# Patient Record
Sex: Female | Born: 1945 | Race: White | Hispanic: No | Smoking: Never smoker
Health system: Southern US, Community
[De-identification: ages and names within clinical notes are randomized; demographics above are authoritative.]

## PROBLEM LIST (undated history)

## (undated) DIAGNOSIS — J45909 Unspecified asthma, uncomplicated: Secondary | ICD-10-CM

## (undated) DIAGNOSIS — E538 Deficiency of other specified B group vitamins: Secondary | ICD-10-CM

## (undated) DIAGNOSIS — I639 Cerebral infarction, unspecified: Secondary | ICD-10-CM

## (undated) DIAGNOSIS — C7A090 Malignant carcinoid tumor of the bronchus and lung: Secondary | ICD-10-CM

## (undated) DIAGNOSIS — F419 Anxiety disorder, unspecified: Secondary | ICD-10-CM

## (undated) DIAGNOSIS — R112 Nausea with vomiting, unspecified: Secondary | ICD-10-CM

## (undated) DIAGNOSIS — Z8619 Personal history of other infectious and parasitic diseases: Secondary | ICD-10-CM

## (undated) DIAGNOSIS — K56609 Unspecified intestinal obstruction, unspecified as to partial versus complete obstruction: Secondary | ICD-10-CM

## (undated) DIAGNOSIS — R918 Other nonspecific abnormal finding of lung field: Secondary | ICD-10-CM

## (undated) DIAGNOSIS — K5289 Other specified noninfective gastroenteritis and colitis: Secondary | ICD-10-CM

## (undated) DIAGNOSIS — Z9889 Other specified postprocedural states: Secondary | ICD-10-CM

## (undated) DIAGNOSIS — K219 Gastro-esophageal reflux disease without esophagitis: Secondary | ICD-10-CM

## (undated) DIAGNOSIS — Z992 Dependence on renal dialysis: Secondary | ICD-10-CM

## (undated) DIAGNOSIS — C569 Malignant neoplasm of unspecified ovary: Secondary | ICD-10-CM

## (undated) DIAGNOSIS — N390 Urinary tract infection, site not specified: Secondary | ICD-10-CM

## (undated) DIAGNOSIS — N289 Disorder of kidney and ureter, unspecified: Secondary | ICD-10-CM

## (undated) DIAGNOSIS — K8050 Calculus of bile duct without cholangitis or cholecystitis without obstruction: Secondary | ICD-10-CM

## (undated) HISTORY — DX: Unspecified intestinal obstruction, unspecified as to partial versus complete obstruction: K56.609

## (undated) HISTORY — DX: Malignant neoplasm of unspecified ovary: C56.9

## (undated) HISTORY — PX: OTHER SURGICAL HISTORY: SHX169

## (undated) HISTORY — DX: Cerebral infarction, unspecified: I63.9

## (undated) HISTORY — PX: EXPLORATORY LAPAROTOMY W/ BOWEL RESECTION: SHX1544

## (undated) HISTORY — PX: LAPAROSCOPIC CHOLECYSTECTOMY: SUR755

## (undated) HISTORY — PX: TOTAL ABDOMINAL HYSTERECTOMY W/ BILATERAL SALPINGOOPHORECTOMY: SHX83

## (undated) HISTORY — PX: LUNG REMOVAL, PARTIAL: SHX233

## (undated) HISTORY — DX: Deficiency of other specified B group vitamins: E53.8

## (undated) HISTORY — DX: Anxiety disorder, unspecified: F41.9

## (undated) HISTORY — DX: Other specified noninfective gastroenteritis and colitis: K52.89

## (undated) HISTORY — PX: APPENDECTOMY: SHX54

## (undated) HISTORY — DX: Calculus of bile duct without cholangitis or cholecystitis without obstruction: K80.50

## (undated) HISTORY — DX: Gastro-esophageal reflux disease without esophagitis: K21.9

## (undated) HISTORY — PX: BREAST ENHANCEMENT SURGERY: SHX7

## (undated) HISTORY — DX: Personal history of other infectious and parasitic diseases: Z86.19

## (undated) HISTORY — DX: Other nonspecific abnormal finding of lung field: R91.8

## (undated) HISTORY — DX: Malignant carcinoid tumor of the bronchus and lung: C7A.090

---

## 2000-05-02 ENCOUNTER — Ambulatory Visit: Admission: RE | Admit: 2000-05-02 | Discharge: 2000-05-02 | Payer: Self-pay | Admitting: Gynecology

## 2000-05-02 ENCOUNTER — Other Ambulatory Visit: Admission: RE | Admit: 2000-05-02 | Discharge: 2000-05-02 | Payer: Self-pay | Admitting: Gynecology

## 2000-05-19 ENCOUNTER — Inpatient Hospital Stay (HOSPITAL_COMMUNITY): Admission: EM | Admit: 2000-05-19 | Discharge: 2000-05-20 | Payer: Self-pay | Admitting: Emergency Medicine

## 2000-05-19 ENCOUNTER — Encounter: Payer: Self-pay | Admitting: Emergency Medicine

## 2000-05-20 ENCOUNTER — Encounter: Payer: Self-pay | Admitting: Internal Medicine

## 2000-06-01 ENCOUNTER — Ambulatory Visit (HOSPITAL_COMMUNITY): Admission: RE | Admit: 2000-06-01 | Discharge: 2000-06-01 | Payer: Self-pay | Admitting: Internal Medicine

## 2000-06-01 ENCOUNTER — Encounter: Payer: Self-pay | Admitting: Internal Medicine

## 2000-06-14 ENCOUNTER — Encounter: Payer: Self-pay | Admitting: Internal Medicine

## 2000-06-14 ENCOUNTER — Ambulatory Visit (HOSPITAL_COMMUNITY): Admission: RE | Admit: 2000-06-14 | Discharge: 2000-06-14 | Payer: Self-pay | Admitting: Internal Medicine

## 2000-06-20 ENCOUNTER — Ambulatory Visit: Admission: RE | Admit: 2000-06-20 | Discharge: 2000-06-20 | Payer: Self-pay | Admitting: Gynecology

## 2000-09-12 ENCOUNTER — Ambulatory Visit: Admission: RE | Admit: 2000-09-12 | Discharge: 2000-09-12 | Payer: Self-pay | Admitting: Gynecology

## 2000-11-14 ENCOUNTER — Ambulatory Visit: Admission: RE | Admit: 2000-11-14 | Discharge: 2000-11-14 | Payer: Self-pay | Admitting: Gynecology

## 2001-01-30 DIAGNOSIS — K5289 Other specified noninfective gastroenteritis and colitis: Secondary | ICD-10-CM

## 2001-01-30 HISTORY — DX: Other specified noninfective gastroenteritis and colitis: K52.89

## 2001-02-15 ENCOUNTER — Encounter: Payer: Self-pay | Admitting: Internal Medicine

## 2001-02-18 ENCOUNTER — Encounter (INDEPENDENT_AMBULATORY_CARE_PROVIDER_SITE_OTHER): Payer: Self-pay | Admitting: *Deleted

## 2001-02-18 ENCOUNTER — Other Ambulatory Visit: Admission: RE | Admit: 2001-02-18 | Discharge: 2001-02-18 | Payer: Self-pay | Admitting: Internal Medicine

## 2001-02-21 ENCOUNTER — Ambulatory Visit (HOSPITAL_COMMUNITY): Admission: RE | Admit: 2001-02-21 | Discharge: 2001-02-21 | Payer: Self-pay | Admitting: Internal Medicine

## 2001-02-21 ENCOUNTER — Encounter: Payer: Self-pay | Admitting: Internal Medicine

## 2001-02-26 ENCOUNTER — Ambulatory Visit: Admission: RE | Admit: 2001-02-26 | Discharge: 2001-02-26 | Payer: Self-pay | Admitting: Gynecology

## 2001-03-05 ENCOUNTER — Encounter: Admission: RE | Admit: 2001-03-05 | Discharge: 2001-05-23 | Payer: Self-pay | Admitting: Gynecology

## 2001-08-15 ENCOUNTER — Encounter: Payer: Self-pay | Admitting: Internal Medicine

## 2001-08-15 ENCOUNTER — Ambulatory Visit (HOSPITAL_COMMUNITY): Admission: RE | Admit: 2001-08-15 | Discharge: 2001-08-15 | Payer: Self-pay | Admitting: Internal Medicine

## 2001-08-19 ENCOUNTER — Ambulatory Visit (HOSPITAL_COMMUNITY): Admission: RE | Admit: 2001-08-19 | Discharge: 2001-08-19 | Payer: Self-pay | Admitting: Internal Medicine

## 2004-10-05 ENCOUNTER — Ambulatory Visit: Payer: Self-pay | Admitting: Internal Medicine

## 2006-11-07 ENCOUNTER — Ambulatory Visit: Payer: Self-pay | Admitting: Internal Medicine

## 2006-11-07 LAB — CONVERTED CEMR LAB
ALT: 44 units/L — ABNORMAL HIGH (ref 0–40)
AST: 25 units/L (ref 0–37)
Albumin: 3.7 g/dL (ref 3.5–5.2)
Alkaline Phosphatase: 78 units/L (ref 39–117)
BUN: 17 mg/dL (ref 6–23)
Basophils Absolute: 0.1 10*3/uL (ref 0.0–0.1)
Basophils Relative: 1.1 % — ABNORMAL HIGH (ref 0.0–1.0)
Bilirubin, Direct: 0.2 mg/dL (ref 0.0–0.3)
CO2: 26 meq/L (ref 19–32)
Calcium: 9 mg/dL (ref 8.4–10.5)
Chloride: 107 meq/L (ref 96–112)
Creatinine, Ser: 1.2 mg/dL (ref 0.4–1.2)
Eosinophils Absolute: 0.1 10*3/uL (ref 0.0–0.6)
Eosinophils Relative: 1.4 % (ref 0.0–5.0)
GFR calc Af Amer: 59 mL/min
GFR calc non Af Amer: 49 mL/min
Glucose, Bld: 89 mg/dL (ref 70–99)
HCT: 38.4 % (ref 36.0–46.0)
Hemoglobin: 13.2 g/dL (ref 12.0–15.0)
Lymphocytes Relative: 35.9 % (ref 12.0–46.0)
MCHC: 34.5 g/dL (ref 30.0–36.0)
MCV: 90.2 fL (ref 78.0–100.0)
Monocytes Absolute: 0.4 10*3/uL (ref 0.2–0.7)
Monocytes Relative: 5.2 % (ref 3.0–11.0)
Neutro Abs: 4.3 10*3/uL (ref 1.4–7.7)
Neutrophils Relative %: 56.4 % (ref 43.0–77.0)
Platelets: 325 10*3/uL (ref 150–400)
Potassium: 4.1 meq/L (ref 3.5–5.1)
RBC: 4.25 M/uL (ref 3.87–5.11)
RDW: 13 % (ref 11.5–14.6)
Sodium: 139 meq/L (ref 135–145)
TSH: 1.76 microintl units/mL (ref 0.35–5.50)
Total Bilirubin: 0.7 mg/dL (ref 0.3–1.2)
Total Protein: 6.7 g/dL (ref 6.0–8.3)
Vitamin B-12: 488 pg/mL (ref 211–911)
WBC: 7.6 10*3/uL (ref 4.5–10.5)

## 2007-05-16 ENCOUNTER — Ambulatory Visit: Payer: Self-pay | Admitting: Internal Medicine

## 2007-05-16 LAB — CONVERTED CEMR LAB
BUN: 17 mg/dL (ref 6–23)
CO2: 24 meq/L (ref 19–32)
Calcium: 9.3 mg/dL (ref 8.4–10.5)
Chloride: 109 meq/L (ref 96–112)
Creatinine, Ser: 1.3 mg/dL — ABNORMAL HIGH (ref 0.4–1.2)
GFR calc Af Amer: 54 mL/min
GFR calc non Af Amer: 44 mL/min
Glucose, Bld: 114 mg/dL — ABNORMAL HIGH (ref 70–99)
Potassium: 4.2 meq/L (ref 3.5–5.1)
Sodium: 140 meq/L (ref 135–145)

## 2008-03-06 ENCOUNTER — Encounter: Payer: Self-pay | Admitting: Internal Medicine

## 2008-05-18 ENCOUNTER — Telehealth: Payer: Self-pay | Admitting: Internal Medicine

## 2008-07-31 ENCOUNTER — Ambulatory Visit: Admission: RE | Admit: 2008-07-31 | Discharge: 2008-07-31 | Payer: Self-pay | Admitting: Gynecology

## 2008-07-31 ENCOUNTER — Encounter (INDEPENDENT_AMBULATORY_CARE_PROVIDER_SITE_OTHER): Payer: Self-pay | Admitting: *Deleted

## 2008-07-31 ENCOUNTER — Encounter: Payer: Self-pay | Admitting: Internal Medicine

## 2008-10-19 ENCOUNTER — Telehealth: Payer: Self-pay | Admitting: Internal Medicine

## 2008-11-11 DIAGNOSIS — K8020 Calculus of gallbladder without cholecystitis without obstruction: Secondary | ICD-10-CM | POA: Insufficient documentation

## 2008-11-11 DIAGNOSIS — Z8543 Personal history of malignant neoplasm of ovary: Secondary | ICD-10-CM

## 2008-11-11 DIAGNOSIS — Z8719 Personal history of other diseases of the digestive system: Secondary | ICD-10-CM

## 2008-11-11 DIAGNOSIS — R197 Diarrhea, unspecified: Secondary | ICD-10-CM

## 2008-11-11 DIAGNOSIS — E538 Deficiency of other specified B group vitamins: Secondary | ICD-10-CM | POA: Insufficient documentation

## 2008-11-24 ENCOUNTER — Ambulatory Visit: Payer: Self-pay | Admitting: Internal Medicine

## 2008-11-26 ENCOUNTER — Telehealth: Payer: Self-pay | Admitting: Internal Medicine

## 2009-06-02 ENCOUNTER — Telehealth: Payer: Self-pay | Admitting: Internal Medicine

## 2009-08-11 ENCOUNTER — Encounter: Payer: Self-pay | Admitting: Internal Medicine

## 2010-03-22 ENCOUNTER — Telehealth: Payer: Self-pay | Admitting: Internal Medicine

## 2010-05-13 ENCOUNTER — Encounter: Payer: Self-pay | Admitting: Internal Medicine

## 2010-11-03 NOTE — Progress Notes (Signed)
Summary: Medication refill  Phone Note Call from Patient Call back at Home Phone [phone]   Caller: Patient Call For: Dr. Olevia Perches Reason for Call: Talk to Nurse Summary of Call: pt. is requesting a refill on her Cipro and Cyanocobalamin. She stated that last time she requested a refill that it was denied b/c she "is unemployed with no insurance and cannot afford a colonoscopy." Patient very angry. Initial call taken by: Webb Laws,  March 22, 2010 12:04 PM  Follow-up for Phone Call        Actually, looks like we did fill her medication and Dr Olevia Perches asked that she have a colonoscopy before the end of the year due to her increased risk for colon cancer due to radiation and complex medical history. So, I am unsure as to what medication denial she is referring to.   Dr Olevia Perches, please see note dated 11/26/08 and 08/11/09.Marland KitchenMarland KitchenMarland KitchenMarland Kitchenpatient's last office visit with [place] was on 11/24/08. Do you want me to continue giving medications even though patient is yet to have colonoscopy?  Follow-up by: Madlyn Frankel CMA Deborra Medina),  March 22, 2010 1:37 PM  Additional Follow-up for Phone Call Additional follow up Details #1::        OK to refill x 3 Additional Follow-up by: Lafayette Dragon MD,  March 23, 2010 10:22 PM    New/Updated Medications: CYANOCOBALAMIN 1000 MCG/ML SOLN (CYANOCOBALAMIN) Administer 1 ml intramuscularly once a month Prescriptions: CYANOCOBALAMIN 1000 MCG/ML SOLN (CYANOCOBALAMIN) Administer 1 ml intramuscularly once a month  #3 ml x 0   Entered by:   Madlyn Frankel CMA (AAMA)   Authorized by:   Lafayette Dragon MD   Signed by:   [place] ([place]) on 03/25/2010   Method used:   Electronically to        [facility] (retail)       [address] [place], [place]  [address]       Ph: [phone]       Fax: [phone]   RxID:   [id number] CIPRO 250 MG TABS (CIPROFLOXACIN HCL) Take 1 tablet by mouth two times a  day x 7 days (as directed)  #14 x 2   Entered by:   Madlyn Frankel CMA (AAMA)   Authorized by:   Lafayette Dragon MD   Signed by:   Madlyn Frankel CMA ([place]) on 03/25/2010   Method used:   Electronically to        [facility] (retail)       [place]       [place],   [address]       Ph: [phone]       Fax: [phone]   RxID:   [id number]

## 2010-11-03 NOTE — Miscellaneous (Signed)
Summary: Cholestyramine Refills  Clinical Lists Changes  Medications: Changed medication from QUESTRAN 4 GM PACK (CHOLESTYRAMINE) Dissolve in 8 ounces of water and drink every morning.  Take this at least 2 hours apart from other medications. to QUESTRAN 4 GM/DOSE POWD (CHOLESTYRAMINE) Dissolve in 8 ounces of water and drink every morning. Take 2 hours apart from all other medications. NEED OFFICE VISIT FOR FURTHER REFILLS! - Signed Rx of QUESTRAN 4 GM/DOSE POWD (CHOLESTYRAMINE) Dissolve in 8 ounces of water and drink every morning. Take 2 hours apart from all other medications. NEED OFFICE VISIT FOR FURTHER REFILLS!;  #1 can x 0;  Signed;  Entered by: Madlyn Frankel CMA (AAMA);  Authorized by: Lafayette Dragon MD;  Method used: Electronically to [facility] [place], [place], [place]  [address], Ph: [phone], Fax: [phone]    Prescriptions: QUESTRAN 4 GM/DOSE POWD (CHOLESTYRAMINE) Dissolve in 8 ounces of water and drink every morning. Take 2 hours apart from all other medications. NEED OFFICE VISIT FOR FURTHER REFILLS!  #1 can x 0   Entered by:   Madlyn Frankel CMA (AAMA)   Authorized by:   Lafayette Dragon MD   Signed by:   [place] ([place]) on 05/13/2010   Method used:   Electronically to        [facility] (retail)       [address]       [place], [place]  [address]       Ph: [phone]       Fax: [phone]   RxID:   [id number]

## 2010-11-18 ENCOUNTER — Telehealth: Payer: Self-pay | Admitting: Internal Medicine

## 2010-11-29 NOTE — Progress Notes (Signed)
Summary: Question about her prescription  Phone Note Call from Patient Call back at Home Phone [phone]   Call For: Dr Olevia Perches Action Taken: Appt Scheduled Summary of Call: Question about her prescriptions. Willing to speak to nurse if Dr Olevia Perches is too busy & cant call her back now Initial call taken by: Irwin Brakeman [facility],  November 18, 2010 11:23 AM  Follow-up for Phone Call        I have spoken to patient. She states that she is in a difficult financial position right now and is unable to afford an office visit and unable to afford her cholestyramine ($65.00 cost). She states that she is having severe diarrhea without the cholecystramine. Suggested she try immodium for the time being. She states that she has been taking immodium without any improvement in her symptoms. She would like to know if Dr Olevia Perches has any suggestions for cheaper alternatives or natural alternatives. Please advise. Follow-up by: Madlyn Frankel CMA Deborra Medina),  November 18, 2010 12:12 PM  Additional Follow-up for Phone Call Additional follow up Details #1::        anything in large amount OTC willbe expensive. peptobismol, Kaopectate, activated charcoal, all there may be tried.  Additional Follow-up by: Lafayette Dragon MD,  November 19, 2010 12:00 PM    Additional Follow-up for Phone Call Additional follow up Details #2::    left message on voicemail that patient may try otc kaopectacte, pepto bismol or activated charcoal although these may be expensive over the counter as well. Patient to call back with any questions. Follow-up by: Madlyn Frankel CMA Deborra Medina),  November 21, 2010 1:55 PM

## 2011-02-14 NOTE — Consult Note (Signed)
NAMEANTONAE, PORTS NO.:  [id number]   MEDICAL RECORD NO.:  [id number]          PATIENT TYPE:  OUT   LOCATION:  GYN                          FACILITY:  [facility]   PHYSICIAN:  Marti Sleigh, M.D.DATE OF BIRTH:  January 21, 1946   DATE OF CONSULTATION:  DATE OF DISCHARGE:                                 CONSULTATION   CHIEF COMPLAINT:  Chronic diarrhea.   A 65 year old white female seen in follow-up.  She has not been seen in  our clinic since 2005.  She returns today because she has insurance and  seeks continuing management of her diarrhea.   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  Postoperatively, she  was treated with whole abdomen and pelvic radiation therapy.  She has  remained with no evidence of disease, but since the 1980s, she has had  partial small-bowel obstructions.  She was admitted on several  occasions.  While she was managed conservatively, she worsened and  ultimately had obstruction and significant weight loss.  In December  2001, she underwent exploratory laparotomy with resection of the distal  small bowel with primary reanastomosis of the small bowel to the  ascending colon.  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  There was no evidence of cancer.   Since that time, she has had diarrhea and uses Lomotil on a daily basis,  as well as Questran.  She has modified her diet somewhat and finds that  she has a least amount of diarrhea when she eats a lot of [place] bread.   PAST SURGICAL HISTORY:  1. TAH-BSO.  2. Appendectomy.  3. Laparoscopic cholecystectomy in 1998.  4. Bilateral breast implants in 1984.  5. Exploratory laparotomy and small bowel resection in 2001.   FAMILY HISTORY:  Negative for gynecologic breast or colon cancer.   SOCIAL HISTORY:  The patient works in Aeronautical engineer.  She does  not smoke and is not  married.   DRUG ALLERGIES:  NONE.   CURRENT MEDICATIONS:  Lomotil and Questran.   REVIEW OF SYSTEMS:  A 10-point comprehensive review of systems negative  except as noted above.   PHYSICAL EXAMINATION:  VITAL SIGNS:  Weight 136 pounds.  GENERAL:  The patient is a healthy white female in no acute distress.  HEENT:  Negative.  NECK:  Supple without thyromegaly.  There is no supraclavicular or  inguinal adenopathy.  ABDOMEN:  Soft and nontender.  No mass, organomegaly, ascites or hernias  noted.  PELVIC:  EG/BUS, vagina and urethra are normal.  Cervix and uterus  surgically absent.  There is some radiation changes and fibrosis in the  pelvis and fibrosis of the mons and lower abdomen, consistent with  radiation changes as well.  Rectovaginal exam confirms.  The patient has  some hemorrhoids.  EXTREMITIES:  Lower extremities without edema or varicosities.   IMPRESSION:  1. Chronic diarrhea, ranging from 6-20 episodes a day.  She is      somewhat better when she  takes Lomotil or eat white bread.  She has      restricted her diet and is not eating green vegetables, fried foods      of milk products.  2. She also has some menopausal symptoms.   RECOMMENDATIONS:  I suggest the patient have a bone density study.  She  is interested in reinitiating hormone replacement therapy and she was  given a prescription for a Vivelle dot 0.1 mg to be changed twice a  week.  She has annual mammograms which she claims are negative.   With regard to her diarrhea, I think there are several approaches that  we could take that might empirically help her.  On further questioning,  she indicates that she had been given Cipro previously and has had  improvement of her diarrhea.  I would therefore suggest that she takes  Cipro 1 tablet (250 mg) once a day for the next month, and see if this  helps with her diarrhea.  Sometimes change in bowel flora created by her  surgery and extensive radiation therapy  improves diarrhea.   It that does not work, we can certainly consider using probiotics and  would also consider using benefiber.   The patient will contact us and let us know how she is doing.      Marti Sleigh, M.D.  Electronically Signed     DC/MEDQ  D:  07/31/2008  T:  08/01/2008  Job:  [id number]   cc:   Caswell Corwin, R.N.  [address], [place] [address]   Lowella Bandy. Olevia Perches, [place]  [place] [address]

## 2011-02-17 NOTE — Consult Note (Signed)
[facility]  Patient:    Desiree Adams, Desiree Adams                        MRN: [id number] Proc. Date: 05/02/00 Adm. Date:  [id number] Attending:  Alvino Chapel CC:         Caswell Corwin, R.N.  Heinz Knuckles Norins, M.D. [facility]  Dora M. Olevia Perches, M.D. [facility]   Consultation Report  CHIEF COMPLAINT:  This 65 year old white female returns for continuing gynecologic tear.  She has a remote history of undergoing whole abdomen radiation therapy in 1969, for ovarian cancer.  She has been plaqued by intermittent small bowel obstruction and radiation enteritis ever since.  HISTORY OF PRESENT ILLNESS:  Today she reports that her enteritis is better, especially when she minimizes the amount of fiber in her diet.  She is not using Lomotil.  She has intermittent diarrhea, but this is not severe.  Approximately  three times a year she has a bout of a partial small bowel obstruction, which she manages conservatively.  She is using Levsin p.r.n. abdominal cramping.  The patient also notes some vaginal bleeding after intercourse, and some burning. / She is taking Estratest HS, and is using a vaginal lubricant (Replens).  CURRENT MEDICATIONS: 1. Levsin p.r.n. 2. Estratest one q.h.s.  ALLERGIES:  No known drug allergies.  REVIEW OF SYSTEMS:  Essentially negative except as noted in the history of present illness.  FAMILY HISTORY/SOCIAL HISTORY:  Reviewed and unchanged.  PHYSICAL EXAMINATION:  VITAL SIGNS:  Weight 147 pounds, blood pressure 110/68, pulse 60, respirations 0.  GENERAL:  The patient is a healthy young woman, in no acute distress.  HEENT:  Negative.  NECK:  Supple, without thyromegaly.  There is no supraclavicular or inguinal adenopathy.  ABDOMEN:  Soft, nontender.  No masses, organomegaly, ascites, or hernia noted.  BREASTS:  Without masses or discharge or skin changes.  Both implants appear to be normal.  PELVIC:  EG, BUS normal.  Vagina is  atrophic, clean, and no lesions are noted.  Bimanual and rectovaginal examination reveals some fullness in the pelvis, suggestive of some dilated loops of small bowel.  This is unchanged from prior examinations.  RECTOVAGINAL:  Examination confirms.  Pap smears are obtained.  IMPRESSION: 1. History of ovarian cancer, treated with surgery and radiation therapy.    No evidence of recurrent disease. 2. 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 3. Vaginal atrophy, leading to vaginal bleeding.  Pap smears are obtained.  The patient is given a prescription for Premarin vaginal cream, to be used twice a week.  She is also given renewed prescriptions for Levsin 1.25 mg q.4h. p.r.n. abdominal cramping, and Estratest HS.  She will arrange to have mammograms done in the near future at [facility].  She is given a sample of Astroglide for vaginal lubrication, and will return to see me in one year for continuing followup. DD:  05/02/00 TD:  05/02/00 Job: [id number]

## 2011-02-17 NOTE — Consult Note (Signed)
[facility]  Patient:    Desiree Adams, Desiree Adams                        MRN: [id number] Proc. Date: 09/12/00 Adm. Date:  [id number] Disc. Date: [id number] Attending:  Alvino Chapel CC:         Lowella Bandy. Olevia Perches, M.D. [facility]  Caswell Corwin, R.N.   Consultation Report  HISTORY:  17 white female returns for her first postoperative checkup, having undergone an exploratory laparotomy, resection of distal small bowel with primary reanastomosis, small bowel to ascending colon, on August 09, 2000 at [facility].  She was found to have severe radiation enteritis and partial small-bowel obstruction; final pathology confirmed this.  There was no evidence of recurrent ovarian cancer. There were ischemic changes of the small bowel mucosa and severe adhesions. The patient has had an uncomplicated postoperative course.  Her only problem is that of diarrhea of up to six stools a day.  She overall feels well, she is very happy that she is not vomiting any further, has no abdominal distention or abdominal pain.  Her functional status is excellent.  PHYSICAL EXAMINATION  VITAL SIGNS:  Weight 126-1/2 pounds.  Blood pressure 120/72.  GENERAL:  The patient is a healthy white female in no acute distress.  HEENT:  Negative.  NECK:  Supple without thyromegaly.  ABDOMEN:  Soft and nontender.  The midline is well-healed.  PELVIC:  EG/BUS normal.  Vagina is clean, well-supported.  Bimanual and rectovaginal exams reveal no mass, induration or nodularity.  IMPRESSION:  Status post small bowel resection for chronic radiation enteritis and partial obstruction.  The patient has had good resolution of her obstructive symptoms.  She is encouraged to increase her dose of Imodium for the diarrhea.  She is also given a prescription for Questran to be used twice a day for diarrhea.  If the problem does not resolve in the next month or so, I will have  the patient return to see Dr. Lowella Bandy. Brodie for her input. DD:  09/18/00 TD:  09/19/00 Job: [id number]

## 2011-02-17 NOTE — Consult Note (Signed)
[facility]  Patient:    Desiree Adams, Desiree Adams                        MRN: [id number] Proc. Date: 02/26/01 Adm. Date:  [id number] Disc. Date: [id number] Attending:  Alvino Chapel CC:         Lowella Bandy. Olevia Perches, M.D. [facility]  Caswell Corwin, R.N.   Consultation Report  HISTORY OF PRESENT ILLNESS:  A 65 year old white female returns for continued follow-up.  In November 2001, she underwent resection of distal small bowel with primary reanastomosis for chronic radiation enteritis and intermittent partial small bowel obstruction, which had been worsening over the prior years.  She had a significant amount of diarrhea following that procedure and also was found to have a Clostridia difficile culture, which was treated with Flagyl.  The patient reports that the diarrhea has improved significantly. She continues to have some diarrhea but this seems to be reasonably well controlled with her current regimen.  Her biggest problem is that of rectal pain.  She has been evaluated by Dr. [name], who performed flexible sigmoidoscopy on May 17.  At that time, biopsies were obtained with the impression being a noninfectious colitis.  In addition, a CAT scan of the pelvis has been obtained showing some perirectal stranding but no signs of obstruction or significant rectal wall thickening.  The patient has no other complaints.  PHYSICAL EXAMINATION:  VITAL SIGNS:  Weight 125 pounds, blood pressure 110/60.  ABDOMEN:  Soft and nontender.  No masses, organomegaly, ascites, or hernias are noted.  Her incision is well healed.  PELVIC:  EGBUS normal.  The vagina is clean but atrophic.  Bimanual and rectovaginal exam reveal no masses, induration, or nodularity.  She does have a considerable amount of tenderness in the perianal area.  IMPRESSION: 1. Past history of ovarian cancer, status post resection and whole abdomen    radiation therapy.  No evidence of recurrent  disease. 2. Radiation-induced small bowel obstruction, status post resection and    reanastomosis November 2001. 3. Chronic rectal pain of questionable etiology.  PLAN:  The patient will continue taking Questran, her antidiarrheal medications, and hydrocortisone foam.  She also is taking Vioxx 25 mg daily.  We will refer the patient to physical therapy to see if they can assist with her pain control.  She will return to see me in six months to continue follow-up. DD:  03/05/01 TD:  03/05/01 Job: [id number]

## 2011-02-17 NOTE — Assessment & Plan Note (Signed)
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  She  also has a history of biliary colic and partial small bowel obstruction  in 2001.  We have been treating her for chronic diarrhea and B12  deficiency.  The patient had laparoscopic cholecystectomy in 1998.  Last  colonoscopy was done in May 2002 and showed no specific colitis in the  colon, most likely radiation.  Her diarrhea has much improved over the  past several years.  She has been able to cut back on her Imodium from 8  to 4 tablets a day.  She still continues Questran 4 gm daily and  Paregoric 1 teaspoon about once a week or every 2 weeks.  She uses  Calmoseptine  ointment for rectal irritation and she has continued on a  low residue diet and B12 supplements 1000 mcg monthly, which she self  administers.  The patient has been on Flagyl 250 mg 3 times a day every  few months for bacteria overgrowth.  She has been able to work full-time  and has been quite satisfied with her progress.   PHYSICAL EXAMINATION:  Blood pressure 118/66, pulse 78 and weight 142  pounds.  She actually appeared healthy, in no distress.  She gained about 6  pounds since last visit.  LUNGS:  Clear to auscultation.  CORE:  With normal S1, S2.  ABDOMEN:  Was soft with normal active bowel sounds, no tenderness.  Surgical scar slow healed.  RECTAL:  Not done today.  EXTREMITIES:  No edema.   IMPRESSION:  61. 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 A history of B12 deficiency.   PLAN:  1. CBC, B12,  CMET today.  2. Refills on all of her medications.  3. Samples of Calmoseptine as well as Align to take as a probiotic to      normalize a bacterial flora.  I will see her again in 1 year.  4. She will be due for colonoscopy in near future     Desiree M. Olevia Perches, MD  Electronically Signed    DMB/MedQ  DD: 11/07/2006  DT: 11/07/2006  Job #: [id number]

## 2011-02-17 NOTE — Discharge Summary (Signed)
[facility]  Patient:    Desiree Adams, Desiree Adams                        MRN: [id number] Adm. Date:  [id number] Disc. Date: [id number] Attending:  Vincent Peyer Dictator:   Nicoletta Ba, P.A.-C. CC:         Cherly Anderson. Clarke-Pearson, M.D.   Discharge Summary  ADMITTING DIAGNOSES: 1. 65 year-old white female with partial recurrent small-bowel    obstruction felt secondary to adhesions. 2. History of ovarian cancer status post surgical removal and radiation,    approximately 30 years ago. 3. History of recurrent partial small-bowel obstructions. 4. Status post cholecystectomy. 5. Nodule right lower lobe lung, question new versus old finding. 6. Dehydration secondary to nausea and vomiting.  DISCHARGE DIAGNOSES: 1. Resolved partial small-bowel obstruction likely secondary to adhesions. 2. History of recurrent partial small-bowel obstructions. 3. History of ovarian cancer status post total abdominal hysterectomy and    bilateral salpingo-oophorectomy with radiation therapy approximately 30    years ago. 4. Right lower lobe pulmonary nodule, unclear if this is a new versus old    finding and will need follow-up CT scan as an outpatient. 5. Dehydration resolved.  CONSULTATIONS:  None.  PROCEDURES:  Abdominal films.  HISTORY OF PRESENT ILLNESS:  Desiree Adams is a pleasant 65 year old white female with history as outlined above.  She is known to Dr. [name], also followed by Dr. Fermin Schwab for gyn.  She underwent surgery approximately 30 years ago for an ovarian CA, had radiation, and has had problems with adhesions, though she has never required laparotomy for lysis of adhesions.  She says she overall has been doing well lately, though about three to four times a year she will have an episode for which she usually does not seek medical care but develops abdominal pain, nausea and vomiting, which usually resolves within 24 hours.  She generally  manages these herself at home with a liquid diet.  She says the last episode she had was about a month ago and prior to that, it had been many months.  At this time, she developed symptoms about three days prior to admission with abdominal pain and distention which was partially relieved by intermittent large volume vomiting.  She had no associated fever or chills, has had some mild loose stools, no melena or hematochezia.  Due to progressive pain, continued distention and intermittent vomiting, she came to the emergency room for evaluation.  Abdominal films were consistent with partial small-bowel obstruction, and she is admitted to the hospital for IV fluid hydration, antiemetics, and medical management.  LABORATORY STUDIES:  On admission showed a WBC of 9.6, hemoglobin 15.2, hematocrit 43.4, MCV of 87, platelet count 297, amylase was 54.  Electrolytes sodium 137, potassium 3.7, BUN 24, creatinine 1.4, calcium 9.1.  Follow-up on August 19, WBC 5.3, hemoglobin 12.7, hematocrit 36.1, platelet count 287, sodium 139, potassium 3.6, BUN 18, creatinine 1.2.  TSH is 1.16, iron 75, TIBC 305, percent saturation of 25.  Urinalysis negative.  A CA-125 was ordered and is pending.  X-RAY STUDIES:  KUB on admission and chest x-ray on admission shows a 2 cm pulmonary nodule on the right lower lobe with somewhat lobular borders.  No internal calcifications, cannot exclude bronchogenic carcinoma, suggest CT for follow-up.  KUB shows borderline dilated loop of small bowel on the right abdomen consistent with early partial small-bowel obstruction.  Follow-up KUB on August 19, showed no dilated  bowel loops.  HOSPITAL COURSE:  The patient was admitted to the service of Dr. [name], who was covering the hospital.  She was initially kept n.p.o., placed on IV fluids for dehydration with slightly elevated BUN.  She was given Demerol and Phenergan as needed for control of pain and nausea.  Baseline labs  were obtained as was follow-up CA-125.  The patient had a benign hospital course, did have some vomiting and diarrhea after admission but by the following morning was feeling a lot better and said her pain was resolved, and she was ready to go home.  She said she had some residual soreness in the left mid quadrant which is usual for her.  She was concerned with a lengthy hospital stay, as she had recently been laid off from her job and had lost her insurance.  She was to be scheduled for a CT scan of the abdomen, pelvis, and chest on August 20.  She was rather insistent on discharge to home.  We advanced her diet.  She tolerated this without difficulty.  Her follow-up KUB did show resolution of the partial small-bowel obstruction, and it was felt that she was stable for discharge to home.  She was aware of the right lower lobe pulmonary nodule and this would need follow-up.  She stated that she would have insurance reinstated within the next couple of weeks and would like to wait until that time to schedule the CT scans.  We also asked her to attempt to find prior chest x-rays for comparison.  At the time of discharge, she was discharged home on a low-residue diet as tolerated.  DISCHARGE MEDICATIONS:  Levsin sublingual p.r.n. as per previous, and Premarin as previous, and Prevacid 30 mg p.o. q.d. as needed.  FOLLOW-UP:  The patient was to schedule an appointment with Dr. [name] within the next four to six weeks and when her insurance is reinstated, hopefully within the next could of weeks, also to call the office to get rescheduled for the CT scan of the abdomen, pelvis, and chest.  CONDITION ON DISCHARGE:  Stable. DD:  05/20/00 TD:  05/21/00 Job: [id number]

## 2011-02-17 NOTE — Consult Note (Signed)
[facility]  Patient:    Desiree Adams, Desiree Adams                        MRN: [id number] Proc. Date: 11/14/00 Adm. Date:  [id number] Attending:  Alvino Chapel                          Consultation Report  HISTORY OF PRESENT ILLNESS:  The patient is a 65 year old white female returns for continuing followup.  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  Since her last visit she has continued to have diarrhea.  She uses Imodium 8 to 12 tablets a day, but still has 6 to 8 loose stools daily.  I had recommended Questran previously, but the patient apparently has been using Citrucel p.r.n. without any relief.  Her appetite has been good.  She denies any fever or chills, or any blood in the stool.  She has no nausea or vomiting, or any other constitutional symptoms.  REVIEW OF SYSTEMS:  Otherwise negative.  She does have a past history of ovarian cancer over 20 years ago.  PHYSICAL EXAMINATION:  VITAL SIGNS:  Weight 128 pounds (up 2 pounds since December 2001.  It is noted that her prior weight was in the range of 135 to 140 pounds).  ABDOMEN:  Soft, nontender, no mass, organomegaly, no ascites, or hernias are noted.  IMPRESSION:  Chronic diarrhea, status post small bowel resection.  The patient is given a prescription for Questran to take b.i.d.  We will also obtain a stool specimen for clostridium, and we would like her to make an appointment to see Dr. [name] to have Dr. Diona Browner input as to management of her diarrhea.  She will return to see me in three months. DD:  11/14/00 TD:  11/15/00 Job: [id number]

## 2011-02-17 NOTE — Consult Note (Signed)
[facility]  Patient:    Desiree Adams, Desiree Adams                        MRN: [id number] Proc. Date: 06/24/00 Adm. Date:  [id number] Disc. Date: [id number] Attending:  Alvino Chapel CC:         Lowella Bandy. Olevia Perches, M.D. [facility]  Caswell Corwin, R.N.   Consultation Report  HISTORY OF PRESENT ILLNESS:  This 65 year old white female returns for continuing follow-up of ovarian cancer initially diagnosed in 1969 and subsequently treated with whole abdomen radiation therapy.  The patient has had chronic intermittent small bowel obstruction and radiation enteritis.  Most recently, she was admitted under the care of Dora M. Olevia Perches, M.D., between May 19, 2000, and May 20, 2000, for a recurrent partial small bowel obstruction.  This resolved spontaneously with conservative management. She has undergone further imaging studies, including a chest x-ray which shows a 2 cm right lower lobe pulmonary nodule and abdominal films showing a single dilated loop of small bowel.  A CT scan of the abdomen and chest showed an 18 mm solitary pulmonary nodule in the right lower lung.  Apparently follow-up x-ray comparison with prior studies is being carried out by Lowella Bandy. Olevia Perches, M.D., and the radiology department.  With regard to her abdominal symptoms, the CT showed diffuse stranding within multiple small bowel loops in the lower abdomen and pelvis.  There is no evidence of recurrent ovarian cancer.  She also has had a small bowel follow through showing a normal small bowel transit time with no small bowel dilatation (June 14, 2000).  There is no focal stricturing demonstrated, although there does appear to be several loops of narrowed ileum in the central and right pelvis.  The patient has been treated by Lowella Bandy. 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  The patient has also lost  at least 10 pounds since her visit with me in August of 2001.  Currently she feels better.  She has not had any episodes of GI problems over the past five to six days.  She denies any fever or chills and is not having any diarrhea.  She has not noted any blood in her bowel movements.  Her appetite is improving slightly, although she remains on a modified diet.  REVIEW OF SYSTEMS:  No cardiovascular, pulmonary, or neurologic symptoms.  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  PHYSICAL EXAMINATION:  Weight 137 pounds (down 10 pounds since August).  VITAL SIGNS:  Blood pressure 100/70.  GENERAL APPEARANCE:  The patient is a pleasant, healthy, white female in no acute distress.  HEENT:  Negative.  NECK:  Supple without thyromegaly.  LYMPHATICS:  There is no supraclavicular or inguinal adenopathy.  ABDOMEN:  Soft and nontender.  No masses, organomegaly, ascites, or hernias noted.  She has a well healed incision.  PELVIC:  EG and BUS normal.  The vagina is clean and slightly atrophic. Bimanual and rectovaginal exam reveal no masses, induration, or nodularity.  IMPRESSION: 1. Status post radiation therapy for ovarian cancer in 1969.  No evidence of    recurrent disease. 2. 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  Over the past couple of months it seems that the patients symptoms have worsened and  she has had a considerable amount of weight loss.  I had a lengthy discussion with the patient regarding these findings.  To date, we have been able to manage this medically and in a conservative fashion. However, if her symptomatology becomes more frequent and her weight loss does not stabilize, it may be necessary to strongly consider exploratory laparotomy with planned resection or bypass of her severely injury small  intestine.  The patient is made clearly aware of the risks of such surgery given that we would be operating on the bowel.  Further, following resection the patient may ultimately end up with a short bowel syndrome, which may mimic many of the same symptoms that she currently has with diarrhea, abdominal cramping, etc.  For the time being, we will continue to observe the patient and follow Dr. Lowella Bandy. Brodies plan of medical management.  I did indicate to the patient that if she needed surgery, I would be happy to do it at [facility] where the oncology team can assist and participate in her follow-up.  On the other hand, if she wished to have surgery in [place], [place], we would arrange for a general surgeon to manage her here. DD:  06/24/00 TD:  06/24/00 Job: [id number]

## 2011-10-03 HISTORY — PX: COLOSTOMY: SHX63

## 2011-12-01 ENCOUNTER — Telehealth: Payer: Self-pay | Admitting: Internal Medicine

## 2011-12-01 NOTE — Telephone Encounter (Signed)
Patient wants to schedule an OV to see Dr. Olevia Perches for a "black spot at my anus." States is a black spot at the area where stool pools due to her diarrhea. Scheduled patient on 12/13/11 at 2:30 PM with Dr. Olevia Perches.

## 2011-12-07 ENCOUNTER — Encounter: Payer: Self-pay | Admitting: *Deleted

## 2011-12-13 ENCOUNTER — Other Ambulatory Visit (INDEPENDENT_AMBULATORY_CARE_PROVIDER_SITE_OTHER): Payer: Medicare Other

## 2011-12-13 ENCOUNTER — Ambulatory Visit (INDEPENDENT_AMBULATORY_CARE_PROVIDER_SITE_OTHER): Payer: Medicare Other | Admitting: Internal Medicine

## 2011-12-13 ENCOUNTER — Encounter: Payer: Self-pay | Admitting: Internal Medicine

## 2011-12-13 VITALS — BP 112/72 | HR 80 | Ht 62.0 in | Wt 125.8 lb

## 2011-12-13 DIAGNOSIS — R638 Other symptoms and signs concerning food and fluid intake: Secondary | ICD-10-CM

## 2011-12-13 DIAGNOSIS — R197 Diarrhea, unspecified: Secondary | ICD-10-CM

## 2011-12-13 DIAGNOSIS — K520 Gastroenteritis and colitis due to radiation: Secondary | ICD-10-CM

## 2011-12-13 DIAGNOSIS — K633 Ulcer of intestine: Secondary | ICD-10-CM

## 2011-12-13 DIAGNOSIS — R6889 Other general symptoms and signs: Secondary | ICD-10-CM

## 2011-12-13 LAB — VITAMIN B12: Vitamin B-12: 160 pg/mL — ABNORMAL LOW (ref 211–911)

## 2011-12-13 LAB — CBC WITH DIFFERENTIAL/PLATELET
Basophils Absolute: 0 10*3/uL (ref 0.0–0.1)
Basophils Relative: 0.8 % (ref 0.0–3.0)
HCT: 37.9 % (ref 36.0–46.0)
Hemoglobin: 12.6 g/dL (ref 12.0–15.0)
Lymphocytes Relative: 41.7 % (ref 12.0–46.0)
Lymphs Abs: 2.6 10*3/uL (ref 0.7–4.0)
Monocytes Relative: 7.8 % (ref 3.0–12.0)
Neutro Abs: 2.9 10*3/uL (ref 1.4–7.7)
RBC: 4.15 Mil/uL (ref 3.87–5.11)
RDW: 13.9 % (ref 11.5–14.6)

## 2011-12-13 LAB — COMPREHENSIVE METABOLIC PANEL
AST: 20 U/L (ref 0–37)
Albumin: 3.6 g/dL (ref 3.5–5.2)
Alkaline Phosphatase: 61 U/L (ref 39–117)
Chloride: 108 mEq/L (ref 96–112)
Glucose, Bld: 90 mg/dL (ref 70–99)
Potassium: 3.8 mEq/L (ref 3.5–5.1)
Sodium: 140 mEq/L (ref 135–145)
Total Protein: 6.7 g/dL (ref 6.0–8.3)

## 2011-12-13 LAB — TSH: TSH: 1.74 u[IU]/mL (ref 0.35–5.50)

## 2011-12-13 MED ORDER — PEG-KCL-NACL-NASULF-NA ASC-C 100 G PO SOLR: 1.0000 | Freq: Once | ORAL | Status: DC

## 2011-12-13 MED ORDER — HYDROCORTISONE ACE-PRAMOXINE 2.5-1 % RE CREA: TOPICAL_CREAM | Freq: Three times a day (TID) | RECTAL | Status: AC | PRN

## 2011-12-13 MED ORDER — METRONIDAZOLE 250 MG PO TABS: 250.0000 mg | ORAL_TABLET | Freq: Three times a day (TID) | ORAL | Status: AC

## 2011-12-13 MED ORDER — HYDROCODONE-ACETAMINOPHEN 5-325 MG PO TABS: ORAL_TABLET | ORAL | Status: DC

## 2011-12-13 NOTE — Patient Instructions (Signed)
You have been scheduled for a colonoscopy. Please follow written instructions given to you at your visit today. We will give you 1/2 the normal dose of Moviprep due to Diarrhea (as per Dr [name]) Please pick up your prep kit at the pharmacy within the next 1-3 days. We have sent the following medications to your pharmacy for you to pick up at your convenience: Flagyl 250 three times daily Analpram Hydrocodone Your physician has requested that you go to the basement for the following lab work before leaving today: CBC, CMET, B12, IBC, TSH

## 2011-12-13 NOTE — Progress Notes (Signed)
Desiree Adams June 07, 1946 MRN [id number]  History of Present Illness:  This is a 66 year old white female with radiation enteritis and chronic diarrhea of over 30 years duration since being treated for ovarian cancer in 1969 at [facility]. She underwent a TAH and BSO. She was hospitalized for small bowel obstruction in 2001 and had laparoscopic cholecystectomy in 1998. She has a history of B12 deficiency. Her last flexible sigmoidoscopy in 2002 showed nonspecific changes in the rectosigmoid. Her last full colonoscopy in 2002 showed a hyperplastic polyp. She has been having 10-20 bowel movements a day and at night. She had no insurance last year  so  she did not want to see me. Her last appointment was February 2010. She has noticed dark discoloration of the skin at the rectal opening. It burns at night. She has some incontinence and perianal irritation and maceration of the skin due to frequent stools. We have discussed an ileostomy in the past but she was reluctant to consider that option.   Past Medical History  Diagnosis Date  . Ovarian cancer   . Small bowel obstruction   . 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 History of Clostridium difficile infection   . Vitamin B12 deficiency   . Biliary colic    Past Surgical History  Procedure Date  . Total abdominal hysterectomy w/ bilateral salpingoophorectomy   . Appendectomy   . Laparoscopic cholecystectomy   . Breast enhancement surgery   . Exploratory laparotomy w/ bowel resection     reports that she has never smoked. She has never used smokeless tobacco. She reports that she does not drink alcohol or use illicit drugs. family history includes Kidney cancer in an unspecified family member.  There is no history of Colon cancer. Allergies  Allergen Reactions  . Sulfamethoxazole     REACTION: unspecified        Review of Systems: Denies nausea or vomiting  any signs of obstruction. Denies rectal bleeding  The remainder of the 10 point ROS is negative except as outlined in H&P   Physical Exam: General appearance  Well developed, in no distress. Eyes- non icteric. HEENT nontraumatic, normocephalic. Mouth no lesions, tongue papillated, no cheilosis. Neck supple without adenopathy, thyroid not enlarged, no carotid bruits, no JVD. Lungs Clear to auscultation bilaterally. Cor normal S1, normal S2, regular rhythm, no murmur,  quiet precordium. Abdomen: Scaphoid soft abdomen with normal active bowel sounds. Mild diffuse tenderness. No distention. No bruit or mass. Rectal: Severely macerated perianal area in the perineum at least 8 cm in diameter. There is necrotic ulcer at the anal orifice which measured 2 cm in diameter and shows nodularity and grayish discoloration due to tissue necrosis.. It extended into the anal canal. Digital exam itself is very tender and I did not attempt anoscopic exam because it was very painful. Her liquid stool was Hemoccult-positive. There were no hemorrhoids. Extremities no pedal edema. Skin no lesions. Neurological alert and oriented x 3. Psychological normal mood and affect.  Assessment and Plan  Problem #1 Rectal ulcer consistent of necrotic tissue likely secondary to severe diarrhea and maceration of the rectal mucosa. I don't see any active purulent drainage but there is some odor which may indicate anaerobic infection. She needs an ileostomy to divert the stool flow.. She also needs a colonoscopy to assess the extent of the ulceration area. She is willing now  to consider ileostomy. She has radiation enteritis which may  preclude reversal of the ileostomy. I am told that an ileostomy will be surgically possible considering radiation induced fibrosis of the bowl. For now, she will start Flagyl 250 mg 3 times a day and continue probiotic. We will also use Analpram cream 2.5% 3 times a day. We will try to arrange for  Paregoric if it is available. It  reduced the diarrhea in the past. She will have followup labs today including iron levels and B12 blood counts as well as metabolic panel. She will be scheduled for a colonoscopy with limited prep.  12/13/2011 [name]

## 2011-12-14 ENCOUNTER — Ambulatory Visit (INDEPENDENT_AMBULATORY_CARE_PROVIDER_SITE_OTHER): Payer: Medicare Other | Admitting: Internal Medicine

## 2011-12-14 ENCOUNTER — Telehealth: Payer: Self-pay | Admitting: *Deleted

## 2011-12-14 DIAGNOSIS — E538 Deficiency of other specified B group vitamins: Secondary | ICD-10-CM

## 2011-12-14 DIAGNOSIS — N289 Disorder of kidney and ureter, unspecified: Secondary | ICD-10-CM

## 2011-12-14 MED ORDER — CYANOCOBALAMIN 1000 MCG/ML IJ SOLN
1000.0000 ug | Freq: Once | INTRAMUSCULAR | Status: AC
  Administered 2011-12-14: 1000 ug via INTRAMUSCULAR

## 2011-12-14 MED ORDER — CYANOCOBALAMIN 1000 MCG/ML IJ SOLN: INTRAMUSCULAR | Status: DC

## 2011-12-14 NOTE — Telephone Encounter (Signed)
Spoke with patient. She would rather wait until she tries the hydrocodone to see if it helps before taking paregoric. Patient states that she will call back if the hydrocodone does not help.

## 2011-12-14 NOTE — Telephone Encounter (Signed)
Message copied by Larina Bras on Thu Dec 14, 2011  5:02 PM ------      Message from: Lafayette Dragon      Created: Thu Dec 14, 2011  4:53 PM      Regarding: Paregoric liquid       Yes, Paregoric  Comes only in a liquid form. It may be expensive. But she ought to have it considering severity of her condition and the fact that her renal function has deteriorated likely from chronic dehydration, Disp 8-10 oz??, 1 tsp po q 8 hrs prn diarrhea      ----- Message -----         From: Larina Bras, CMA         Sent: 12/14/2011  10:00 AM           To: Lafayette Dragon, MD            Dr Olevia Perches-      I called around.... It doesn't appear that any chain pharmacies have paragoric. However, [facility] is able to get (or compound) the paragoric liquid. Would you like me to send an rx of this for Ms Heelan?

## 2011-12-14 NOTE — Telephone Encounter (Signed)
Please call pt: very low B12 140. Must come in today for B12 injection 1000 ug IM weekly x 4 then monthly x 6 ,then recheck. Also renal function abnormal, could be due to dehydration. Please drink large amount of Gatorade every day, recheck renal profile in 1 wek when she comes for the B12 shot.      Spoke with patient and gave her results and recommendations. Labs in EPIC. She will come today at 4 PM for injection. She will drink Gatorade and increase her fluids and then repeat labs next. Week.

## 2011-12-22 ENCOUNTER — Telehealth: Payer: Self-pay | Admitting: *Deleted

## 2011-12-22 ENCOUNTER — Other Ambulatory Visit (INDEPENDENT_AMBULATORY_CARE_PROVIDER_SITE_OTHER): Payer: Medicare Other

## 2011-12-22 DIAGNOSIS — N289 Disorder of kidney and ureter, unspecified: Secondary | ICD-10-CM

## 2011-12-22 LAB — RENAL FUNCTION PANEL
CO2: 25 mEq/L (ref 19–32)
Chloride: 104 mEq/L (ref 96–112)
GFR: 48.39 mL/min — ABNORMAL LOW (ref >60.00)
Sodium: 139 mEq/L (ref 135–145)

## 2011-12-22 NOTE — Telephone Encounter (Signed)
Spoke with patient and she state she does not remember talking about repeat labs but she will try to come by today and have it drawn.

## 2011-12-22 NOTE — Telephone Encounter (Signed)
Message copied by Hulan Saas on Fri Dec 22, 2011  9:00 AM ------      Message from: Hulan Saas      Created: Thu Dec 14, 2011  9:43 AM       Did patient have renal panel for DB on 3/21

## 2011-12-25 ENCOUNTER — Ambulatory Visit (HOSPITAL_COMMUNITY)
Admission: RE | Admit: 2011-12-25 | Discharge: 2011-12-25 | Disposition: A | Discharge status: Home Health | Payer: Medicare Other | Source: Ambulatory Visit | Attending: Internal Medicine | Admitting: Internal Medicine

## 2011-12-25 ENCOUNTER — Encounter (HOSPITAL_COMMUNITY)
Admission: RE | Disposition: A | Discharge status: Home Health | Payer: Self-pay | Source: Ambulatory Visit | Attending: Internal Medicine

## 2011-12-25 ENCOUNTER — Encounter (HOSPITAL_COMMUNITY): Payer: Self-pay | Admitting: *Deleted

## 2011-12-25 ENCOUNTER — Telehealth: Payer: Self-pay | Admitting: *Deleted

## 2011-12-25 DIAGNOSIS — Z90710 Acquired absence of both cervix and uterus: Secondary | ICD-10-CM | POA: Insufficient documentation

## 2011-12-25 DIAGNOSIS — K626 Ulcer of anus and rectum: Secondary | ICD-10-CM

## 2011-12-25 DIAGNOSIS — K520 Gastroenteritis and colitis due to radiation: Secondary | ICD-10-CM

## 2011-12-25 DIAGNOSIS — Z8543 Personal history of malignant neoplasm of ovary: Secondary | ICD-10-CM | POA: Insufficient documentation

## 2011-12-25 DIAGNOSIS — D239 Other benign neoplasm of skin, unspecified: Secondary | ICD-10-CM | POA: Insufficient documentation

## 2011-12-25 DIAGNOSIS — Z9079 Acquired absence of other genital organ(s): Secondary | ICD-10-CM | POA: Insufficient documentation

## 2011-12-25 DIAGNOSIS — R32 Unspecified urinary incontinence: Secondary | ICD-10-CM | POA: Insufficient documentation

## 2011-12-25 DIAGNOSIS — Y842 Radiological procedure and radiotherapy as the cause of abnormal reaction of the patient, or of later complication, without mention of misadventure at the time of the procedure: Secondary | ICD-10-CM | POA: Insufficient documentation

## 2011-12-25 DIAGNOSIS — K5289 Other specified noninfective gastroenteritis and colitis: Secondary | ICD-10-CM | POA: Insufficient documentation

## 2011-12-25 DIAGNOSIS — Z5309 Procedure and treatment not carried out because of other contraindication: Secondary | ICD-10-CM | POA: Insufficient documentation

## 2011-12-25 DIAGNOSIS — R197 Diarrhea, unspecified: Secondary | ICD-10-CM | POA: Insufficient documentation

## 2011-12-25 HISTORY — DX: Nausea with vomiting, unspecified: Z98.890

## 2011-12-25 HISTORY — DX: Other specified postprocedural states: R11.2

## 2011-12-25 HISTORY — PX: COLONOSCOPY: SHX5424

## 2011-12-25 SURGERY — COLONOSCOPY
Anesthesia: Moderate Sedation

## 2011-12-25 MED ORDER — MIDAZOLAM HCL 5 MG/5ML IJ SOLN
INTRAMUSCULAR | Status: DC | PRN
  Administered 2011-12-25: 1 mg via INTRAVENOUS
  Administered 2011-12-25: 2.5 mg via INTRAVENOUS

## 2011-12-25 MED ORDER — SODIUM CHLORIDE 0.9 % IV SOLN: INTRAVENOUS | Status: DC

## 2011-12-25 MED ORDER — FENTANYL CITRATE 0.05 MG/ML IJ SOLN
INTRAMUSCULAR | Status: AC
  Filled 2011-12-25: qty 4

## 2011-12-25 MED ORDER — MIDAZOLAM HCL 10 MG/2ML IJ SOLN
INTRAMUSCULAR | Status: AC
  Filled 2011-12-25: qty 4

## 2011-12-25 MED ORDER — FENTANYL NICU IV SYRINGE 50 MCG/ML
INJECTION | INTRAMUSCULAR | Status: DC | PRN
  Administered 2011-12-25: 25 ug via INTRAVENOUS
  Administered 2011-12-25: 10 ug via INTRAVENOUS

## 2011-12-25 NOTE — Telephone Encounter (Signed)
Per Dr Olevia Perches, she would like patient to have a Barium Enema with air due to incomplete colonoscopy, radiation colitis and diarrhea. I have scheduled her for a BE on 01/10/12 @ 9:30 am (we are required to wait 2 weeks post polypectomy for a BE). She will need to go by the hospital before this date to pick up prep. Dr Olevia Perches would also like patient to be set up with Dr Zella Richer for consult: ileostomy. I have scheduled patient for an appointment with Dr Zella Richer for 01/16/12 @ 4:00 pm with a 3:30 pm arrival. I will contact patient regarding this information tomorrow since she is sedated today.

## 2011-12-25 NOTE — Discharge Instructions (Addendum)
We were unable to examine the entire colon due to the colon  Being swollen and inflamed, We will  Instead schedule a Barium enema  In approximately 2 weeks ( after the biopsy sites have healed) We will be maling an appointment for You to see a surgeon to discuss furhter options in treating Your colitis and diarrhea  Endoscopy Care After Please read the instructions outlined below and refer to this sheet in the next few weeks. These discharge instructions provide you with general information on caring for yourself after you leave the hospital. Your doctor may also give you specific instructions. While your treatment has been planned according to the most current medical practices available, unavoidable complications occasionally occur. If you have any problems or questions after discharge, please call your doctor. HOME CARE INSTRUCTIONS Activity  You may resume your regular activity but move at a slower pace for the next 24 hours.   Take frequent rest periods for the next 24 hours.   Walking will help expel (get rid of) the air and reduce the bloated feeling in your abdomen.   No driving for 24 hours (because of the anesthesia (medicine) used during the test).   You may shower.   Do not sign any important legal documents or operate any machinery for 24 hours (because of the anesthesia used during the test).  Nutrition  Drink plenty of fluids.   You may resume your normal diet.   Begin with a light meal and progress to your normal diet.   Avoid alcoholic beverages for 24 hours or as instructed by your caregiver.  Medications You may resume your normal medications unless your caregiver tells you otherwise. What you can expect today  You may experience abdominal discomfort such as a feeling of fullness or "gas" pains.   You may experience a sore throat for 2 to 3 days. This is normal. Gargling with salt water may help this.  Follow-up Your doctor will discuss the results of your  test with you. SEEK IMMEDIATE MEDICAL CARE IF:  You have excessive nausea (feeling sick to your stomach) and/or vomiting.   You have severe abdominal pain and distention (swelling).   You have trouble swallowing.   You have a temperature over 100 F (37.8 C).   You have rectal bleeding or vomiting of blood.  Document Released: 05/02/2004 Document Revised: 09/07/2011 Document Reviewed: 11/13/2007 [facility] Patient Information 2012 [place].

## 2011-12-25 NOTE — Interval H&P Note (Signed)
History and Physical Interval Note:  12/25/2011 9:04 AM  Desiree Adams  has presented today for surgery, with the diagnosis of rectal ulceration diarrhea  The various methods of treatment have been discussed with the patient and family. After consideration of risks, benefits and other options for treatment, the patient has consented to  Procedure(s) (LRB): COLONOSCOPY (N/A) as a surgical intervention .  The patients' history has been reviewed, patient examined, no change in status, stable for surgery.  I have reviewed the patients' chart and labs.  Questions were answered to the patient's satisfaction.     [name]

## 2011-12-25 NOTE — Op Note (Signed)
[facility] [place], [place]  [id number]  COLONOSCOPY PROCEDURE REPORT  PATIENT:  Desiree Adams, Desiree Adams  MR#:  [id number] BIRTHDATE:  November 25, 1945, 65 yrs. old  GENDER:  female ENDOSCOPIST:  Lowella Bandy. Olevia Perches, MD REF. 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 Digital rectal exam was performed and revealed no rectal masses. The Pentax Ped Colon [id number] endoscope was introduced through the anus and advanced to the sigmoid colon.  The quality of the prep was good..  The instrument was then slowly withdrawn as the colon was fully examined. <<PROCEDUREIMAGES>>  FINDINGS:  Ulcers in the rectum. multiple ulcerations extending into anal canal, covered with dark gray exudate, very painful, With standard forceps, biopsy was obtained and sent to pathology (see image3, image4, image5, image7, image6, and image8).  Colitis was found in the sigmoid colon. edematous nodular mucosa, bleeds easily unable to advance beyond 20 cm, lumen closed due to edematous tissue With standard forceps, biopsy was obtained and sent to pathology (see image1 and image2).   Retroflexed views in the rectum revealed it was not tolerated by the patient.    The scope was then withdrawn from the patient and the procedure completed.  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 stricture vs edematous mucosa closing the lumen , chronic diarrhea induced rectal ulcers RECOMMENDATIONS: 1) Await biopsy results resume all antidiarrheal medications Benema in 2 weeks after biopsy sites healed will likely need diverting colostomy  REPEAT EXAM:  In 5 year(s) for.  ______________________________ Lowella Bandy. Olevia Perches, MD  CC:  n. eSIGNED:   Lowella Bandy. Jontavious Commons at 12/25/2011 09:38 AM  Allie Bossier, [id number]

## 2011-12-25 NOTE — H&P (View-Only) (Signed)
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 She underwent a TAH and BSO. She was hospitalized for small bowel obstruction in 2001 and had laparoscopic cholecystectomy in 1998. She has a history of B12 deficiency. Her last flexible sigmoidoscopy in 2002 showed nonspecific changes in the rectosigmoid. Her last full colonoscopy in 2002 showed a hyperplastic polyp. She has been having 10-20 bowel movements a day and at night. She had no insurance last year  so  she did not want to see me. Her last appointment was February 2010. She has noticed dark discoloration of the skin at the rectal opening. It burns at night. She has some incontinence and perianal irritation and maceration of the skin due to frequent stools. We have discussed an ileostomy in the past but she was reluctant to consider that option.   Past Medical History  Diagnosis Date  . Ovarian cancer   . Small bowel obstruction   . 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 History of Clostridium difficile infection   . Vitamin B12 deficiency   . Biliary colic    Past Surgical History  Procedure Date  . Total abdominal hysterectomy w/ bilateral salpingoophorectomy   . Appendectomy   . Laparoscopic cholecystectomy   . Breast enhancement surgery   . Exploratory laparotomy w/ bowel resection     reports that she has never smoked. She has never used smokeless tobacco. She reports that she does not drink alcohol or use illicit drugs. family history includes Kidney cancer in an unspecified family member.  There is no history of Colon cancer. Allergies  Allergen Reactions  . Sulfamethoxazole     REACTION: unspecified        Review of Systems: Denies nausea or vomiting  any signs of obstruction. Denies rectal bleeding  The remainder of the 10 point ROS is negative except as outlined in H&P   Physical Exam: General appearance  Well developed, in no distress. Eyes- non icteric. HEENT nontraumatic, normocephalic. Mouth no lesions, tongue papillated, no cheilosis. Neck supple without adenopathy, thyroid not enlarged, no carotid bruits, no JVD. Lungs Clear to auscultation bilaterally. Cor normal S1, normal S2, regular rhythm, no murmur,  quiet precordium. Abdomen: Scaphoid soft abdomen with normal active bowel sounds. Mild diffuse tenderness. No distention. No bruit or mass. Rectal: Severely macerated perianal area in the perineum at least 8 cm in diameter. There is necrotic ulcer at the anal orifice which measured 2 cm in diameter and shows nodularity and grayish discoloration due to tissue necrosis.. It extended into the anal canal. Digital exam itself is very tender and I did not attempt anoscopic exam because it was very painful. Her liquid stool was Hemoccult-positive. There were no hemorrhoids. Extremities no pedal edema. Skin no lesions. Neurological alert and oriented x 3. Psychological normal mood and affect.  Assessment and Plan  Problem #1 Rectal ulcer consistent of necrotic tissue likely secondary to severe diarrhea and maceration of the rectal mucosa. I don't see any active purulent drainage but there is some odor which may indicate anaerobic infection. She needs an ileostomy to divert the stool flow.. She also needs a colonoscopy to assess the extent of the ulceration area. She is willing now  to consider ileostomy. She has radiation enteritis which may  preclude reversal of the ileostomy. I am told that an ileostomy will be surgically possible considering radiation induced fibrosis of the bowl. For now, she will start Flagyl 250 mg 3 times a day and continue probiotic. We will also use Analpram cream 2.5% 3 times a day. We will try to arrange for  Paregoric if it is available. It  reduced the diarrhea in the past. She will have followup labs today including iron levels and B12 blood counts as well as metabolic panel. She will be scheduled for a colonoscopy with limited prep.  12/13/2011 [name]

## 2011-12-26 ENCOUNTER — Encounter (HOSPITAL_COMMUNITY): Payer: Self-pay | Admitting: Internal Medicine

## 2011-12-26 NOTE — Telephone Encounter (Signed)
I have spoken to patient and explained that Dr Olevia Perches would like her to have a barium enema due to incomplete colonoscopy and severe edema in the colon. She states that the 1/2 Moviprep "did a number" on her and she feels unable to go thru with anything else that will cause her to have problems like she did yesterday. I explained that there will be a prep for the Barium Enema, however, Radiology will be able to explain the prep better as they typically give her instructions and perform the test. I have given her the number to contact radiology with specific questions. I have also explained that due to the severe swelling in her colon as well as her symptoms, Dr Olevia Perches would like her to see Dr Zella Richer to discuss possible ileostomy. I have given her the time and date of that appointment and have explained that she will most likely need the barium enema completed prior to her appointment with Dr Zella Richer in order for him to have a complete picture of her case and in order for him to proceed with any needed surgical procedure. Patient states that she just got a new position at work and cannot continue taking days off, so she will have to look. I have explained that this is, of course, her choice. However, Dr Olevia Perches does highly recommend these things. Patient verbalizes understanding of the above.

## 2011-12-26 NOTE — Telephone Encounter (Signed)
Left message on home voicemail and mobile voicemail to call back.

## 2011-12-27 ENCOUNTER — Encounter: Payer: Self-pay | Admitting: Internal Medicine

## 2011-12-28 ENCOUNTER — Telehealth: Payer: Self-pay | Admitting: Internal Medicine

## 2011-12-28 NOTE — Telephone Encounter (Signed)
Left voicemail for patient with phone number of [facility] and [facility] radiology for her to call and reschedule appointments. She may call back with any questions or concerns.

## 2012-01-01 ENCOUNTER — Telehealth: Payer: Self-pay | Admitting: Internal Medicine

## 2012-01-01 NOTE — Telephone Encounter (Signed)
I agree, she may not be able to do it due to her severe diarrhea and radiation colitis. Please Miralax 17gm ( 1 capful) twice about 4 hours apart and stay on light food the day before.

## 2012-01-01 NOTE — Telephone Encounter (Signed)
Patient does not think she can do the prep for the barium enema. States it messes up her system. Please, advise

## 2012-01-01 NOTE — Telephone Encounter (Signed)
Spoke with patient and gave her results as per letter dictated by Dr. Olevia Perches.

## 2012-01-01 NOTE — Telephone Encounter (Signed)
Patient calling because she does not think she can do the prep for barium enema.(Desiree Adams thinks Dr. Olevia Perches has answered this question earlier but it is not in the system) Please, advise

## 2012-01-02 NOTE — Telephone Encounter (Signed)
Patient given Dr. Olevia Perches recommendations.

## 2012-01-02 NOTE — Telephone Encounter (Signed)
Left a message for patient to call me. 

## 2012-01-02 NOTE — Telephone Encounter (Signed)
Left a message for patient to call me again. 

## 2012-01-10 ENCOUNTER — Other Ambulatory Visit (HOSPITAL_COMMUNITY): Payer: Medicare Other

## 2012-01-16 ENCOUNTER — Ambulatory Visit: Payer: Self-pay | Admitting: Internal Medicine

## 2012-01-16 ENCOUNTER — Ambulatory Visit (INDEPENDENT_AMBULATORY_CARE_PROVIDER_SITE_OTHER): Payer: Medicare Other | Admitting: General Surgery

## 2012-02-01 ENCOUNTER — Telehealth (INDEPENDENT_AMBULATORY_CARE_PROVIDER_SITE_OTHER): Payer: Self-pay

## 2012-02-01 NOTE — Telephone Encounter (Signed)
LMOV pt's appt rescheduled from 02/20/12 to 02/09/12 at 4:00pm.

## 2012-02-05 ENCOUNTER — Telehealth: Payer: Self-pay | Admitting: Internal Medicine

## 2012-02-05 NOTE — Telephone Encounter (Signed)
Spoke with patient and she could not remember the instructions Dr Olevia Perches gave her for the procedure. Gave her the instructions as per note on 01/01/12.

## 2012-02-06 ENCOUNTER — Other Ambulatory Visit: Payer: Self-pay | Admitting: Internal Medicine

## 2012-02-06 ENCOUNTER — Ambulatory Visit (HOSPITAL_COMMUNITY)
Admission: RE | Admit: 2012-02-06 | Discharge: 2012-02-06 | Disposition: A | Discharge status: Home / Self Care | Payer: Medicare Other | Source: Ambulatory Visit | Attending: Internal Medicine | Admitting: Internal Medicine

## 2012-02-06 DIAGNOSIS — K520 Gastroenteritis and colitis due to radiation: Secondary | ICD-10-CM

## 2012-02-06 DIAGNOSIS — R197 Diarrhea, unspecified: Secondary | ICD-10-CM | POA: Insufficient documentation

## 2012-02-06 DIAGNOSIS — Z923 Personal history of irradiation: Secondary | ICD-10-CM | POA: Insufficient documentation

## 2012-02-06 DIAGNOSIS — Z9049 Acquired absence of other specified parts of digestive tract: Secondary | ICD-10-CM | POA: Insufficient documentation

## 2012-02-06 DIAGNOSIS — K56609 Unspecified intestinal obstruction, unspecified as to partial versus complete obstruction: Secondary | ICD-10-CM | POA: Insufficient documentation

## 2012-02-06 DIAGNOSIS — Z8543 Personal history of malignant neoplasm of ovary: Secondary | ICD-10-CM | POA: Insufficient documentation

## 2012-02-09 ENCOUNTER — Ambulatory Visit (INDEPENDENT_AMBULATORY_CARE_PROVIDER_SITE_OTHER): Payer: Medicare Other | Admitting: General Surgery

## 2012-02-09 ENCOUNTER — Encounter (INDEPENDENT_AMBULATORY_CARE_PROVIDER_SITE_OTHER): Payer: Self-pay | Admitting: General Surgery

## 2012-02-09 VITALS — BP 110/68 | HR 84 | Temp 98.5°F | Resp 18 | Ht 63.5 in | Wt 120.0 lb

## 2012-02-09 DIAGNOSIS — K520 Gastroenteritis and colitis due to radiation: Secondary | ICD-10-CM

## 2012-02-09 NOTE — Patient Instructions (Signed)
I will discuss your situation with Dr. Olevia Perches and then call you with a final recommendation.

## 2012-02-11 NOTE — Progress Notes (Signed)
Patient ID: Desiree Adams, female   DOB: 18-Apr-1946, 66 y.o.   MRN: [id number]  No chief complaint on file.   HPI Desiree Adams is a 66 y.o. female.   HPI  She is referred by Dr. [name] for consideration of an ileostomy.  She underwent a TAH/BSO in 1969 for ovarian cancer.  This was followed by XRT.  She developed an SBO requiring exploratory laparotomy and small bowel resections.  She has  developed radiation colitis and proctitis and has numerous BMs per day (as many as 20-30).  This has been going on for many years.  She has had a recent colonoscopy demonstrating ulcerations in her rectum which are benign by biopsy and a narrowing near her rectosigmoid junction which the colonoscope could not pass throught.  This narrowing was also seen on barium enema. Dr. Olevia Perches has discussed ileostomy with her in the past and she is here now for that reason.  Her activities are limited because of her bowel habits and the ulcerations cause discomfort as well.  Despite this, she continues to work.  Past Medical History  Diagnosis Date  . Ovarian cancer   . Small bowel obstruction   . 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 History of Clostridium difficile infection   . Vitamin B12 deficiency   . Biliary colic   . PONV (postoperative nausea and vomiting)     always nausea and  vomiting  . Difficult intubation     Past Surgical History  Procedure Date  . Total abdominal hysterectomy w/ bilateral salpingoophorectomy   . Appendectomy   . Laparoscopic cholecystectomy   . Breast enhancement surgery   . Exploratory laparotomy w/ bowel resection   . Colonoscopy 12/25/2011    Procedure: COLONOSCOPY;  Surgeon: Lafayette Dragon, MD;  Location: [facility];  Service: Endoscopy;  Laterality: N/A;    Family History  Problem Relation Age of Onset  . Kidney cancer      ???? Grandfather  . Colon cancer Neg Hx     Social  History History  Substance Use Topics  . Smoking status: Never Smoker   . Smokeless tobacco: Never Used  . Alcohol Use: Yes     very rarely    Allergies  Allergen Reactions  . Sulfamethoxazole     REACTION: unspecified    Current Outpatient Prescriptions  Medication Sig Dispense Refill  . cyanocobalamin (,VITAMIN B-12,) 1000 MCG/ML injection Inject 1 ml or 1000 mcg IM once a week for 3 weeks and then once a month for 5 months  3 mL  5  . loperamide (IMODIUM) 2 MG capsule Take 2 mg by mouth 2 (two) times daily.      Marland Kitchen HYDROcodone-acetaminophen (NORCO) 5-325 MG per tablet Take 1/2 tablet by mouth at night  15 tablet  1    Review of Systems Review of Systems  Constitutional: Negative for fever and chills.  Respiratory: Negative.   Cardiovascular: Negative.   Gastrointestinal: Positive for abdominal pain, diarrhea, anal bleeding and rectal pain.  Genitourinary: Negative.   Neurological: Negative.   Hematological: Negative.     Blood pressure 110/68, pulse 84, temperature 98.5 F (36.9 C), temperature source Temporal, resp. rate 18, height 5' 3.5" (1.613 m), weight 120 lb (54.432 kg).  Physical Exam Physical Exam  Constitutional:       Thin female in NAD.  HENT:  Head: Normocephalic and atraumatic.  Cardiovascular: Normal rate and regular  rhythm.   Pulmonary/Chest: Effort normal and breath sounds normal.  Abdominal: Soft. She exhibits no distension and no mass. There is no tenderness.       Small upper abdominal scars.  Midline scar.  Lower transverse scar.  No incisional hernias.  Genitourinary:       No hernias.  Rectal exam not done due to discomfort.  Musculoskeletal: She exhibits no edema.    Data Reviewed Dr. 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  She very likely has some radiation enteritis as well.  She states that her diarrhea got  significantly worse after the operation in 2001.  Thus, it is difficult for me to discern if her diarrhea is from radiation enteritis or colitis.      Plan    If her diarrhea is felt to be secondary to radiation colitis and proctitis than I feel a colostomy above the radiated field or an ileostomy would be reasonable options.  If the diarrhea may be secondary to radiation effects on her small bowel (enteritis), then both of the options could lead to trouble with dehydration long term.  I did discuss the procedure, risks, and aftercare of a colostomy or ileostomy with her.  The risks include but are not limited to bleeding, infection, wound problems, ostomy problems, hydration problems, anesthesia, injury to surrounding organs.  Before proceeding with any operative plans, I will discuss the situation further with Dr. Olevia Perches.       Makhya Arave J 02/11/2012, 9:37 PM

## 2012-02-14 ENCOUNTER — Telehealth: Payer: Self-pay | Admitting: Internal Medicine

## 2012-02-14 NOTE — Telephone Encounter (Signed)
Left a message for patient to call me. 

## 2012-02-15 NOTE — Telephone Encounter (Signed)
Left message a message for patient to call me.

## 2012-02-19 NOTE — Telephone Encounter (Signed)
Left a message for patient to call me. 

## 2012-02-20 ENCOUNTER — Ambulatory Visit (INDEPENDENT_AMBULATORY_CARE_PROVIDER_SITE_OTHER): Payer: Medicare Other | Admitting: General Surgery

## 2012-02-20 NOTE — Telephone Encounter (Signed)
Unable to reach patient.

## 2012-02-23 ENCOUNTER — Ambulatory Visit (INDEPENDENT_AMBULATORY_CARE_PROVIDER_SITE_OTHER): Payer: MEDICARE | Admitting: Internal Medicine

## 2012-02-23 ENCOUNTER — Encounter: Payer: Self-pay | Admitting: Internal Medicine

## 2012-02-23 VITALS — BP 102/64 | HR 68 | Ht 63.5 in | Wt 121.2 lb

## 2012-02-23 DIAGNOSIS — K520 Gastroenteritis and colitis due to radiation: Secondary | ICD-10-CM

## 2012-02-23 DIAGNOSIS — K56609 Unspecified intestinal obstruction, unspecified as to partial versus complete obstruction: Secondary | ICD-10-CM

## 2012-02-23 MED ORDER — PAREGORIC 2 MG/5ML PO TINC: 5.0000 mL | Freq: Two times a day (BID) | ORAL | Status: AC

## 2012-02-23 MED ORDER — METRONIDAZOLE 250 MG PO TABS: 250.0000 mg | ORAL_TABLET | Freq: Two times a day (BID) | ORAL | Status: AC

## 2012-02-23 NOTE — Progress Notes (Signed)
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 She has had chronic diarrhea for the past 40 years but her symptoms have become much worse recently and her rectum has become macerated due to diarrhea. She has had in the past, multiple episodes of small bowel obstruction. One of them, in 2001, necessitated  right hemicolectomy and resection of the distal small bowel at [facility] due to adhesions within the pelvis. She subsequently underwent a laparoscopic cholecystectomy in 2008. She has never had a full colonoscopy for several reasons; one is because of sigmoid colon stricture and because she did not have insurance and her workup was limited at that time. A flexible sigmoidoscopy in the last several weeks confirmed the presence of sigmoid stricture and radiation changes in her left colon. A barium enema confirmed the presence of the strictured along the inflamed left colon. Colon cancer could not be ruled out. She has seen Dr Zella Richer for consideration of resection of the sigmoid stricture and possible diverting colostomy or diverting ileostomy depending on the viability of her remaining colon. She totally agrees with the need to proceed with surgical resection.   Past Medical History  Diagnosis Date  . Ovarian cancer   . Small bowel obstruction   . 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 History of Clostridium difficile infection   . Vitamin B12 deficiency   . Biliary colic   . PONV (postoperative nausea and vomiting)     always nausea and  vomiting  . Difficult intubation    Past Surgical History  Procedure Date  . Total abdominal hysterectomy w/ bilateral salpingoophorectomy   . Appendectomy   . Laparoscopic  cholecystectomy   . Breast enhancement surgery   . Exploratory laparotomy w/ bowel resection   . Colonoscopy 12/25/2011    Procedure: COLONOSCOPY;  Surgeon: Lafayette Dragon, MD;  Location: [facility];  Service: Endoscopy;  Laterality: N/A;    reports that she has never smoked. She has never used smokeless tobacco. She reports that she drinks alcohol. She reports that she does not use illicit drugs. family history includes Kidney cancer in an unspecified family member.  There is no history of Colon cancer. Allergies  Allergen Reactions  . Sulfamethoxazole     REACTION: unspecified        Review of Systems:  The remainder of the 10 point ROS is negative except as outlined in H&P   Physical Exam: General appearance  Well developed, in no distress. Appears thin   Assessment and Plan:  Problem #1 Today, we discussed at length options for the future.I have reviewed all her records  From the paper chart dating back to [place] in [place], from [place], and from [place].Marland Kitchen She will be seen by Dr.Rosenbower again and will likely proceed with surgery. We will schedule a virtual colonoscopy  to visualize the colon proximal to the sigmoid stricture. Dr Zella Richer would like to preserve as much of her colon as possible .She will also be started on paregoric 1 teaspoon every 12 hours and Flagyl 250 mg twice a day which seemed to improve her diarrhea last time.    02/23/2012 [name]

## 2012-02-23 NOTE — Patient Instructions (Signed)
We have given you a prescription for paregoric to take to [place] ([place]). We have sent the following medications to your pharmacy for you to pick up at your convenience: Flagyl 250 mg twice daily We will schedule your virtual colonoscopy with [facility] and someone will call you back with that appointment. CC: Dr Jackolyn Confer

## 2012-03-05 ENCOUNTER — Telehealth: Payer: Self-pay | Admitting: *Deleted

## 2012-03-05 NOTE — Telephone Encounter (Signed)
I have explained to patient that Dr Gwenlyn Found, the radiologist feels that her CT colonoscopy would be better vizualized using a full prep rather than partial prep. I have advised that Dr Olevia Perches would like patient to do whatever would optimize her exam, therefore we would like her to complete the full prep suggested to her by [facility]. Patient states that she will not do a full colonoscopy prep as it "tore her up for 2 weeks" with her last colonoscopy prep. She states that she just cannot do a full prep. I will forward note to Dr Olevia Perches for her recommendation.

## 2012-03-05 NOTE — Telephone Encounter (Signed)
I have left a message on pt's cell phone to takr as much prep as it takes to get cleaned.

## 2012-03-05 NOTE — Telephone Encounter (Signed)
Dr Rose Phi called regarding patient's CT colonoscopy that is set to be scheduled in the near future. Dr Gwenlyn Found read over Dr Nichola Sizer recent office note that stated patient's mucosa was macerated, therefore, he was concerned about the possibility of perforating patient. Patient also advised [facility] that Dr Olevia Perches told her she likely only needed to use a miralax prep due to her severe diarrhea which she already experiences daily. However, Dr Gwenlyn Found feels that the exam would be better optimized if patient completes full prep. I have spoken to Dr Olevia Perches and have explained Dr Kennon Holter concerns. She states that she does not think patient's colon is likely to perforate due to years of disease causing a thickened colon. She states that she is perfectly fine with Dr Gwenlyn Found doing whatever he feels best for patient's testing. She states that if he needs a softer scope, that will be fine and if patient needs to complete the full prep, that is acceptable as well. I have attempted to reach patient to advise her of this but was unable to reach her. I have left a voicemail for her to call back at which time I will discuss the above with her.

## 2012-03-08 ENCOUNTER — Other Ambulatory Visit: Payer: MEDICARE

## 2012-03-18 ENCOUNTER — Encounter (INDEPENDENT_AMBULATORY_CARE_PROVIDER_SITE_OTHER): Payer: Self-pay | Admitting: General Surgery

## 2012-03-18 ENCOUNTER — Ambulatory Visit
Admission: RE | Admit: 2012-03-18 | Discharge: 2012-03-18 | Disposition: A | Discharge status: Home / Self Care | Payer: MEDICARE | Source: Ambulatory Visit | Attending: Internal Medicine | Admitting: Internal Medicine

## 2012-03-18 ENCOUNTER — Telehealth: Payer: Self-pay | Admitting: *Deleted

## 2012-03-18 DIAGNOSIS — K56609 Unspecified intestinal obstruction, unspecified as to partial versus complete obstruction: Secondary | ICD-10-CM

## 2012-03-18 DIAGNOSIS — K520 Gastroenteritis and colitis due to radiation: Secondary | ICD-10-CM

## 2012-03-18 NOTE — Progress Notes (Signed)
Patient ID: Desiree Adams, female   DOB: 01-16-1946, 66 y.o.   MRN: [id number] Records from [facility] received and reviewed.  Virtual colonoscopy has been scheduled by Dr. Olevia Perches.  Will arrange for an office appointment to discuss surgical options after virtual colonoscopy.

## 2012-03-18 NOTE — Telephone Encounter (Signed)
Received a call from Dr. Camila Li re: virtual CT. He states patient has a right breast mass recommends she have a mammogram and ultrasound to evaluate. Also has right lung mass, small nodules that could be mets. He states Dr. Gwenlyn Found will be reading CT also.

## 2012-03-19 ENCOUNTER — Telehealth: Payer: Self-pay | Admitting: *Deleted

## 2012-03-19 ENCOUNTER — Other Ambulatory Visit: Payer: Self-pay | Admitting: Internal Medicine

## 2012-03-19 DIAGNOSIS — N63 Unspecified lump in unspecified breast: Secondary | ICD-10-CM

## 2012-03-19 NOTE — Telephone Encounter (Signed)
Left a message for patient to call me at her home and cell number.

## 2012-03-19 NOTE — Telephone Encounter (Signed)
Please call pt , needs a mammogram, ?? mass on CT scan right breast. When was her last mammogram. Alao ?? Densities in the lung, needs CT scan of the chest before considering  any surgery on her colon. I left a message on both of her phones to call us.

## 2012-03-19 NOTE — Telephone Encounter (Signed)
Per Dr Nichola Sizer recommendations, patient needs to have a mammography for further evaluation of right breast mass seen on her CT colonoscopy. Patient has been scheduled for bilateral mammography and right breast ultrasound for further evaluation of the breast mass. Appointment is at the [facility] on Tuesday, 03/26/12 @ 2:20 pm.

## 2012-03-20 NOTE — Telephone Encounter (Signed)
Spoke with patient and gave her the appointment date and time for mammogram and ultrasound.

## 2012-03-21 ENCOUNTER — Telehealth: Payer: Self-pay | Admitting: Internal Medicine

## 2012-03-22 ENCOUNTER — Ambulatory Visit (INDEPENDENT_AMBULATORY_CARE_PROVIDER_SITE_OTHER): Payer: Medicare Other | Admitting: General Surgery

## 2012-03-22 VITALS — BP 118/70 | HR 64 | Temp 97.4°F | Resp 18 | Ht 64.0 in | Wt 121.1 lb

## 2012-03-22 DIAGNOSIS — R222 Localized swelling, mass and lump, trunk: Secondary | ICD-10-CM

## 2012-03-22 DIAGNOSIS — K520 Gastroenteritis and colitis due to radiation: Secondary | ICD-10-CM

## 2012-03-22 DIAGNOSIS — R918 Other nonspecific abnormal finding of lung field: Secondary | ICD-10-CM

## 2012-03-22 NOTE — Telephone Encounter (Signed)
Spoke with patient and told her it was an old message I had left her when I was trying to reach her about the mammogram appointment.

## 2012-03-22 NOTE — Progress Notes (Signed)
Patient ID: Azaria Kasai, female   DOB: 06/03/1946, 66 y.o.   MRN: [id number]  Chief Complaint  Patient presents with  . Pre-op Exam    discuss surgery    HPI Roslin Shia is a 66 y.o. female.   HPI  I have obtained the records from [facility]. She has undergone her virtual colonoscopy.  She is here for a preoperative visit. Her symptoms are unchanged. 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 In this area, there is a suspicion of several polyps at approximately 28-30 cm from the anal verge. The colon proximal to the area of radiation change was noted to have no abnormality. There was noted to be a 3.2 cm x 3.3 cm mass at the right lung base. There was also noted to be a right medial breast soft tissue mass adjacent to an implant. The lung mass has been present since 2001 and was 2.1 cm at that time. Evaluation at that time did not demonstrate any evidence of malignancy per the patient's report. The operation she had back at [facility] in  November of 2001 was exploratory laparotomy, extensive lysis of adhesions, terminal ileum descending colon resection with terminal ileum to transverse colon anastomosis, omental revascularization the pelvis with an omental pedicle flap.  She is scheduled to have a mammogram to evaluate the right breast mass. A chest CT was recommended to evaluate the right lower lung mass but that has not been ordered yet.  Past Medical History  Diagnosis Date  . Ovarian cancer   . Small bowel obstruction   . 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 History of Clostridium difficile infection   . Vitamin B12 deficiency   . Biliary colic   . PONV (postoperative nausea and vomiting)     always nausea and  vomiting  . Difficult intubation     Past Surgical History  Procedure Date  . Total abdominal hysterectomy w/ bilateral  salpingoophorectomy   . Appendectomy   . Laparoscopic cholecystectomy   . Breast enhancement surgery   . Exploratory laparotomy w/ bowel resection   . Colonoscopy 12/25/2011    Procedure: COLONOSCOPY;  Surgeon: Lafayette Dragon, MD;  Location: [facility];  Service: Endoscopy;  Laterality: N/A;    Family History  Problem Relation Age of Onset  . Kidney cancer      ???? Grandfather  . Colon cancer Neg Hx     Social History History  Substance Use Topics  . Smoking status: Never Smoker   . Smokeless tobacco: Never Used  . Alcohol Use: Yes     very rarely    Allergies  Allergen Reactions  . Sulfamethoxazole     REACTION: unspecified    Current Outpatient Prescriptions  Medication Sig Dispense Refill  . cyanocobalamin (,VITAMIN B-12,) 1000 MCG/ML injection Inject 1 ml or 1000 mcg IM once a week for 3 weeks and then once a month for 5 months  3 mL  5  . loperamide (IMODIUM) 2 MG capsule Take 2 mg by mouth 2 (two) times daily.      . Multiple Vitamin (MULTIVITAMIN) tablet Take 1 tablet by mouth daily.        Review of Systems Review of Systems  Gastrointestinal: Positive for diarrhea, blood in stool, anal bleeding and rectal pain.    Blood pressure 118/70, pulse 64, temperature 97.4 F (36.3 C), resp. rate 18, height  5\' 4"  (1.626 m), weight 121 lb 2 oz (54.942 kg).  Physical Exam Physical Exam  Constitutional:       Thin female in NAD.  HENT:  Head: Normocephalic and atraumatic.  Abdominal: Soft. She exhibits no distension and no mass. There is no tenderness.       Lower transverse scar.  Midline scar. Small upper abdominal scars.    Data Reviewed [facility] from [facility].  Virtual colonoscopy report.  Assessment    Severe radiation colitis and proctitis. There is a questionable polyp or polyps in that area. She was also found to have a right breast mass on CT scan. She has a right lower lobe lung lesion that is larger than it was 12 years ago.    Plan    Order CT  of chest. Await results of mammogram.  Plan laparoscopic possible open ileostomy or colostomy with intestinal resection. I'm not sure it will be safe or even possible to resect the narrowed area of the sigmoid colon from the standpoint of adhesions as well as potential for stump leak. She understands this. She understands that this indeed is a polyp and we cannot remove this area then it could continue to get larger.  I have explained the procedure and risks of colon resection.  Risks include but are not limited to bleeding, infection, wound problems, anesthesia, colorectal stump leak, problems with ileostomy or colostomy, injury to intraabominal organs (such as intestine, spleen, kidney, bladder, ureter, etc.), ileus, irregular bowel habits.  She seems to understand and wants to proceed.         Hermione Havlicek J 03/22/2012, 4:01 PM

## 2012-03-22 NOTE — Patient Instructions (Addendum)
We will schedule your CT of the chest.  Do the bowel prep the day before the operation.  [facility]  ONE-DAY (1) PRE-OP HOME COLON PREP INSTRUCTIONS: ** MIRALAX / GATORADE PREP **  Fill the two prescriptions at a pharmacy of your choice.  You must follow the instructions below carefully.  If you have questions or problems, please call and speak to someone in the clinic department at our office:   [phone].  MIRALAX - GATORADE -- DULCOLAX TABS:   Fill the prescriptions for MIRALAX  (255 gm bottle)    In addition, purchase four (4) DULCOLAX TABLETS (no prescription required), and one 64 oz GATORADE.  (Do NOT purchase red Gatorade; any other flavor is acceptable).   INSTRUCTIONS: 1. Five days prior to your procedure do not eat nuts, popcorn, or fruit with seeds.  Stop all fiber supplements such as Metamucil, Citrucel, etc.  2. The day before your procedure: o  CLEAR LIQUIDS: clear bouillon, broth, jello (NOT RED), black coffee, tea, soda, etc o 10:00am:  add the bottle of MiraLax to the 64-oz bottle of Gatorade, and dissolve.  Begin drinking the Gatorade mixture until gone (8 oz every 15-30 minutes).  Continue clear liquids until midnight (or bedtime). o   3. The day of your procedure:   Do not eat or drink ANYTHING after midnight before your surgery.     If you take Heart or Blood Pressure medicine, ask the pre-op nurses about these during your preop appointment.   Further pre-operative instructions will be given to you from the hospital.   Expect to be contacted 5-7 days before your surgery.

## 2012-03-23 LAB — BUN: BUN: 18 mg/dL (ref 6–23)

## 2012-03-23 LAB — CREATININE, SERUM: Creat: 1.42 mg/dL — ABNORMAL HIGH (ref 0.50–1.10)

## 2012-03-25 ENCOUNTER — Telehealth (INDEPENDENT_AMBULATORY_CARE_PROVIDER_SITE_OTHER): Payer: Self-pay

## 2012-03-25 ENCOUNTER — Telehealth: Payer: Self-pay | Admitting: Internal Medicine

## 2012-03-25 NOTE — Telephone Encounter (Signed)
Please send Diflucan 100 mg, #3 , 1 po qd. ?? Calmoseptine samples apply to around the rectum prn

## 2012-03-25 NOTE — Telephone Encounter (Signed)
Patient calling to report that the area that is covered by her Depends and rubber pants is itching. She states she cannot tell if she has a rash or not since the skin is red from irritation of the diarrhea. She states she thinks it is similar to "jock itch."  She is using [facility]. Asking for something else. Please, advise.

## 2012-03-25 NOTE — Telephone Encounter (Signed)
Notified [facility] pre-op pt will need ostomy site marked prior to her surgery on 04/25/12.

## 2012-03-26 ENCOUNTER — Ambulatory Visit
Admission: RE | Admit: 2012-03-26 | Discharge: 2012-03-26 | Disposition: A | Discharge status: Home / Self Care | Payer: Medicare Other | Source: Ambulatory Visit | Attending: General Surgery | Admitting: General Surgery

## 2012-03-26 ENCOUNTER — Ambulatory Visit
Admission: RE | Admit: 2012-03-26 | Discharge: 2012-03-26 | Disposition: A | Discharge status: Home / Self Care | Payer: Medicare Other | Source: Ambulatory Visit | Attending: Internal Medicine | Admitting: Internal Medicine

## 2012-03-26 ENCOUNTER — Other Ambulatory Visit: Payer: Medicare Other

## 2012-03-26 DIAGNOSIS — N63 Unspecified lump in unspecified breast: Secondary | ICD-10-CM

## 2012-03-26 DIAGNOSIS — R918 Other nonspecific abnormal finding of lung field: Secondary | ICD-10-CM

## 2012-03-26 MED ORDER — FLUCONAZOLE 100 MG PO TABS: ORAL_TABLET | ORAL | Status: DC

## 2012-03-26 MED ORDER — IOHEXOL 300 MG/ML  SOLN
75.0000 mL | Freq: Once | INTRAMUSCULAR | Status: AC | PRN
  Administered 2012-03-26: 75 mL via INTRAVENOUS

## 2012-03-26 NOTE — Telephone Encounter (Signed)
Spoke with patient and gave her Dr. Nichola Sizer recommendations. Rx sent to pharmacy. Samples up front for pick up.

## 2012-03-27 ENCOUNTER — Encounter: Payer: Self-pay | Admitting: Pulmonary Disease

## 2012-03-27 ENCOUNTER — Encounter (INDEPENDENT_AMBULATORY_CARE_PROVIDER_SITE_OTHER): Payer: Self-pay | Admitting: General Surgery

## 2012-03-27 ENCOUNTER — Other Ambulatory Visit (INDEPENDENT_AMBULATORY_CARE_PROVIDER_SITE_OTHER): Payer: Self-pay | Admitting: General Surgery

## 2012-03-27 ENCOUNTER — Other Ambulatory Visit: Payer: Medicare Other

## 2012-03-27 ENCOUNTER — Ambulatory Visit (INDEPENDENT_AMBULATORY_CARE_PROVIDER_SITE_OTHER): Payer: Medicare Other | Admitting: Pulmonary Disease

## 2012-03-27 ENCOUNTER — Telehealth: Payer: Self-pay | Admitting: Internal Medicine

## 2012-03-27 VITALS — BP 90/64 | HR 81 | Temp 98.2°F | Ht 64.0 in | Wt 122.2 lb

## 2012-03-27 DIAGNOSIS — R918 Other nonspecific abnormal finding of lung field: Secondary | ICD-10-CM

## 2012-03-27 DIAGNOSIS — D3A090 Benign carcinoid tumor of the bronchus and lung: Secondary | ICD-10-CM | POA: Insufficient documentation

## 2012-03-27 DIAGNOSIS — R222 Localized swelling, mass and lump, trunk: Secondary | ICD-10-CM

## 2012-03-27 NOTE — Telephone Encounter (Signed)
Reviewed curbside films with Dr Olevia Perches  History  Smoking status  . Never Smoker   Smokeless tobacco  . Never Used     - CT June 2013 shows 3cm Rt side mass. In my opinion this mass is very smooth border and has varying densities inside making suspicion for cancer low and suspicion for benign lesion like a hamartoma high. The old Ct chest from 2001 is archived and no report  Available. If this is there back then, then diagnosis is 100% benign   PLan  - I will ask Dr Rosario Jacks to do some digging in our radiology archives  - dr Cristal Generous will review her [facility] records from 2003 or so to see if any mention of this nodule - Based on above, arrange pulmonary followu v other tests  Thanks  MR  Cc Dr Olevia Perches and Dr Zella Richer

## 2012-03-27 NOTE — Progress Notes (Signed)
Name: Desiree Adams MRN: [id number] DOB: August 27, 1946  Referring Physician:  Zella Richer Reason for Consultation:  Right lung mass   INTERVENTIONAL PULMONOLOGY CONSULTATION NOTE   History of Present Illness: 66 yo with history of ovarian cancer diagnosed at age 12 s/p total abdominal  Hysterectomy and bilateral salping oophorectomy + radiation therapy referred for further evaluation of right lung mass.  The mass was first discovered incidentally on chest imaging performed at [facility] on 08/15/2000.  The images are not available to be but the CT report comments on 1.9 x 2 cm smoothly marginated nodule in the right lower lobe without definite calcium or fat.  At that time no workup was reportedly pursued as the lesion was considered to have low malignant potential.  Recent chest imaging performed on 03/26/2012 demonstrated same lesion that increased in size to 3.1 x 3.3 cm. Today [name] denies any respiratory symptoms whatsoever.  Specifically there is no dyspnea, cough, chest pain or hemoptysis.   Past Medical History  Diagnosis Date  . Ovarian cancer   . Small bowel obstruction   . 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 History of Clostridium difficile infection   . Vitamin B12 deficiency   . Biliary colic   . PONV (postoperative nausea and vomiting)     always nausea and  vomiting  . Difficult intubation    Past Surgical History  Procedure Date  . Total abdominal hysterectomy w/ bilateral salpingoophorectomy   . Appendectomy   . Laparoscopic cholecystectomy   . Breast enhancement surgery   . Exploratory laparotomy w/ bowel resection   . Colonoscopy 12/25/2011    Procedure: COLONOSCOPY;  Surgeon: Lafayette Dragon, MD;  Location: [facility];  Service: Endoscopy;  Laterality: N/A;   Prior to Admission medications   Medication Sig Start Date End Date Taking? Authorizing Provider  cyanocobalamin (,VITAMIN  B-12,) 1000 MCG/ML injection Inject 1 ml or 1000 mcg IM once a week for 3 weeks and then once a month for 5 months 12/14/11  Yes Lafayette Dragon, MD  fluconazole (DIFLUCAN) 100 MG tablet Take on po daily 03/26/12  Yes Lafayette Dragon, MD  loperamide (IMODIUM) 2 MG capsule Take 2 mg by mouth 2 (two) times daily.   Yes Historical Provider, MD  Multiple Vitamin (MULTIVITAMIN) tablet Take 1 tablet by mouth daily.   Yes Historical Provider, MD   Allergies Allergies  Allergen Reactions  . Sulfamethoxazole     REACTION: unspecified   Family History Lung disease: None Malignancies:  Kidney cancer in grandfather  Social History Smoking:  Lifelong nonsmoker, but exposed to second hand smoke Occupational exposure:  Possible exposure to asbestos  Review Of Systems: Constitutional: Negative for fever, chills, weight loss, malaise/fatigue and diaphoresis.  HENT: Negative for hearing loss, ear pain, nosebleeds, congestion, sore throat, neck pain, tinnitus and ear discharge.   Eyes: Negative for blurred vision, double vision, photophobia, pain, discharge and redness.  Respiratory: Negative for cough, hemoptysis, sputum production, shortness of breath, wheezing and stridor.   Cardiovascular: Negative for chest pain, palpitations, orthopnea, claudication, leg swelling and PND.  Gastrointestinal: Positive for diarrhea, blood in stool, anal bleeding and rectal pain.  Genitourinary: Negative for dysuria, urgency, frequency, hematuria and flank pain.  Musculoskeletal: Negative for myalgias, back pain, joint pain and falls.  Skin: Negative for itching and rash.  Neurological: Negative for dizziness, tingling, tremors, sensory change, speech change, focal weakness, seizures, loss of consciousness, weakness and  headaches.  Endo/Heme/Allergies: Negative for environmental allergies and polydipsia. Does not bruise/bleed easily.   Vital Signs: Filed Vitals:   03/27/12 1330  BP: 90/64  Pulse: 81  Temp: 98.2 F  (36.8 C)  TempSrc: Oral  Height: 5\' 4"  (1.626 m)  Weight: 122 lb 3.2 oz (55.43 kg)  SpO2: 97%   Physical Examination: General:  No acute distress, thin female looking her age Neuro:  Alert, oriented, nonfocal HEENT:  Pink conjunctivae, moist mucus membranes Neck:  No lymphadenopathy Cardiovascular:  RRR, no murmurs Lungs:  Bilateral air entry, no wheezes, rhonchi, rales Abdomen:  Well healed multiple surgical scars Musculoskeletal:  No clubbing, cyanosis or edema Skin: No rash   Labs:  Lab 03/22/12 1545  HGB --  HCT --  WBC --  PLT --  NA --  K --  CL --  CO2 --  GLUCOSE --  BUN 18  CREATININE 1.42*  CALCIUM --  MG --  PHOS --  INR --  APTT --   Chest CT: 08/15/2000 (images are not available) >>> 1.9 x 2.0 cm smoothly marginated nodule without calcium or fat.  Additional 4 mm nodule just above the right hemidiaphragm.  Additional 3 mm subpleural nodule in the right upper lobe.  There is 5 mm pre carinal lymph node.  There is small left pleural effusion.  03/22/2012 (images reviewed) >>> Well-circumscribed mass within the right lower lobe measures 3.1 x 3.30 by 3.3 cm. Small nodule in the right lower lobe measures 4.7 mm, image 42. Within the right upper lobe there is a subpleural nodule measuring 3.5 mm, image 21.  PFT: NA  TTE: NA  ASSESSMENT AND PLAN  Distant history of ovarian carcinoma. Right lower lobe mass, present since 2001, somewhat increased in size.  The patient is low risk for primary bronchogenic carcinoma, and morphology and the time course speak against lung primary. Possibly benign neoplasm, less likely metastatic lesion. Small, stable right lower lobe and and right upper lobe nodules.  -->  PET/CT.  If lesion is not hypermetabolic, will consider conservative follow up.  If lesion is hypermetabolic would discuss ENB vs transthoracic CT-guided biopsy. -->  Follow up appoint when PET/CT results are available.  Desiree Adams, M.D.,  F.C.C.P. Interventional Pulmonology [employer] Cell: [phone] Pager: [phone]  03/27/2012, 2:38 PM

## 2012-03-27 NOTE — Patient Instructions (Signed)
Obtain records (imaging) PET scan Follow up appointment (discuss ENB vs. transthoracic biopsy)

## 2012-03-27 NOTE — Progress Notes (Signed)
Patient ID: Desiree Adams, female   DOB: March 10, 1946, 66 y.o.   MRN: [id number] The results of her chest CT demonstrates that the right lower lobe lesion is larger measuring over 3 cm now. She also has 2 subcentimeter right lung lesions. Mammogram demonstrates that the area seen on chest CT is a herniation of her implant through the capsule. I told her that the lung mass needed further evaluation and we would want to refer her to a pulmonologist for this. She is in agreement.

## 2012-03-28 ENCOUNTER — Other Ambulatory Visit: Payer: Medicare Other

## 2012-03-28 NOTE — Telephone Encounter (Signed)
Hi Desiree Adams (with cc to Dr Zella Richer),  Dr Rosario Jacks got back to me saying that a) she could not get into archived film but based on report from 2001, this mass has grown from 1.7cm in 2001 to 3cm now and so needs attention. She is concerned that there might be malignant transformation. I am happy to see patient and discuss. Please let me know if my office should proceed and make appt to see patient    Here is the report from June 01, 2000  A ROUND, WELL DEFINED NONCALCIFIED MASS IS SEEN WITHIN THE   RIGHT LOWER LOBE, WHICH MEASURES 1.7 X 1.8 CM IN   DIMENSION.  NO OTHER LUNG NODULES ARE SEEN.  NO PLEURAL   EFFUSION OR INFILTRATES ARE IDENTIFIED.  NO HILAR,   AXILLARY, OR MEDIASTINAL ADENOPATHY ARE IDENTIFIE  THanks  MR

## 2012-03-28 NOTE — Telephone Encounter (Signed)
I just spoke to Dr Earnest Conroy and he has seen patient. He is taking all the steps. I will close note. Thanks guys

## 2012-03-28 NOTE — Telephone Encounter (Signed)
Hi Murali, thanks, Mrs Madrazo saw Dr Oliver Pila in your office yesterday  per Dr Ilda Basset arrangements.He seems to be on the same wavelength. Would you mind letting him know that you have been involved, I am sure he read the Epic notes but it is not always easy to find everything.We will leave further decisions concerning the pulmonary nodule up to you guys. Thank you very much for your involvement.I am sending cc to [employer].

## 2012-04-03 ENCOUNTER — Telehealth: Payer: Self-pay | Admitting: Internal Medicine

## 2012-04-03 ENCOUNTER — Encounter (HOSPITAL_COMMUNITY): Payer: Self-pay

## 2012-04-03 ENCOUNTER — Encounter (HOSPITAL_COMMUNITY)
Admission: RE | Admit: 2012-04-03 | Discharge: 2012-04-03 | Disposition: A | Discharge status: Home / Self Care | Payer: Medicare Other | Source: Ambulatory Visit | Attending: Pulmonary Disease | Admitting: Pulmonary Disease

## 2012-04-03 ENCOUNTER — Telehealth: Payer: Self-pay | Admitting: Pulmonary Disease

## 2012-04-03 DIAGNOSIS — R911 Solitary pulmonary nodule: Secondary | ICD-10-CM | POA: Insufficient documentation

## 2012-04-03 DIAGNOSIS — R918 Other nonspecific abnormal finding of lung field: Secondary | ICD-10-CM

## 2012-04-03 DIAGNOSIS — R222 Localized swelling, mass and lump, trunk: Secondary | ICD-10-CM | POA: Insufficient documentation

## 2012-04-03 DIAGNOSIS — N63 Unspecified lump in unspecified breast: Secondary | ICD-10-CM | POA: Insufficient documentation

## 2012-04-03 MED ORDER — FLUDEOXYGLUCOSE F - 18 (FDG) INJECTION
18.6000 | Freq: Once | INTRAVENOUS | Status: AC | PRN
  Administered 2012-04-03: 18.6 via INTRAVENOUS

## 2012-04-03 NOTE — Telephone Encounter (Signed)
I have discussed the PETs scan report with the pt- Concern about possible neoplasm , SUV 6.6. She is waiting for Dr Earnest Conroy. To discuss the results with her.

## 2012-04-03 NOTE — Telephone Encounter (Signed)
PET scan done today.  Dr Earnest Conroy scheduled off.  Pt has appt with KZ on 7.10.13 @ 3:30pm.  Called spoke with patient, informed her that Parker reviews results at office visits and recommended that she keep her 7.10.13 ov.  Pt ok with this and verbalized her understanding.  Nothing further needed at this time.  Will sign off.

## 2012-04-05 ENCOUNTER — Other Ambulatory Visit: Payer: Self-pay | Admitting: Pulmonary Disease

## 2012-04-05 DIAGNOSIS — R918 Other nonspecific abnormal finding of lung field: Secondary | ICD-10-CM

## 2012-04-08 ENCOUNTER — Encounter (INDEPENDENT_AMBULATORY_CARE_PROVIDER_SITE_OTHER): Payer: Self-pay | Admitting: General Surgery

## 2012-04-08 ENCOUNTER — Telehealth: Payer: Self-pay | Admitting: Internal Medicine

## 2012-04-08 NOTE — Telephone Encounter (Signed)
Pt called today to report Dr Gwenlyn Found states she needs lung surgery before her Colostomy surgery. She wants to know why she can't have them both at the same time. Informed pt Dr Olevia Perches is off today and I will send her this note and she will either call her back or give me a note. Pt stated understanding and can be reached in the day at [phone] or after 5PM at [phone].

## 2012-04-08 NOTE — Progress Notes (Signed)
Patient ID: Desiree Adams, female   DOB: June 06, 1946, 66 y.o.   MRN: [id number] Her PET scan demonstrates a neoplastic lesion in the right lower lobe suspicious for potential malignancy. Her pulmonologist suggested that she cancel her intestinal surgery and have her lung surgery done first.  I discussed all this with her. She is wondering why the operations can't be done at the same time and I explained to her that she would have to see the thoracic surgeon and he and I would have to discuss this as well. I will speak with her thoracic surgeon once her appointment is made. I will not cancel her operation at this time until I've spoken with the thoracic surgeon.

## 2012-04-08 NOTE — Telephone Encounter (Signed)
Dr Zella Richer called me with PET scan result that is HOT. We did note that DR Z has made referral 7/5/1`3 to [facility]. Please ensure patient has appt ASAP for this per Dr Brantley Stage of [facility]  Thanks  MR

## 2012-04-08 NOTE — Telephone Encounter (Signed)
The decision is between the two surgeons, I don't make that decision. It would be too extensive surgery to have ileostomy and colon resection and at the same time lung resection. She needs to discuss it with Dr Zella Richer which of the two surgeries take a priority.

## 2012-04-08 NOTE — Telephone Encounter (Signed)
Referral faxed to [facility] and waiting on appt dr Zella Richer wants to know when this appt is set up [email or web address]  will call him Joellen Jersey

## 2012-04-08 NOTE — Telephone Encounter (Signed)
Well 1st of all i need a referral to [facility] and then i need to to know why she needs to see ro[facility]ower

## 2012-04-08 NOTE — Telephone Encounter (Signed)
Please advise Chester thanks

## 2012-04-08 NOTE — Telephone Encounter (Signed)
Per libby send back to her

## 2012-04-09 NOTE — Telephone Encounter (Signed)
Informed pt of Dr Nichola Sizer recommendations and then she stated that I don't understand. She states she has a new growth on her anus that is closing up the entrance to her rectum and soon it will be blocked. She reports the "growth" has tripled in size over the past 3 weeks. I informed her we don't have anything to do with either surgery and suggested she call both the surgeons and maybe they can discuss it. Again, she brought up the new growth. Pt has an appt with pulmonologist tomorrow. Please advise. Thanks.

## 2012-04-10 ENCOUNTER — Ambulatory Visit (INDEPENDENT_AMBULATORY_CARE_PROVIDER_SITE_OTHER): Payer: Medicare Other | Admitting: Pulmonary Disease

## 2012-04-10 ENCOUNTER — Encounter: Payer: Self-pay | Admitting: Pulmonary Disease

## 2012-04-10 VITALS — BP 118/72 | HR 77 | Ht 64.0 in | Wt 123.4 lb

## 2012-04-10 DIAGNOSIS — R911 Solitary pulmonary nodule: Secondary | ICD-10-CM

## 2012-04-11 NOTE — Progress Notes (Signed)
PFT visit only

## 2012-04-12 ENCOUNTER — Telehealth (INDEPENDENT_AMBULATORY_CARE_PROVIDER_SITE_OTHER): Payer: Self-pay | Admitting: General Surgery

## 2012-04-12 NOTE — Telephone Encounter (Signed)
Told her I spoke with Dr. Roxan Hockey about her situation. He will see her next week.

## 2012-04-16 ENCOUNTER — Other Ambulatory Visit: Payer: Self-pay | Admitting: *Deleted

## 2012-04-16 ENCOUNTER — Institutional Professional Consult (permissible substitution) (INDEPENDENT_AMBULATORY_CARE_PROVIDER_SITE_OTHER): Payer: Medicare Other | Admitting: Thoracic Surgery (Cardiothoracic Vascular Surgery)

## 2012-04-16 ENCOUNTER — Encounter: Payer: Self-pay | Admitting: Thoracic Surgery (Cardiothoracic Vascular Surgery)

## 2012-04-16 ENCOUNTER — Encounter (HOSPITAL_COMMUNITY): Payer: Self-pay | Admitting: Pharmacy Technician

## 2012-04-16 ENCOUNTER — Telehealth: Payer: Self-pay | Admitting: Internal Medicine

## 2012-04-16 VITALS — BP 106/70 | HR 88 | Resp 16 | Ht 64.0 in | Wt 121.0 lb

## 2012-04-16 DIAGNOSIS — R918 Other nonspecific abnormal finding of lung field: Secondary | ICD-10-CM

## 2012-04-16 DIAGNOSIS — R222 Localized swelling, mass and lump, trunk: Secondary | ICD-10-CM

## 2012-04-16 NOTE — Telephone Encounter (Signed)
Patient reports "thumb nail size growth on her anus".  She reports that it is not painful nor bleeding.  She says it is not a hemorrhoid, she refers to it as a "growth that is growing at an alarming rate".  She is concerned that it will prevent her from having a BM and close off her anus.  I have offered her an appt with Tye Savoy RNP tomorrow, but she declines and says she can only come next Wed.  We have only urgent appts for next Wed and Dr. Olevia Perches your office schedule is full for next Wed.  She asks that I call her back if there are any openings.  They have postponed her colon surgery to do the lung surgery.

## 2012-04-16 NOTE — Telephone Encounter (Signed)
I will see her next Wed since , surely, there will be cancellations, just add her on. Thanx

## 2012-04-16 NOTE — Progress Notes (Signed)
PCP is [name], MD Referring Provider is Rush Barer*  Chief Complaint  Patient presents with  . Lung Mass    eval and treat    HPI: 66 yo WF with a cc/o right lung mass.  A saber is a 66 year old woman with a history of ovarian cancer at age 42. This was treated with surgical resection and radiation. 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 Jackolyn Confer. Her preoperative evaluation a chest x-ray showed a right lung mass. On CT scan this was a 3.3 cm diameter smooth, round mass in the right lower lobe. A PET CT was done which showed this lesion to be hypermetabolic with an SUV of 6.6. Note she had been found to have a right lower lobe mass on CT back in 2001. She was evaluated for this at [facility] in no surgery or followup was recommended. The CT from 2001 is not available for review, but the report indicates that this was a 2 cm round mass.  Past Medical History  Diagnosis Date  . Ovarian cancer   . Small bowel obstruction   . 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 History of Clostridium difficile infection   . Vitamin B12 deficiency   . Biliary colic   . PONV (postoperative nausea and vomiting)     always nausea and  vomiting  . Difficult intubation   . Right lower lobe lung mass     Past Surgical History  Procedure Date  . Total abdominal hysterectomy w/ bilateral salpingoophorectomy   . Appendectomy   . Laparoscopic cholecystectomy   . Breast enhancement surgery   . Exploratory laparotomy w/ bowel resection   . Colonoscopy 12/25/2011    Procedure: COLONOSCOPY;  Surgeon: Lafayette Dragon, MD;  Location: [facility];  Service: Endoscopy;  Laterality: N/A;    Family History  Problem Relation Age of Onset  . Kidney cancer      ???? Grandfather  . Colon cancer Neg Hx     Social  History History  Substance Use Topics  . Smoking status: Never Smoker   . Smokeless tobacco: Never Used  . Alcohol Use: Yes     very rarely    Current Outpatient Prescriptions  Medication Sig Dispense Refill  . cyanocobalamin (,VITAMIN B-12,) 1000 MCG/ML injection Inject 1 ml or 1000 mcg IM once a week for 3 weeks and then once a month for 5 months  3 mL  5  . loperamide (IMODIUM) 2 MG capsule Take 2 mg by mouth 2 (two) times daily.      . Multiple Vitamin (MULTIVITAMIN) tablet Take 1 tablet by mouth daily.        Allergies  Allergen Reactions  . Sulfamethoxazole     REACTION: unspecified    Review of Systems  Constitutional: Positive for unexpected weight change (weight loss).  Respiratory: Negative.  Negative for cough, chest tightness, wheezing and stridor.   Cardiovascular: Negative.  Negative for chest pain and leg swelling.  Gastrointestinal:       Chronic diarrhea Radiation enteritis Needs ileostomy/ colostomy  Genitourinary:       TAH/ SBO for ovarian CA age 34 Radiation to pelvis  Neurological: Negative.   Hematological: Negative.   All other systems reviewed and are negative.    BP 106/70  Pulse 88  Resp 16  Ht 5'  4" (1.626 m)  Wt 121 lb (54.885 kg)  BMI 20.77 kg/m2  SpO2 98% Physical Exam  Constitutional: She is oriented to person, place, and time. She appears well-developed and well-nourished. No distress.  HENT:  Head: Normocephalic and atraumatic.  Eyes: EOM are normal. Pupils are equal, round, and reactive to light.  Neck: Neck supple. No thyromegaly present.  Cardiovascular: Normal rate, regular rhythm, normal heart sounds and intact distal pulses.  Exam reveals no gallop and no friction rub.   No murmur heard. Pulmonary/Chest: Effort normal and breath sounds normal. She has no wheezes. She has no rales.  Abdominal: Soft. There is no tenderness.  Musculoskeletal: Normal range of motion. She exhibits edema.  Lymphadenopathy:    She has no  cervical adenopathy.  Neurological: She is alert and oriented to person, place, and time. No cranial nerve deficit.  Skin: Skin is warm and dry.     Diagnostic Tests: CT chest 03/26/12  IMPRESSION:  1. Indeterminant mass within the right lower lobe. Cannot rule  out primary pulmonary neoplasm. Advise further evaluation with PET  CT.  2. Rounded soft tissue mass in the medial right breast adjacent to  the calcified breast implant. Correlate clinically and with  mammography.  PET/ CT 04/03/12  IMPRESSION:  1. Rounded hypermetabolic right lower lobe mass is consistent with  neoplasm.  2. No evidence of mediastinal metastasis or systemic metastasis.  3. Small right lower lobe pulmonary nodule is not hypermetabolic  but below the size limit for accurate PET characterization.  Recommend attention on follow-up.  4. Mild uptake associated with the inferior right breast mass is  felt likely to be benign.    Impression: 66 year old woman with a right lower lobe mass which is 3.3 cm in diameter and hypermetabolic with an SUV of 6.6. This lesion has been present since 2001, when it measured 2 cm. The lesion is very well circumscribed and rounded. And even though it is hypermetabolic on PET, I suspect that it is likely benign. 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 There is no clear time on a followup from 2001, so we cannot totally rule out that this lesion was initially benign and now has become more aggressive. However it is most likely that this is then very slowly growing since it was first discovered that time.  In any event, it does need to be removed surgically. The only question is the timing in regards to her planned ileostomy and colostomy. I had a long discussion with Ms. Eun and reviewed the scans with her. I feel strongly that the 2 operations should not be combined. I discussed with her the pros and cons of lung surgery followed by abdominal surgery and vice versa.  I do  think it would be best to do the lung surgery first. However, even though the lesion is hypermetabolic, it has been present for so long that I doubt she would be taking a major risk by proceeding with the abdominal surgery.   After discussion Ms. Shealy wishes to proceed with the pulmonary resection first, to be followed by the abdominal surgery after she has recovered. I discussed with her in detail the indications, risks, benefits, and alternative treatments. She understands that the risk of surgery include but are not limited to death, stroke, MI, DVT, PE, bleeding, possible need for transfusion, infection, prolonged air leak, as well as other unpredictable or a suitable, occasions. She is a good operative candidate and overall her risks with the  surgery are very low. We did discuss the expected hospital stay, limitations postoperatively, and expected return to full activities. She understands and accepts the risk of surgery and wishes to proceed with right vats, possible basilar segmentectomy or lower lobectomy.  Plan: Right VATS, possible segmentectomy or lobectomy on Thursday, July 25. She will be admitted on the day of surgery.

## 2012-04-17 ENCOUNTER — Encounter (INDEPENDENT_AMBULATORY_CARE_PROVIDER_SITE_OTHER): Payer: Self-pay | Admitting: General Surgery

## 2012-04-17 NOTE — Progress Notes (Signed)
Patient ID: Desiree Adams, female   DOB: 02-10-1946, 66 y.o.   MRN: [id number] Spoke with Dr. Roxan Hockey about her yesterday.  She has elected to have her thoracic surgery first and then have her abdominal surgery after she recovers from the thoracic surgery.

## 2012-04-17 NOTE — Telephone Encounter (Signed)
Patient is scheduled for 9:30 04/24/12.  Appointment is scheduled with the patient

## 2012-04-23 NOTE — Pre-Procedure Instructions (Signed)
Desiree Adams  04/23/2012   Your procedure is scheduled on:  Thursday April 25, 2012.  Report to [place] at [place] AM.  Call this number if you have problems the morning of surgery: [phone]   Remember:   Do not eat food or drink:After Midnight.    Take these medicines the morning of surgery with A SIP OF WATER: none   Do not wear jewelry, make-up or nail polish.  Do not wear lotions, powders, or perfumes.   Do not shave 48 hours prior to surgery.   Do not bring valuables to the hospital.  Contacts, dentures or bridgework may not be worn into surgery.  Leave suitcase in the car. After surgery it may be brought to your room.  For patients admitted to the hospital, checkout time is 11:00 AM the day of discharge.   Patients discharged the day of surgery will not be allowed to drive home.  Name and phone number of your driver:   Special Instructions: CHG Shower Use Special Wash: 1/2 bottle night before surgery and 1/2 bottle morning of surgery.   Please read over the following fact sheets that you were given: Pain Booklet, Coughing and Deep Breathing, Blood Transfusion Information, MRSA Information and Surgical Site Infection Prevention

## 2012-04-24 ENCOUNTER — Encounter (HOSPITAL_COMMUNITY): Payer: Self-pay

## 2012-04-24 ENCOUNTER — Ambulatory Visit (HOSPITAL_COMMUNITY)
Admission: RE | Admit: 2012-04-24 | Discharge: 2012-04-24 | Disposition: A | Discharge status: Home / Self Care | Payer: Medicare Other | Source: Ambulatory Visit | Attending: Thoracic Surgery (Cardiothoracic Vascular Surgery) | Admitting: Thoracic Surgery (Cardiothoracic Vascular Surgery)

## 2012-04-24 ENCOUNTER — Encounter (HOSPITAL_COMMUNITY)
Admission: RE | Admit: 2012-04-24 | Discharge: 2012-04-24 | Disposition: A | Discharge status: Home / Self Care | Payer: Medicare Other | Source: Ambulatory Visit | Attending: Thoracic Surgery (Cardiothoracic Vascular Surgery) | Admitting: Thoracic Surgery (Cardiothoracic Vascular Surgery)

## 2012-04-24 ENCOUNTER — Ambulatory Visit (INDEPENDENT_AMBULATORY_CARE_PROVIDER_SITE_OTHER): Payer: Medicare Other | Admitting: Internal Medicine

## 2012-04-24 ENCOUNTER — Encounter: Payer: Self-pay | Admitting: Internal Medicine

## 2012-04-24 ENCOUNTER — Inpatient Hospital Stay (HOSPITAL_COMMUNITY): Admission: RE | Admit: 2012-04-24 | Payer: Medicare Other | Source: Ambulatory Visit

## 2012-04-24 ENCOUNTER — Other Ambulatory Visit (HOSPITAL_COMMUNITY): Payer: Medicare Other

## 2012-04-24 VITALS — BP 118/72 | HR 80 | Ht 64.0 in | Wt 121.0 lb

## 2012-04-24 VITALS — BP 114/59 | HR 92 | Temp 98.2°F | Resp 20 | Ht 64.0 in | Wt 121.7 lb

## 2012-04-24 DIAGNOSIS — Z01818 Encounter for other preprocedural examination: Secondary | ICD-10-CM | POA: Insufficient documentation

## 2012-04-24 DIAGNOSIS — J984 Other disorders of lung: Secondary | ICD-10-CM | POA: Insufficient documentation

## 2012-04-24 DIAGNOSIS — Z01812 Encounter for preprocedural laboratory examination: Secondary | ICD-10-CM | POA: Insufficient documentation

## 2012-04-24 DIAGNOSIS — K648 Other hemorrhoids: Secondary | ICD-10-CM

## 2012-04-24 DIAGNOSIS — Z01811 Encounter for preprocedural respiratory examination: Secondary | ICD-10-CM | POA: Insufficient documentation

## 2012-04-24 DIAGNOSIS — R918 Other nonspecific abnormal finding of lung field: Secondary | ICD-10-CM

## 2012-04-24 HISTORY — DX: Unspecified asthma, uncomplicated: J45.909

## 2012-04-24 HISTORY — DX: Urinary tract infection, site not specified: N39.0

## 2012-04-24 LAB — TYPE AND SCREEN
ABO/RH(D): A POS
Antibody Screen: NEGATIVE

## 2012-04-24 LAB — CBC
Hemoglobin: 12.6 g/dL (ref 12.0–15.0)
MCH: 29.9 pg (ref 26.0–34.0)
MCHC: 33.2 g/dL (ref 30.0–36.0)

## 2012-04-24 LAB — URINALYSIS, ROUTINE W REFLEX MICROSCOPIC
Bilirubin Urine: NEGATIVE
Hgb urine dipstick: NEGATIVE
Protein, ur: 30 mg/dL — AB
Urobilinogen, UA: 0.2 mg/dL (ref 0.0–1.0)

## 2012-04-24 LAB — COMPREHENSIVE METABOLIC PANEL
ALT: 47 U/L — ABNORMAL HIGH (ref 0–35)
BUN: 17 mg/dL (ref 6–23)
Calcium: 9 mg/dL (ref 8.4–10.5)
GFR calc Af Amer: 41 mL/min — ABNORMAL LOW (ref >90)
Glucose, Bld: 90 mg/dL (ref 70–99)
Sodium: 143 mEq/L (ref 135–145)
Total Protein: 6.9 g/dL (ref 6.0–8.3)

## 2012-04-24 LAB — ABO/RH: ABO/RH(D): A POS

## 2012-04-24 LAB — PROTIME-INR: Prothrombin Time: 13 seconds (ref 11.6–15.2)

## 2012-04-24 LAB — URINE MICROSCOPIC-ADD ON

## 2012-04-24 MED ORDER — DEXTROSE 5 % IV SOLN
1.5000 g | INTRAVENOUS | Status: AC
  Administered 2012-04-25: 1.5 g via INTRAVENOUS
  Filled 2012-04-24: qty 1.5

## 2012-04-24 NOTE — Progress Notes (Signed)
Desiree Adams 1946/09/21 MRN [id number]  History of Present Illness:  This is a 66 year old white female who is scheduled for a resection of a right lung lesion tomorrow by Dr. Roxan Hockey. 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 She was scheduled for a colon resection and ileostomy but the right lung mass was noted on a CT scan of the chest and needs to be resected first; she is here today because of prolapsing rectal tissue which is a new finding. It is very tender and there is swelling in the rectal area.   Past Medical History  Diagnosis Date  . Ovarian cancer   . Small bowel obstruction   . 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 History of Clostridium difficile infection   . Vitamin B12 deficiency   . Biliary colic   . PONV (postoperative nausea and vomiting)     always nausea and  vomiting  . Difficult intubation   . Right lower lobe lung mass    Past Surgical History  Procedure Date  . Total abdominal hysterectomy w/ bilateral salpingoophorectomy   . Appendectomy   . Laparoscopic cholecystectomy   . Breast enhancement surgery   . Exploratory laparotomy w/ bowel resection   . Colonoscopy 12/25/2011    Procedure: COLONOSCOPY;  Surgeon: Lafayette Dragon, MD;  Location: [facility];  Service: Endoscopy;  Laterality: N/A;    reports that she has never smoked. She has never used smokeless tobacco. She reports that she drinks alcohol. She reports that she does not use illicit drugs. family history includes Kidney cancer in an unspecified family member.  There is no history of Colon cancer. Allergies  Allergen Reactions  . Sulfamethoxazole     REACTION: unspecified        Review of Systems:  The remainder of the 10 point ROS is negative  except as outlined in H&P   Physical Exam: General appearance  Well developed, in no distress. Eyes- non icteric. HEENT nontraumatic, normocephalic. Mouth no lesions, tongue papillated, no cheilosis. Neck supple without adenopathy, thyroid not enlarged, no carotid bruits, no JVD. Lungs Clear to auscultation bilaterally. Cor normal S1, normal S2, regular rhythm, no murmur,  quiet precordium. Abdomen: Soft nontender with normoactive bowel sounds. Rectal: Thrombosed hemorrhoid at 3:00. Very tender. Measures  1x1 cm and is firm. There is no active bleeding. The anal canal is tender. Stool is Hemoccult negative., macerated mucosa around the rectum Extremities no pedal edema. Skin no lesions. Neurological alert and oriented x 3. Psychological normal mood and affect.  Assessment and Plan:  Problem #1 Thrombosed hemorrhoid most likely activated by chronic diarrhea. She will use sitz baths. She will also use Recticare samples which we have given her. She will be admitted to the hospital tomorrow for lung surgery but and will continue sitz baths and rectal care.while in the hospital. The hemorrhoids may have to be evacuated if  they continue to bother her. Problem #2 lung mass. She is scheduled for surgery tomorrow  Problem #3 radiation enteritis. See my previous notes and  Dr. Bertrum Sol notes.  04/24/2012 [name]

## 2012-04-24 NOTE — Progress Notes (Signed)
Forwarded EKG to Dr. Conrad [place] for review.

## 2012-04-24 NOTE — Progress Notes (Signed)
Dr. Conrad [place] reviewed EKG,okay to proceed.

## 2012-04-24 NOTE — Patient Instructions (Addendum)
Dr Zella Richer, Dr Roxan Hockey

## 2012-04-25 ENCOUNTER — Ambulatory Visit (HOSPITAL_COMMUNITY): Payer: Medicare Other | Admitting: Anesthesiology

## 2012-04-25 ENCOUNTER — Encounter (HOSPITAL_COMMUNITY): Admission: RE | Payer: Self-pay | Source: Ambulatory Visit

## 2012-04-25 ENCOUNTER — Ambulatory Visit (HOSPITAL_COMMUNITY): Admission: RE | Admit: 2012-04-25 | Payer: Medicare Other | Source: Ambulatory Visit | Admitting: General Surgery

## 2012-04-25 ENCOUNTER — Encounter (HOSPITAL_COMMUNITY): Payer: Self-pay | Admitting: Anesthesiology

## 2012-04-25 ENCOUNTER — Encounter (HOSPITAL_COMMUNITY)
Admission: RE | Disposition: A | Discharge status: Home / Self Care | Payer: Self-pay | Source: Ambulatory Visit | Attending: Thoracic Surgery (Cardiothoracic Vascular Surgery)

## 2012-04-25 ENCOUNTER — Inpatient Hospital Stay (HOSPITAL_COMMUNITY): Payer: Medicare Other

## 2012-04-25 ENCOUNTER — Encounter (HOSPITAL_COMMUNITY): Payer: Self-pay | Admitting: *Deleted

## 2012-04-25 ENCOUNTER — Inpatient Hospital Stay (HOSPITAL_COMMUNITY)
Admission: RE | Admit: 2012-04-25 | Discharge: 2012-04-30 | DRG: 164 | Disposition: A | Discharge status: Home / Self Care | Payer: Medicare Other | Source: Ambulatory Visit | Attending: Thoracic Surgery (Cardiothoracic Vascular Surgery) | Admitting: Thoracic Surgery (Cardiothoracic Vascular Surgery)

## 2012-04-25 DIAGNOSIS — D3A090 Benign carcinoid tumor of the bronchus and lung: Principal | ICD-10-CM | POA: Diagnosis present

## 2012-04-25 DIAGNOSIS — Z923 Personal history of irradiation: Secondary | ICD-10-CM

## 2012-04-25 DIAGNOSIS — E538 Deficiency of other specified B group vitamins: Secondary | ICD-10-CM | POA: Diagnosis present

## 2012-04-25 DIAGNOSIS — K520 Gastroenteritis and colitis due to radiation: Secondary | ICD-10-CM | POA: Diagnosis present

## 2012-04-25 DIAGNOSIS — Y842 Radiological procedure and radiotherapy as the cause of abnormal reaction of the patient, or of later complication, without mention of misadventure at the time of the procedure: Secondary | ICD-10-CM | POA: Diagnosis present

## 2012-04-25 DIAGNOSIS — Z01818 Encounter for other preprocedural examination: Secondary | ICD-10-CM

## 2012-04-25 DIAGNOSIS — T50995A Adverse effect of other drugs, medicaments and biological substances, initial encounter: Secondary | ICD-10-CM | POA: Diagnosis present

## 2012-04-25 DIAGNOSIS — Z79899 Other long term (current) drug therapy: Secondary | ICD-10-CM

## 2012-04-25 DIAGNOSIS — R918 Other nonspecific abnormal finding of lung field: Secondary | ICD-10-CM

## 2012-04-25 DIAGNOSIS — Z01812 Encounter for preprocedural laboratory examination: Secondary | ICD-10-CM

## 2012-04-25 DIAGNOSIS — D381 Neoplasm of uncertain behavior of trachea, bronchus and lung: Secondary | ICD-10-CM

## 2012-04-25 DIAGNOSIS — Z8543 Personal history of malignant neoplasm of ovary: Secondary | ICD-10-CM

## 2012-04-25 DIAGNOSIS — K5289 Other specified noninfective gastroenteritis and colitis: Secondary | ICD-10-CM | POA: Diagnosis present

## 2012-04-25 HISTORY — PX: VIDEO ASSISTED THORACOSCOPY: SHX5073

## 2012-04-25 LAB — BLOOD GAS, ARTERIAL
Patient temperature: 98.6
TCO2: 22.7 mmol/L (ref 0–100)
pH, Arterial: 7.378 (ref 7.350–7.450)

## 2012-04-25 SURGERY — VIDEO ASSISTED THORACOSCOPY (VATS)/ LOBECTOMY
Anesthesia: General | Site: Chest | Laterality: Right | Wound class: Clean Contaminated

## 2012-04-25 SURGERY — COLON RESECTION LAPAROSCOPIC
Anesthesia: General

## 2012-04-25 MED ORDER — MEPERIDINE HCL 25 MG/ML IJ SOLN: 6.2500 mg | INTRAMUSCULAR | Status: DC | PRN

## 2012-04-25 MED ORDER — FENTANYL CITRATE 0.05 MG/ML IJ SOLN
INTRAMUSCULAR | Status: DC | PRN
  Administered 2012-04-25 (×3): 100 ug via INTRAVENOUS
  Administered 2012-04-25 (×2): 50 ug via INTRAVENOUS

## 2012-04-25 MED ORDER — DROPERIDOL 2.5 MG/ML IJ SOLN
INTRAMUSCULAR | Status: DC | PRN
  Administered 2012-04-25: 0.625 mg via INTRAVENOUS

## 2012-04-25 MED ORDER — DEXTROSE 5 % IV SOLN
1.5000 g | Freq: Two times a day (BID) | INTRAVENOUS | Status: AC
  Administered 2012-04-25 – 2012-04-26 (×2): 1.5 g via INTRAVENOUS
  Filled 2012-04-25 (×2): qty 1.5

## 2012-04-25 MED ORDER — BISACODYL 5 MG PO TBEC
10.0000 mg | DELAYED_RELEASE_TABLET | Freq: Every day | ORAL | Status: DC
  Filled 2012-04-25: qty 2

## 2012-04-25 MED ORDER — LOPERAMIDE HCL 2 MG PO CAPS
2.0000 mg | ORAL_CAPSULE | Freq: Two times a day (BID) | ORAL | Status: DC
  Administered 2012-04-26 – 2012-04-27 (×3): 2 mg via ORAL
  Filled 2012-04-25 (×12): qty 1

## 2012-04-25 MED ORDER — LIDOCAINE HCL (CARDIAC) 20 MG/ML IV SOLN
INTRAVENOUS | Status: DC | PRN
  Administered 2012-04-25 (×2): 50 mg via INTRAVENOUS

## 2012-04-25 MED ORDER — KETOROLAC TROMETHAMINE 30 MG/ML IJ SOLN: 15.0000 mg | Freq: Once | INTRAMUSCULAR | Status: DC | PRN

## 2012-04-25 MED ORDER — PROPOFOL 10 MG/ML IV EMUL
INTRAVENOUS | Status: DC | PRN
  Administered 2012-04-25: 20 mg via INTRAVENOUS
  Administered 2012-04-25: 130 mg via INTRAVENOUS

## 2012-04-25 MED ORDER — VECURONIUM BROMIDE 10 MG IV SOLR: INTRAVENOUS | Status: DC | PRN

## 2012-04-25 MED ORDER — ACETAMINOPHEN 10 MG/ML IV SOLN
1000.0000 mg | Freq: Four times a day (QID) | INTRAVENOUS | Status: AC
  Administered 2012-04-25 – 2012-04-26 (×4): 1000 mg via INTRAVENOUS
  Filled 2012-04-25 (×4): qty 100

## 2012-04-25 MED ORDER — DIPHENHYDRAMINE HCL 12.5 MG/5ML PO ELIX
12.5000 mg | ORAL_SOLUTION | Freq: Four times a day (QID) | ORAL | Status: DC | PRN
  Filled 2012-04-25: qty 5

## 2012-04-25 MED ORDER — 0.9 % SODIUM CHLORIDE (POUR BTL) OPTIME
TOPICAL | Status: DC | PRN
  Administered 2012-04-25: 4000 mL

## 2012-04-25 MED ORDER — PAREGORIC 2 MG/5ML PO TINC: 5.0000 mL | Freq: Four times a day (QID) | ORAL | Status: DC | PRN

## 2012-04-25 MED ORDER — HYDROMORPHONE HCL PF 1 MG/ML IJ SOLN
0.2500 mg | INTRAMUSCULAR | Status: DC | PRN
  Administered 2012-04-25 (×4): 0.5 mg via INTRAVENOUS

## 2012-04-25 MED ORDER — ONDANSETRON HCL 4 MG/2ML IJ SOLN
4.0000 mg | Freq: Four times a day (QID) | INTRAMUSCULAR | Status: DC | PRN
  Filled 2012-04-25: qty 2

## 2012-04-25 MED ORDER — ADULT MULTIVITAMIN W/MINERALS CH
1.0000 | ORAL_TABLET | Freq: Every day | ORAL | Status: DC
  Administered 2012-04-26 – 2012-04-29 (×4): 1 via ORAL
  Filled 2012-04-25 (×6): qty 1

## 2012-04-25 MED ORDER — LACTATED RINGERS IV SOLN
INTRAVENOUS | Status: DC | PRN
  Administered 2012-04-25: 07:00:00 via INTRAVENOUS

## 2012-04-25 MED ORDER — SODIUM CHLORIDE 0.9 % IJ SOLN: 9.0000 mL | INTRAMUSCULAR | Status: DC | PRN

## 2012-04-25 MED ORDER — NEOSTIGMINE METHYLSULFATE 1 MG/ML IJ SOLN
INTRAMUSCULAR | Status: DC | PRN
  Administered 2012-04-25: 5 mg via INTRAVENOUS

## 2012-04-25 MED ORDER — DEXTROSE 5 % IV SOLN: INTRAVENOUS | Status: DC | PRN

## 2012-04-25 MED ORDER — FENTANYL 10 MCG/ML IV SOLN
INTRAVENOUS | Status: DC
  Administered 2012-04-25: 14:00:00 via INTRAVENOUS
  Administered 2012-04-25: 40 ug via INTRAVENOUS
  Administered 2012-04-26: 50 ug via INTRAVENOUS
  Administered 2012-04-26: 70 ug via INTRAVENOUS
  Administered 2012-04-26: 30 ug via INTRAVENOUS
  Administered 2012-04-26: 80 ug via INTRAVENOUS
  Administered 2012-04-26: 10 ug via INTRAVENOUS
  Administered 2012-04-26: 100 ug via INTRAVENOUS
  Administered 2012-04-27 (×2): 40 ug via INTRAVENOUS
  Administered 2012-04-27: 50 ug via INTRAVENOUS
  Administered 2012-04-27: 70 ug via INTRAVENOUS
  Administered 2012-04-27: 06:00:00 via INTRAVENOUS
  Administered 2012-04-27: 10 ug via INTRAVENOUS
  Administered 2012-04-27: 60 ug via INTRAVENOUS
  Administered 2012-04-28 (×2): 20 ug via INTRAVENOUS
  Administered 2012-04-28: 30 ug via INTRAVENOUS
  Filled 2012-04-25 (×3): qty 50

## 2012-04-25 MED ORDER — DIPHENHYDRAMINE HCL 50 MG/ML IJ SOLN
12.5000 mg | Freq: Four times a day (QID) | INTRAMUSCULAR | Status: DC | PRN
  Filled 2012-04-25: qty 0.25

## 2012-04-25 MED ORDER — BUPIVACAINE 0.5 % ON-Q PUMP SINGLE CATH 400 ML
400.0000 mL | INJECTION | Status: DC
  Filled 2012-04-25: qty 400

## 2012-04-25 MED ORDER — KCL IN DEXTROSE-NACL 20-5-0.45 MEQ/L-%-% IV SOLN
INTRAVENOUS | Status: AC
  Filled 2012-04-25: qty 1000

## 2012-04-25 MED ORDER — DEXTROSE 5 % IV SOLN
INTRAVENOUS | Status: DC | PRN
  Administered 2012-04-25: 09:00:00 via INTRAVENOUS

## 2012-04-25 MED ORDER — GLYCOPYRROLATE 0.2 MG/ML IJ SOLN
INTRAMUSCULAR | Status: DC | PRN
  Administered 2012-04-25: .6 mg via INTRAVENOUS

## 2012-04-25 MED ORDER — BUPIVACAINE HCL (PF) 0.5 % IJ SOLN
INTRAMUSCULAR | Status: DC | PRN
  Administered 2012-04-25: 5 mL

## 2012-04-25 MED ORDER — TRAMADOL HCL 50 MG PO TABS
50.0000 mg | ORAL_TABLET | Freq: Four times a day (QID) | ORAL | Status: DC | PRN
  Administered 2012-04-25 – 2012-04-30 (×7): 100 mg via ORAL
  Filled 2012-04-25 (×7): qty 2

## 2012-04-25 MED ORDER — BUPIVACAINE ON-Q PAIN PUMP (FOR ORDER SET NO CHG)
INJECTION | Status: DC
  Filled 2012-04-25: qty 1

## 2012-04-25 MED ORDER — KCL IN DEXTROSE-NACL 20-5-0.45 MEQ/L-%-% IV SOLN
INTRAVENOUS | Status: DC
  Administered 2012-04-25: 13:00:00 via INTRAVENOUS
  Administered 2012-04-26: 20 mL via INTRAVENOUS
  Administered 2012-04-26: via INTRAVENOUS
  Filled 2012-04-25 (×5): qty 1000

## 2012-04-25 MED ORDER — PROMETHAZINE HCL 25 MG/ML IJ SOLN: 6.2500 mg | INTRAMUSCULAR | Status: DC | PRN

## 2012-04-25 MED ORDER — BUPIVACAINE HCL (PF) 0.5 % IJ SOLN
INTRAMUSCULAR | Status: AC
  Filled 2012-04-25: qty 30

## 2012-04-25 MED ORDER — SODIUM CHLORIDE 0.9 % IV SOLN
10.0000 mg | INTRAVENOUS | Status: DC | PRN
  Administered 2012-04-25: 25 ug/min via INTRAVENOUS

## 2012-04-25 MED ORDER — DEXAMETHASONE SODIUM PHOSPHATE 4 MG/ML IJ SOLN
INTRAMUSCULAR | Status: DC | PRN
  Administered 2012-04-25: 8 mg via INTRAVENOUS

## 2012-04-25 MED ORDER — ONDANSETRON HCL 4 MG/2ML IJ SOLN
4.0000 mg | Freq: Four times a day (QID) | INTRAMUSCULAR | Status: DC | PRN
  Administered 2012-04-26 – 2012-04-30 (×5): 4 mg via INTRAVENOUS
  Filled 2012-04-25 (×5): qty 2

## 2012-04-25 MED ORDER — NALOXONE HCL 0.4 MG/ML IJ SOLN
0.4000 mg | INTRAMUSCULAR | Status: DC | PRN
  Filled 2012-04-25: qty 1

## 2012-04-25 MED ORDER — SODIUM CHLORIDE 0.9 % IJ SOLN
10.0000 mL | Freq: Two times a day (BID) | INTRAMUSCULAR | Status: DC
  Administered 2012-04-26 – 2012-04-28 (×5): 10 mL
  Filled 2012-04-25: qty 10

## 2012-04-25 MED ORDER — HYDROMORPHONE HCL PF 1 MG/ML IJ SOLN
INTRAMUSCULAR | Status: AC
  Filled 2012-04-25: qty 1

## 2012-04-25 MED ORDER — BUPIVACAINE 0.5 % ON-Q PUMP SINGLE CATH 400 ML
INJECTION | Status: DC | PRN
  Administered 2012-04-25: 400 mL

## 2012-04-25 MED ORDER — SENNOSIDES-DOCUSATE SODIUM 8.6-50 MG PO TABS
1.0000 | ORAL_TABLET | Freq: Every evening | ORAL | Status: DC | PRN
  Filled 2012-04-25: qty 1

## 2012-04-25 MED ORDER — VECURONIUM BROMIDE 10 MG IV SOLR
INTRAVENOUS | Status: DC | PRN
  Administered 2012-04-25: 2 mg via INTRAVENOUS
  Administered 2012-04-25: 10 mg via INTRAVENOUS
  Administered 2012-04-25: 2 mg via INTRAVENOUS

## 2012-04-25 MED ORDER — OXYCODONE-ACETAMINOPHEN 5-325 MG PO TABS
1.0000 | ORAL_TABLET | ORAL | Status: DC | PRN
  Administered 2012-04-26: 1 via ORAL
  Administered 2012-04-27: 2 via ORAL
  Administered 2012-04-28 (×2): 1 via ORAL
  Administered 2012-04-28: 2 via ORAL
  Administered 2012-04-29 (×3): 1 via ORAL
  Administered 2012-04-29: 2 via ORAL
  Administered 2012-04-29: 1 via ORAL
  Administered 2012-04-29: 2 via ORAL
  Administered 2012-04-30: 1 via ORAL
  Filled 2012-04-25: qty 1
  Filled 2012-04-25: qty 2
  Filled 2012-04-25: qty 1
  Filled 2012-04-25 (×2): qty 2
  Filled 2012-04-25 (×2): qty 1
  Filled 2012-04-25 (×2): qty 2
  Filled 2012-04-25: qty 1
  Filled 2012-04-25 (×2): qty 2
  Filled 2012-04-25: qty 1

## 2012-04-25 MED ORDER — ALBUTEROL SULFATE HFA 108 (90 BASE) MCG/ACT IN AERS
4.0000 | INHALATION_SPRAY | Freq: Four times a day (QID) | RESPIRATORY_TRACT | Status: DC
  Administered 2012-04-25 – 2012-04-26 (×2): 4 via RESPIRATORY_TRACT
  Filled 2012-04-25: qty 6.7

## 2012-04-25 MED ORDER — SODIUM CHLORIDE 0.9 % IJ SOLN
INTRAMUSCULAR | Status: AC
  Administered 2012-04-26: 10 mL
  Filled 2012-04-25: qty 20

## 2012-04-25 MED ORDER — ONDANSETRON HCL 4 MG/2ML IJ SOLN
INTRAMUSCULAR | Status: DC | PRN
  Administered 2012-04-25 (×2): 4 mg via INTRAVENOUS

## 2012-04-25 MED ORDER — POTASSIUM CHLORIDE 10 MEQ/50ML IV SOLN: 10.0000 meq | Freq: Every day | INTRAVENOUS | Status: DC | PRN

## 2012-04-25 MED ORDER — MIDAZOLAM HCL 5 MG/5ML IJ SOLN
INTRAMUSCULAR | Status: DC | PRN
  Administered 2012-04-25 (×2): 1 mg via INTRAVENOUS

## 2012-04-25 MED ORDER — SODIUM CHLORIDE 0.9 % IJ SOLN
10.0000 mL | INTRAMUSCULAR | Status: DC | PRN
  Filled 2012-04-25 (×2): qty 20
  Filled 2012-04-25: qty 10

## 2012-04-25 MED ORDER — OXYCODONE HCL 5 MG PO TABS
5.0000 mg | ORAL_TABLET | ORAL | Status: AC | PRN
  Administered 2012-04-26: 5 mg via ORAL
  Filled 2012-04-25: qty 1

## 2012-04-25 MED ORDER — DEXTROSE 5 % IV SOLN
1.0000 g | INTRAVENOUS | Status: DC
  Filled 2012-04-25: qty 1

## 2012-04-25 MED ORDER — EPHEDRINE SULFATE 50 MG/ML IJ SOLN
INTRAMUSCULAR | Status: DC | PRN
  Administered 2012-04-25: 5 mg via INTRAVENOUS

## 2012-04-25 SURGICAL SUPPLY — 79 items
APPLIER CLIP ROT 10 11.4 M/L (STAPLE)
APR CLP MED LRG 11.4X10 (STAPLE)
BLADE SURG 15 STRL LF DISP TIS (BLADE) IMPLANT
BLADE SURG 15 STRL SS (BLADE) ×2
CANISTER SUCTION 2500CC (MISCELLANEOUS) ×4 IMPLANT
CATH KIT ON Q 5IN SLV (PAIN MANAGEMENT) IMPLANT
CATH THORACIC 28FR (CATHETERS) ×1 IMPLANT
CATH THORACIC 28FR RT ANG (CATHETERS) ×2 IMPLANT
CATH THORACIC 36FR (CATHETERS) IMPLANT
CATH THORACIC 36FR RT ANG (CATHETERS) ×1 IMPLANT
CLIP APPLIE ROT 10 11.4 M/L (STAPLE) IMPLANT
CLIP TI MEDIUM 6 (CLIP) ×2 IMPLANT
CLOTH BEACON ORANGE TIMEOUT ST (SAFETY) ×2 IMPLANT
CONN ST 1/4X3/8  BEN (MISCELLANEOUS) ×1
CONN ST 1/4X3/8 BEN (MISCELLANEOUS) IMPLANT
CONN Y 3/8X3/8X3/8  BEN (MISCELLANEOUS) ×1
CONN Y 3/8X3/8X3/8 BEN (MISCELLANEOUS) ×1 IMPLANT
CONT SPEC 4OZ CLIKSEAL STRL BL (MISCELLANEOUS) ×11 IMPLANT
DRAPE LAPAROSCOPIC ABDOMINAL (DRAPES) ×2 IMPLANT
DRAPE SLUSH MACHINE 52X66 (DRAPES) IMPLANT
DRAPE SLUSH/WARMER DISC (DRAPES) IMPLANT
ELECT REM PT RETURN 9FT ADLT (ELECTROSURGICAL) ×2
ELECTRODE REM PT RTRN 9FT ADLT (ELECTROSURGICAL) ×1 IMPLANT
GAUZE SPONGE 4X4 16PLY XRAY LF (GAUZE/BANDAGES/DRESSINGS) ×1 IMPLANT
GLOVE BIOGEL PI IND STRL 6.5 (GLOVE) IMPLANT
GLOVE BIOGEL PI INDICATOR 6.5 (GLOVE) ×1
GLOVE ECLIPSE 7.5 STRL STRAW (GLOVE) ×2 IMPLANT
GLOVE EUDERMIC 7 POWDERFREE (GLOVE) ×4 IMPLANT
GOWN PREVENTION PLUS XLARGE (GOWN DISPOSABLE) ×2 IMPLANT
GOWN STRL NON-REIN LRG LVL3 (GOWN DISPOSABLE) ×4 IMPLANT
HANDLE STAPLE ENDO GIA SHORT (STAPLE) ×2
HEMOSTAT SURGICEL 2X14 (HEMOSTASIS) IMPLANT
KIT BASIN OR (CUSTOM PROCEDURE TRAY) ×2 IMPLANT
KIT ROOM TURNOVER OR (KITS) ×2 IMPLANT
KIT SUCTION CATH 14FR (SUCTIONS) ×2 IMPLANT
NS IRRIG 1000ML POUR BTL (IV SOLUTION) ×4 IMPLANT
PACK CHEST (CUSTOM PROCEDURE TRAY) ×2 IMPLANT
PAD ARMBOARD 7.5X6 YLW CONV (MISCELLANEOUS) ×4 IMPLANT
POUCH ENDO CATCH II 15MM (MISCELLANEOUS) ×1 IMPLANT
RELOAD EGIA 45 TAN VASC (STAPLE) ×1 IMPLANT
RELOAD EGIA 60 MED/THCK PURPLE (STAPLE) ×2 IMPLANT
RELOAD EGIA TRIS TAN 45 CVD (STAPLE) ×2 IMPLANT
RELOAD STAPLE 45 TAN MED CVD (STAPLE) IMPLANT
RELOAD STAPLE 60 MED/THCK ART (STAPLE) IMPLANT
SEALANT PROGEL (MISCELLANEOUS) IMPLANT
SEALANT SURG COSEAL 4ML (VASCULAR PRODUCTS) IMPLANT
SEALANT SURG COSEAL 8ML (VASCULAR PRODUCTS) IMPLANT
SOLUTION ANTI FOG 6CC (MISCELLANEOUS) ×4 IMPLANT
SPECIMEN JAR MEDIUM (MISCELLANEOUS) ×2 IMPLANT
SPONGE GAUZE 4X4 12PLY (GAUZE/BANDAGES/DRESSINGS) ×1 IMPLANT
SPONGE INTESTINAL PEANUT (DISPOSABLE) IMPLANT
STAPLER ENDO GIA 12 SHRT THIN (STAPLE) IMPLANT
STAPLER ENDO GIA 12MM SHORT (STAPLE) ×2 IMPLANT
SUT PROLENE 4 0 RB 1 (SUTURE) ×2
SUT PROLENE 4-0 RB1 .5 CRCL 36 (SUTURE) IMPLANT
SUT SILK  1 MH (SUTURE) ×2
SUT SILK 1 MH (SUTURE) ×2 IMPLANT
SUT SILK 2 0SH CR/8 30 (SUTURE) IMPLANT
SUT SILK 3 0SH CR/8 30 (SUTURE) IMPLANT
SUT VIC AB 1 CTX 36 (SUTURE) ×4
SUT VIC AB 1 CTX36XBRD ANBCTR (SUTURE) IMPLANT
SUT VIC AB 2-0 CTX 36 (SUTURE) ×1 IMPLANT
SUT VIC AB 2-0 UR6 27 (SUTURE) IMPLANT
SUT VIC AB 3-0 MH 27 (SUTURE) IMPLANT
SUT VIC AB 3-0 X1 27 (SUTURE) ×4 IMPLANT
SUT VICRYL 2 TP 1 (SUTURE) IMPLANT
SWAB COLLECTION DEVICE MRSA (MISCELLANEOUS) IMPLANT
SYR 5ML LL (SYRINGE) ×1 IMPLANT
SYSTEM SAHARA CHEST DRAIN ATS (WOUND CARE) ×2 IMPLANT
TAPE CLOTH 4X10 WHT NS (GAUZE/BANDAGES/DRESSINGS) ×1 IMPLANT
TAPE CLOTH SURG 4X10 WHT LF (GAUZE/BANDAGES/DRESSINGS) ×1 IMPLANT
TIP APPLICATOR SPRAY EXTEND 16 (VASCULAR PRODUCTS) IMPLANT
TOWEL OR 17X24 6PK STRL BLUE (TOWEL DISPOSABLE) ×2 IMPLANT
TOWEL OR 17X26 10 PK STRL BLUE (TOWEL DISPOSABLE) ×2 IMPLANT
TRAP SPECIMEN MUCOUS 40CC (MISCELLANEOUS) IMPLANT
TRAY FOLEY CATH 14FRSI W/METER (CATHETERS) ×2 IMPLANT
TUBE ANAEROBIC SPECIMEN COL (MISCELLANEOUS) IMPLANT
TUNNELER SHEATH ON-Q 11GX8 (MISCELLANEOUS) ×3 IMPLANT
WATER STERILE IRR 1000ML POUR (IV SOLUTION) ×3 IMPLANT

## 2012-04-25 NOTE — Op Note (Signed)
NAMECAMARI, SCHWANDT NO.:  [id number]  MEDICAL RECORD NO.:  [id number]  LOCATION:  59                         FACILITY:  [facility]  PHYSICIAN:  Revonda Standard. Roxan Hockey, M.D.DATE OF BIRTH:  1946/05/07  DATE OF PROCEDURE:  04/25/2012 DATE OF DISCHARGE:                              OPERATIVE REPORT   PREOPERATIVE DIAGNOSIS:  Hypermetabolic mass, right lower lobe.  POSTOPERATIVE DIAGNOSIS:  Likely malignancy, probable carcinoma, question type.  PROCEDURE:  Right video-assisted thoracoscopic lower lobectomy and On-Q local anesthetic catheter placement.  SURGEON:  Revonda Standard. Roxan Hockey, MD  ASSISTANT:  Suzzanne Cloud, PA  ANESTHESIA:  General.  FINDINGS:  Soft mass within the right lower lobe.  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 Margins clear.  CLINICAL NOTE:  Ms. Flesner is a 66 year old woman with a remote history of ovarian cancer, who has had a right lower lobe mass apparently for approximately 10 years, this recently was shown to be 3.3 cm in diameter and was a smooth, rounded mass.  A PET-CT showed the lesion to be hypermetabolic and the patient was advised to have this surgically resected, due to the risk that it could be malignant. The indications, risks, benefits, and alternatives were discussed in detail with the patient.  She understood and accepted the risks of surgery as outlined in the history and physical, and agreed to proceed.  OPERATIVE NOTE:  Mrs. Sammis was brought to the preoperative holding area on April 25, 2012, and Anesthesia placed a central line and an arterial blood pressure monitoring line.  Intravenous antibiotics were administered.  She was taken to the operating room, anesthetized, and intubated.  She has a difficult intubation and a double-lumen endotracheal tube could not be placed.  A bronchial blocker was subsequently placed and positioning confirmed with bronchoscopy.  The right  mainstem bronchus was blocked and she was placed in a left lateral decubitus position and the right chest was prepped and draped in usual sterile fashion.  The patient did tolerate single lung ventilation of the left lung well throughout the procedure.  An incision was made in the right chest in approximately the seventh intercostal space, it was carried through the skin and subcutaneous tissue.  Hemostasis was achieved with electrocautery.  The chest was entered bluntly using a hemostat.  A port was inserted and the thoracoscope was placed into the chest, there was good visualization. There was good separation with no gross ventilation of the right lung. There was no abnormality of the visceral or parietal pleura.  There was no pleural effusion.  An incision was made below the tip of the scapula for instrumentation. A small utility incision was made anterolaterally, this was approximately 6 cm in length.  The skin and subcutaneous tissue were divided.  Hemostasis was achieved with electrocautery.  The serratus muscular fibers were separated.  The intercostal muscles were incised with electrocautery.  No rib retraction was performed during the procedure.  The lower lobe was grasped and pulled superiorly.  The inferior pulmonary ligament was divided with electrocautery.  A small normal-appearing level 9 lymph node was identified and taken for permanent sections as well as a level  8 paraesophageal node.  The pleural reflection then was divided more proximally.  The patient had very little fat and nearly complete fissures and dissection was carried out without difficulty.  The pleural reflection then was divided more anteriorly as well to the level above the inferior pulmonary vein. While dissecting out the inferior pulmonary vein, a branch was inadvertently divided and required a 4-0 Prolene suture to oversew the origin of the branch from the inferior pulmonary vein.  The portion  of the major fissure between the middle lobe and lower lobe then was divided, this was mostly accomplished with electrocautery, although there was a small segment of lung tissue that was divided with an Endo- GIA stapler.  After dividing this, the right middle lobe branch of the pulmonary artery was easily identifiable and was preserved.  The lower lobe branches arose in two major trunks.  These were encircled and divided with an Endo-GIA vascular stapler.  Next, the inferior pulmonary vein was encircled and divided with an Endo-GIA stapler as well.  The remainder of the major fissure separating the superior segment of the lower lobe from the upper lobe was divided.  The lower lobe bronchus then was dissected out.  Again, the middle lobe bronchus was positively identified.  A stapler was placed across the lower lobe bronchus.  A test inflation showed good aeration of the upper and middle lobes.  The bronchial blocker then was reinflated.  The stapler was fired dividing the lower lobe bronchus.  The lower lobe then was placed into an endoscopic retrieval bag and removed through the anterior incision.  The specimen was inspected, there was amorphous mass present centrally within the lower lobe corresponding to the mass found on CT.  The mass was incised with combination of malignant-appearing and necrotic tissue. 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 Gloves and instruments that were used to inspect the specimen were discarded. While waiting the results for the frozen section, the subcarinal space was explored and a large, but otherwise benign-appearing lymph node was taken and sent as a permanent specimen and finally, the right paratracheal nodes were dissected out by incising the pleura superior to the azygos vein.  These nodes were also relatively normal in appearance. A test inflation to  30 cm of water revealed no leakage from the bronchial stump.  An On-Q local anesthetic catheter was placed through a separate incision posteriorly and tunneled into a subpleural location. It was secured with a 3-0 silk suture.  A 28-French chest tube was placed through a new port incision and secured with silk suture.  A 36- French right-angle chest tube was placed through the original port incision and secured with a silk suture as well.  The posterior stab incision was closed with a #1 Vicryl fascial suture and a 3-0 Vicryl subcuticular suture.  The anterior incision was closed with a #1 running Vicryl suture to reapproximate the serratus, a 2-0 Vicryl subcutaneous suture and a 3-0 Vicryl subcuticular suture.  It should be noted that the right upper and middle lobes were re-expanded while the scope was still in place to ensure good aeration of both prior to removing the scope and closing the final two incisions.  The patient then was extubated in the operating room.  She was taken to the postanesthetic care unit in good condition.  All sponge, needle, and instrument counts were correct at the end of the procedure.  Revonda Standard Roxan Hockey, M.D.     SCH/MEDQ  D:  04/25/2012  T:  04/25/2012  Job:  [id number]

## 2012-04-25 NOTE — Preoperative (Signed)
Beta Blockers   Reason not to administer Beta Blockers:Not Applicable 

## 2012-04-25 NOTE — H&P (View-Only) (Signed)
PCP is [name], MD Referring Provider is Rush Barer*  Chief Complaint  Patient presents with  . Lung Mass    eval and treat    HPI: 66 yo WF with a cc/o right lung mass.  A saber is a 66 year old woman with a history of ovarian cancer at age 89. This was treated with surgical resection and radiation. 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 Jackolyn Confer. Her preoperative evaluation a chest x-ray showed a right lung mass. On CT scan this was a 3.3 cm diameter smooth, round mass in the right lower lobe. A PET CT was done which showed this lesion to be hypermetabolic with an SUV of 6.6. Note she had been found to have a right lower lobe mass on CT back in 2001. She was evaluated for this at [facility] in no surgery or followup was recommended. The CT from 2001 is not available for review, but the report indicates that this was a 2 cm round mass.  Past Medical History  Diagnosis Date  . Ovarian cancer   . Small bowel obstruction   . 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 History of Clostridium difficile infection   . Vitamin B12 deficiency   . Biliary colic   . PONV (postoperative nausea and vomiting)     always nausea and  vomiting  . Difficult intubation   . Right lower lobe lung mass     Past Surgical History  Procedure Date  . Total abdominal hysterectomy w/ bilateral salpingoophorectomy   . Appendectomy   . Laparoscopic cholecystectomy   . Breast enhancement surgery   . Exploratory laparotomy w/ bowel resection   . Colonoscopy 12/25/2011    Procedure: COLONOSCOPY;  Surgeon: Lafayette Dragon, MD;  Location: [facility];  Service: Endoscopy;  Laterality: N/A;    Family History  Problem Relation Age of Onset  . Kidney cancer      ???? Grandfather  . Colon cancer Neg Hx     Social  History History  Substance Use Topics  . Smoking status: Never Smoker   . Smokeless tobacco: Never Used  . Alcohol Use: Yes     very rarely    Current Outpatient Prescriptions  Medication Sig Dispense Refill  . cyanocobalamin (,VITAMIN B-12,) 1000 MCG/ML injection Inject 1 ml or 1000 mcg IM once a week for 3 weeks and then once a month for 5 months  3 mL  5  . loperamide (IMODIUM) 2 MG capsule Take 2 mg by mouth 2 (two) times daily.      . Multiple Vitamin (MULTIVITAMIN) tablet Take 1 tablet by mouth daily.        Allergies  Allergen Reactions  . Sulfamethoxazole     REACTION: unspecified    Review of Systems  Constitutional: Positive for unexpected weight change (weight loss).  Respiratory: Negative.  Negative for cough, chest tightness, wheezing and stridor.   Cardiovascular: Negative.  Negative for chest pain and leg swelling.  Gastrointestinal:       Chronic diarrhea Radiation enteritis Needs ileostomy/ colostomy  Genitourinary:       TAH/ SBO for ovarian CA age 89 Radiation to pelvis  Neurological: Negative.   Hematological: Negative.   All other systems reviewed and are negative.    BP 106/70  Pulse 88  Resp 16  Ht 5'  4" (1.626 m)  Wt 121 lb (54.885 kg)  BMI 20.77 kg/m2  SpO2 98% Physical Exam  Constitutional: She is oriented to person, place, and time. She appears well-developed and well-nourished. No distress.  HENT:  Head: Normocephalic and atraumatic.  Eyes: EOM are normal. Pupils are equal, round, and reactive to light.  Neck: Neck supple. No thyromegaly present.  Cardiovascular: Normal rate, regular rhythm, normal heart sounds and intact distal pulses.  Exam reveals no gallop and no friction rub.   No murmur heard. Pulmonary/Chest: Effort normal and breath sounds normal. She has no wheezes. She has no rales.  Abdominal: Soft. There is no tenderness.  Musculoskeletal: Normal range of motion. She exhibits edema.  Lymphadenopathy:    She has no  cervical adenopathy.  Neurological: She is alert and oriented to person, place, and time. No cranial nerve deficit.  Skin: Skin is warm and dry.     Diagnostic Tests: CT chest 03/26/12  IMPRESSION:  1. Indeterminant mass within the right lower lobe. Cannot rule  out primary pulmonary neoplasm. Advise further evaluation with PET  CT.  2. Rounded soft tissue mass in the medial right breast adjacent to  the calcified breast implant. Correlate clinically and with  mammography.  PET/ CT 04/03/12  IMPRESSION:  1. Rounded hypermetabolic right lower lobe mass is consistent with  neoplasm.  2. No evidence of mediastinal metastasis or systemic metastasis.  3. Small right lower lobe pulmonary nodule is not hypermetabolic  but below the size limit for accurate PET characterization.  Recommend attention on follow-up.  4. Mild uptake associated with the inferior right breast mass is  felt likely to be benign.    Impression: 66 year old woman with a right lower lobe mass which is 3.3 cm in diameter and hypermetabolic with an SUV of 6.6. This lesion has been present since 2001, when it measured 2 cm. The lesion is very well circumscribed and rounded. And even though it is hypermetabolic on PET, I suspect that it is likely benign. 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 There is no clear time on a followup from 2001, so we cannot totally rule out that this lesion was initially benign and now has become more aggressive. However it is most likely that this is then very slowly growing since it was first discovered that time.  In any event, it does need to be removed surgically. The only question is the timing in regards to her planned ileostomy and colostomy. I had a long discussion with Ms. Juelfs and reviewed the scans with her. I feel strongly that the 2 operations should not be combined. I discussed with her the pros and cons of lung surgery followed by abdominal surgery and vice versa.  I do  think it would be best to do the lung surgery first. However, even though the lesion is hypermetabolic, it has been present for so long that I doubt she would be taking a major risk by proceeding with the abdominal surgery.   After discussion Ms. Ballog wishes to proceed with the pulmonary resection first, to be followed by the abdominal surgery after she has recovered. I discussed with her in detail the indications, risks, benefits, and alternative treatments. She understands that the risk of surgery include but are not limited to death, stroke, MI, DVT, PE, bleeding, possible need for transfusion, infection, prolonged air leak, as well as other unpredictable or a suitable, occasions. She is a good operative candidate and overall her risks with the  surgery are very low. We did discuss the expected hospital stay, limitations postoperatively, and expected return to full activities. She understands and accepts the risk of surgery and wishes to proceed with right vats, possible basilar segmentectomy or lower lobectomy.  Plan: Right VATS, possible segmentectomy or lobectomy on Thursday, July 25. She will be admitted on the day of surgery.

## 2012-04-25 NOTE — Anesthesia Preprocedure Evaluation (Signed)
Anesthesia Evaluation  Patient identified by MRN, date of birth, ID band Patient awake    History of Anesthesia Complications (+) PONV and DIFFICULT AIRWAY  Airway Mallampati: II  Neck ROM: Full    Dental  (+) Teeth Intact   Pulmonary asthma ,  breath sounds clear to auscultation        Cardiovascular negative cardio ROS  Rhythm:Regular Rate:Normal     Neuro/Psych    GI/Hepatic PUD,   Endo/Other  negative endocrine ROS  Renal/GU negative Renal ROS     Musculoskeletal negative musculoskeletal ROS (+)   Abdominal   Peds  Hematology negative hematology ROS (+)   Anesthesia Other Findings   Reproductive/Obstetrics                           Anesthesia Physical Anesthesia Plan  ASA: II  Anesthesia Plan: General   Post-op Pain Management:    Induction: Intravenous  Airway Management Planned:   Additional Equipment: CVP and Arterial line  Intra-op Plan:   Post-operative Plan:   Informed Consent: I have reviewed the patients History and Physical, chart, labs and discussed the procedure including the risks, benefits and alternatives for the proposed anesthesia with the patient or authorized representative who has indicated his/her understanding and acceptance.   Dental advisory given  Plan Discussed with: CRNA and Surgeon  Anesthesia Plan Comments:         Anesthesia Quick Evaluation

## 2012-04-25 NOTE — Brief Op Note (Addendum)
04/25/2012  11:26 AM  PATIENT:  Desiree Adams  66 y.o. female  PRE-OPERATIVE DIAGNOSIS:  RIGHT LOWER LOBE LUNG MASS  POST-OPERATIVE DIAGNOSIS:  RIGHT LOWER LOBE LUNG MASS  PROCEDURE:  Procedure(s): RIGHT VIDEO ASSISTED THORACOSCOPIC RIGHT LOWER LOBECTOMY, LYMPH NODE DISSECTION  SURGEON:  Surgeon(s): Melrose Nakayama, MD  ASSISTANT: Suzzanne Cloud, PA-C  ANESTHESIA:   general  SPECIMEN:  Source of Specimen:  right lower lobe, lymph nodes  DISPOSITION OF SPECIMEN:  Pathology  DRAINS: 28 Fr CT, 36 Fr CT  PATIENT CONDITION:  PACU - hemodynamically stable.   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

## 2012-04-25 NOTE — Transfer of Care (Signed)
Immediate Anesthesia Transfer of Care Note  Patient: Desiree Adams  Procedure(s) Performed: Procedure(s) (LRB): VIDEO ASSISTED THORACOSCOPY (VATS)/ LOBECTOMY (Right)  Patient Location: PACU  Anesthesia Type: General  Level of Consciousness: sedated, patient cooperative and responds to stimulation  Airway & Oxygen Therapy: Patient Spontanous Breathing and Patient connected to face mask oxygen  Post-op Assessment: Report given to PACU RN, Post -op Vital signs reviewed and stable, Patient moving all extremities and Patient moving all extremities X 4  Post vital signs: Reviewed and stable  Complications: No apparent anesthesia complications

## 2012-04-25 NOTE — Interval H&P Note (Signed)
History and Physical Interval Note:  04/25/2012 8:08 AM  Desiree Adams  has presented today for surgery, with the diagnosis of (R) LOWER LOBE LUNG MASS  The various methods of treatment have been discussed with the patient and family. After consideration of risks, benefits and other options for treatment, the patient has consented to  Procedure(s) (LRB): VIDEO ASSISTED THORACOSCOPY (VATS)/ LOBECTOMY (Right) as a surgical intervention .  The patient's history has been reviewed, patient examined, no change in status, stable for surgery.  I have reviewed the patient's chart and labs.  Questions were answered to the patient's satisfaction.     Bohdi Leeds C

## 2012-04-25 NOTE — Anesthesia Procedure Notes (Addendum)
Procedure Name: Intubation Date/Time: 04/25/2012 8:27 AM Performed by: Jacquiline Doe A Pre-anesthesia Checklist: Patient identified, Timeout performed, Emergency Drugs available, Suction available and Patient being monitored Patient Re-evaluated:Patient Re-evaluated prior to inductionOxygen Delivery Method: Circle system utilized Preoxygenation: Pre-oxygenation with 100% oxygen Intubation Type: IV induction and Cricoid Pressure applied Ventilation: Mask ventilation without difficulty Laryngoscope Size: Miller and 2 Grade View: Grade I Tube type: Oral Tube size: 7.5 mm Number of attempts: 2 Airway Equipment and Method: Bougie stylet and Stylet Placement Confirmation: ETT inserted through vocal cords under direct vision,  breath sounds checked- equal and bilateral and positive ETCO2 Secured at: 21 cm Tube secured with: Tape Dental Injury: Teeth and Oropharynx as per pre-operative assessment  Difficulty Due To: Difficulty was anticipated, Difficult Airway- due to dentition and Difficult Airway- due to limited oral opening Future Recommendations: Recommend- induction with short-acting agent, and alternative techniques readily available Comments: Pt with history of difficult intubation. Pt with small opening, prominent dentition, Attempt to intubate using #3Mac blade unsuccessful/ R.F.,with #35 EBTT. Mask ventilation easily. Direct Laryngoscopy with #2Miller blade per Dr. Lorna Few, and Pt easily intubated using Bougie #15 Fr introducer with cricoid pressure times one attempt.BBSE check SaO2 100% throughout induction.   Date/Time: 04/25/2012 9:10 AM Performed by: Jacquiline Doe A Pre-anesthesia Checklist: Patient identified, Timeout performed, Emergency Drugs available, Suction available and Patient being monitored Oxygen Delivery Method: Circle system utilized Preoxygenation: Pre-oxygenation with 100% oxygen Intubation Type: Inhalational induction with existing ETT Endobronchial tube: Bronchial  Blocker placed under direct vision Tube size: 7.5 mm Number of attempts: 1 Placement Confirmation: breath sounds checked- equal and bilateral and positive ETCO2 Tube secured with: Tape Dental Injury: Teeth and Oropharynx as per pre-operative assessment  Comments: Pt with pre-existing 7.5 OETT #7Fr EZ-Blocker kit utilized per Dr. Orene Desanctis ,and lungs isolated with placement. Pt to one-lung ventilation. BBSE checked.

## 2012-04-25 NOTE — Progress Notes (Signed)
UR Completed. Llana Aliment, RN, Nurse Case Manager [phone]

## 2012-04-26 ENCOUNTER — Inpatient Hospital Stay (HOSPITAL_COMMUNITY): Payer: Medicare Other

## 2012-04-26 LAB — BASIC METABOLIC PANEL
CO2: 24 mEq/L (ref 19–32)
Calcium: 8.1 mg/dL — ABNORMAL LOW (ref 8.4–10.5)
Creatinine, Ser: 0.92 mg/dL (ref 0.50–1.10)
GFR calc Af Amer: 74 mL/min — ABNORMAL LOW (ref >90)
GFR calc non Af Amer: 64 mL/min — ABNORMAL LOW (ref >90)
Sodium: 133 mEq/L — ABNORMAL LOW (ref 135–145)

## 2012-04-26 LAB — CBC
HCT: 32.9 % — ABNORMAL LOW (ref 36.0–46.0)
Hemoglobin: 11.1 g/dL — ABNORMAL LOW (ref 12.0–15.0)
RDW: 13.8 % (ref 11.5–15.5)
WBC: 10.4 10*3/uL (ref 4.0–10.5)

## 2012-04-26 LAB — BLOOD GAS, ARTERIAL
Drawn by: 362741
FIO2: 0.21 %
Patient temperature: 98.6
TCO2: 23.5 mmol/L (ref 0–100)
pCO2 arterial: 40.2 mmHg (ref 35.0–45.0)
pH, Arterial: 7.362 (ref 7.350–7.450)

## 2012-04-26 MED ORDER — KETOROLAC TROMETHAMINE 30 MG/ML IJ SOLN
30.0000 mg | Freq: Four times a day (QID) | INTRAMUSCULAR | Status: DC
  Administered 2012-04-26 – 2012-04-27 (×3): 30 mg via INTRAVENOUS
  Filled 2012-04-26 (×7): qty 1

## 2012-04-26 MED ORDER — KETOROLAC TROMETHAMINE 30 MG/ML IJ SOLN
30.0000 mg | Freq: Four times a day (QID) | INTRAMUSCULAR | Status: DC
  Administered 2012-04-26: 30 mg via INTRAVENOUS
  Filled 2012-04-26: qty 1

## 2012-04-26 MED ORDER — WHITE PETROLATUM GEL
Status: AC
  Administered 2012-04-26: 12:00:00
  Filled 2012-04-26: qty 5

## 2012-04-26 MED ORDER — ALBUTEROL SULFATE HFA 108 (90 BASE) MCG/ACT IN AERS
4.0000 | INHALATION_SPRAY | Freq: Four times a day (QID) | RESPIRATORY_TRACT | Status: DC | PRN
  Filled 2012-04-26: qty 6.7

## 2012-04-26 NOTE — Anesthesia Postprocedure Evaluation (Signed)
  Anesthesia Post-op Note  Patient: Desiree Adams  Procedure(s) Performed: Procedure(s) (LRB): VIDEO ASSISTED THORACOSCOPY (VATS)/ LOBECTOMY (Right)  Patient Location: ICU  Anesthesia Type: General  Level of Consciousness: awake, alert  and oriented  Airway and Oxygen Therapy: Patient Spontanous Breathing  Post-op Pain: none  Post-op Assessment: Post-op Vital signs reviewed, Patient's Cardiovascular Status Stable, Respiratory Function Stable, Patent Airway and No signs of Nausea or vomiting  Post-op Vital Signs: Reviewed  Complications: No apparent anesthesia complications

## 2012-04-26 NOTE — Progress Notes (Signed)
1 Day Post-Op Procedure(s) (LRB): VIDEO ASSISTED THORACOSCOPY (VATS)/ LOBECTOMY (Right) Subjective: C/o incisional pain and nausea earlier this AM, feels better now No trouble breathing  Objective: Vital signs in last 24 hours: Temp:  [97 F (36.1 C)-98 F (36.7 C)] 97.2 F (36.2 C) (07/26 0740) Pulse Rate:  [59-87] 79  (07/26 0740) Cardiac Rhythm:  [-] Normal sinus rhythm (07/26 0405) Resp:  [10-22] 18  (07/26 0752) BP: (101-123)/(49-65) 108/59 mmHg (07/26 0740) SpO2:  [96 %-100 %] 99 % (07/26 0752) Arterial Line BP: (96-141)/(58-89) 105/89 mmHg (07/26 0500) FiO2 (%):  [2 %] 2 % (07/25 1952) Weight:  [121 lb 11.2 oz (55.203 kg)] 121 lb 11.2 oz (55.203 kg) (07/25 2300)  Hemodynamic parameters for last 24 hours:    Intake/Output from previous day: 07/25 0701 - 07/26 0700 In: 5663.3 [P.O.:880; I.V.:4533.3; IV Piggyback:250] Out: 2350 [Urine:1600; Blood:250; Chest Tube:500] Intake/Output this shift:    General appearance: alert and no distress Neurologic: intact Heart: regular rate and rhythm Lungs: diminished breath sounds base - right No air leak   Lab Results:  Basename 04/26/12 0420 04/24/12 1346  WBC 10.4 6.9  HGB 11.1* 12.6  HCT 32.9* 38.0  PLT 271 322   BMET:  Basename 04/26/12 0420 04/24/12 1346  NA 133* 143  K 4.5 3.5  CL 98 105  CO2 24 28  GLUCOSE 113* 90  BUN 14 17  CREATININE 0.92 1.49*  CALCIUM 8.1* 9.0    PT/INR:  Basename 04/24/12 1346  LABPROT 13.0  INR 0.96   ABG    Component Value Date/Time   PHART 7.362 04/26/2012 0530   HCO3 22.2 04/26/2012 0530   TCO2 23.5 04/26/2012 0530   ACIDBASEDEF 2.4* 04/26/2012 0530   O2SAT 97.1 04/26/2012 0530   CBG (last 3)  No results found for this basename: GLUCAP:3 in the last 72 hours  CXR- postop changes  Assessment/Plan: S/P Procedure(s) (LRB): VIDEO ASSISTED THORACOSCOPY (VATS)/ LOBECTOMY (Right) POD # 1 R VATS Lower Lobectomy No air leak- will d/c anterior CT Pain control- PCA, On Q, will  add toradol x 48 hours Ambulate Advance diet,IV to KVO   LOS: 1 day    Kyrollos Cordell C 04/26/2012

## 2012-04-26 NOTE — Progress Notes (Signed)
Dr. Roxy Manns called @1845 --pt having deep, mid-sternal pressure and has vomited once. Order for stat EKG and to call him back if EKG is abnormal--unconfirmed EKG reads as normal. Armandina Gemma, Kirby Crigler

## 2012-04-27 ENCOUNTER — Inpatient Hospital Stay (HOSPITAL_COMMUNITY): Payer: Medicare Other

## 2012-04-27 LAB — CBC
HCT: 32.6 % — ABNORMAL LOW (ref 36.0–46.0)
MCH: 30.9 pg (ref 26.0–34.0)
MCV: 89.8 fL (ref 78.0–100.0)
Platelets: 254 10*3/uL (ref 150–400)
RBC: 3.63 MIL/uL — ABNORMAL LOW (ref 3.87–5.11)
RDW: 13.9 % (ref 11.5–15.5)

## 2012-04-27 LAB — COMPREHENSIVE METABOLIC PANEL
AST: 44 U/L — ABNORMAL HIGH (ref 0–37)
BUN: 17 mg/dL (ref 6–23)
CO2: 29 mEq/L (ref 19–32)
Calcium: 8.4 mg/dL (ref 8.4–10.5)
Creatinine, Ser: 1 mg/dL (ref 0.50–1.10)
GFR calc non Af Amer: 58 mL/min — ABNORMAL LOW (ref >90)

## 2012-04-27 MED ORDER — KETOROLAC TROMETHAMINE 30 MG/ML IJ SOLN
15.0000 mg | Freq: Four times a day (QID) | INTRAMUSCULAR | Status: AC
  Administered 2012-04-27 – 2012-04-28 (×4): 15 mg via INTRAVENOUS
  Filled 2012-04-27 (×3): qty 1

## 2012-04-27 NOTE — Progress Notes (Addendum)
[address]            [place] [address]          [phone]     2 Days Post-Op  Procedure(s) (LRB): VIDEO ASSISTED THORACOSCOPY (VATS)/ LOBECTOMY (Right) Subjective: Says she feels much better. Feels stronger, no significant SOB  Objective  Telemetry sinus rhythm   Temp:  [98.2 F (36.8 C)-100.4 F (38 C)] 98.2 F (36.8 C) (07/27 0724) Pulse Rate:  [75-119] 83  (07/27 0725) Resp:  [13-25] 13  (07/27 0727) BP: (96-128)/(50-68) 100/58 mmHg (07/27 0725) SpO2:  [92 %-100 %] 97 % (07/27 0727)   Intake/Output Summary (Last 24 hours) at 04/27/12 1001 Last data filed at 04/27/12 0856  Gross per 24 hour  Intake   1280 ml  Output   2735 ml  Net  -1455 ml       General appearance: alert, cooperative and no distress Heart: regular rate and rhythm and S1, S2 normal Lungs: dim in Right base Abdomen: benign Extremities: no edema Wound: incis healing well  Lab Results:  Basename 04/27/12 0451 04/26/12 0420  NA 136 133*  K 4.0 4.5  CL 101 98  CO2 29 24  GLUCOSE 110* 113*  BUN 17 14  CREATININE 1.00 0.92  CALCIUM 8.4 8.1*  MG -- --  PHOS -- --    Basename 04/27/12 0451 04/24/12 1346  AST 44* 22  ALT 92* 47*  ALKPHOS 106 88  BILITOT 0.5 0.4  PROT 5.8* 6.9  ALBUMIN 2.5* 3.4*   No results found for this basename: LIPASE:2,AMYLASE:2 in the last 72 hours  Basename 04/27/12 0451 04/26/12 0420  WBC 10.8* 10.4  NEUTROABS -- --  HGB 11.2* 11.1*  HCT 32.6* 32.9*  MCV 89.8 89.9  PLT 254 271   No results found for this basename: CKTOTAL:4,CKMB:4,TROPONINI:4 in the last 72 hours No components found with this basename: POCBNP:3 No results found for this basename: DDIMER in the last 72 hours No results found for this basename: HGBA1C in the last 72 hours No results found for this basename: CHOL,HDL,LDLCALC,TRIG,CHOLHDL in the last 72 hours No results found for this basename: TSH,T4TOTAL,FREET3,T3FREE,THYROIDAB in the last 72  hours No results found for this basename: VITAMINB12,FOLATE,FERRITIN,TIBC,IRON,RETICCTPCT in the last 72 hours  Medications: Scheduled    . acetaminophen  1,000 mg Intravenous Q6H  . bisacodyl  10 mg Oral Daily  . fentaNYL   Intravenous Q4H  . ketorolac  30 mg Intravenous Q6H  . loperamide  2 mg Oral BID  . multivitamin with minerals  1 tablet Oral Daily  . sodium chloride  10-40 mL Intracatheter Q12H  . white petrolatum      . DISCONTD: ketorolac  30 mg Intravenous Q6H     Radiology/Studies:  Dg Chest Port 1 View  04/26/2012  *RADIOLOGY REPORT*  Clinical Data: Status post right-sided VATS.  PORTABLE CHEST - 1 VIEW  Comparison: Chest x-ray 04/25/2012.  Findings: Two right-sided chest tubes remain in position.  There may be a trace residual right apical pneumothorax, however, if present this is smaller than yesterday's examination.  Elevation of the right hemidiaphragm.  Postoperative changes of a right lower lobectomy.  Minimal linear opacities in the left lower lobe likely reflects subsegmental atelectasis.  No significant pleural effusions.  No evidence of pulmonary edema.  Heart size is normal. Slight shift of cardiomediastinal structures to the right. There  is a right-sided subclavian central venous catheter with tip terminating in the mid superior vena cava.  IMPRESSION: 1.  Postoperative changes and support apparatus, as above. 2.  Minimal left lower lobe subsegmental atelectasis. 3.  Possible trace right apical pneumothorax.  If present, this is slightly smaller than the yesterday's examination.  Original Report Authenticated By: Etheleen Mayhew, M.D.   Dg Chest Portable 1 View  04/25/2012  *RADIOLOGY REPORT*  Clinical Data: Postoperative evaluation  PORTABLE CHEST - 1 VIEW  Comparison: 04/24/2012  Findings: A right subclavian CVP is in place with the tip located in the region of the superior cavoatrial junction.  Heart and mediastinal contours are stable.  The right lower lobe  nodule has been surgically removed and two right-sided chest tubes have been placed.  A trace right apical pneumothorax is noted.  A small amount of right pleural fluid is seen.  The lung fields are otherwise clear.  Bilateral breast implants demonstrate peripheral calcification  IMPRESSION: Trace right apical pneumothorax and a small amount of right pleural fluid postoperatively.  Postoperative support apparatus as noted above.  No new worrisome pulmonary findings  Original Report Authenticated By: Ander Gaster, M.D.    Chest tube- + air leak with cough, 210 cc out 24 hrs    INR: Will add last result for INR, ABG once components are confirmed Will add last 4 CBG results once components are confirmed  Assessment/Plan: S/P Procedure(s) (LRB): VIDEO ASSISTED THORACOSCOPY (VATS)/ LOBECTOMY (Right)  1 stable- keep chest tube for now on H2O seal , recheck CXR in am 2 push pulm toilet/rehab 3 labs stable, LFT's slightly elevated- no further IV tylenol  LOS: 2 days    GOLD,WAYNE E 7/27/201310:01 AM    I have seen and examined the patient and agree with the assessment and plan as outlined.  Meeyah Ovitt H 04/27/2012 10:59 AM

## 2012-04-27 NOTE — Progress Notes (Signed)
Notified Desiree Pierini PA that pt has developed a significant air leak--all tubing is connected and secure. Tube assessed at site-- stiches intact and no opening in tube showing. Covered site with extra vaseline gauze but no change in size of air leak. Plan of care is to get a stat cxr. Desiree Adams

## 2012-04-28 ENCOUNTER — Inpatient Hospital Stay (HOSPITAL_COMMUNITY): Payer: Medicare Other

## 2012-04-28 NOTE — Progress Notes (Signed)
[address]            [place] [address]          [phone]     3 Days Post-Op  Procedure(s) (LRB): VIDEO ASSISTED THORACOSCOPY (VATS)/ LOBECTOMY (Right) Subjective: Feels ok  Objective  Telemetry sinus rhythm  Temp:  [98 F (36.7 C)-99 F (37.2 C)] 98.4 F (36.9 C) (07/28 0746) Pulse Rate:  [83-95] 83  (07/28 0745) Resp:  [15-22] 16  (07/28 0755) BP: (102-126)/(57-67) 110/64 mmHg (07/28 0745) SpO2:  [96 %-99 %] 99 % (07/28 0755)   Intake/Output Summary (Last 24 hours) at 04/28/12 1019 Last data filed at 04/28/12 0900  Gross per 24 hour  Intake   1130 ml  Output    610 ml  Net    520 ml       General appearance: alert, cooperative and no distress Heart: regular rate and rhythm and S1, S2 normal Lungs: dim right base Abdomen: benign Extremities: no edema Wound: incis ok  Lab Results:  Basename 04/27/12 0451 04/26/12 0420  NA 136 133*  K 4.0 4.5  CL 101 98  CO2 29 24  GLUCOSE 110* 113*  BUN 17 14  CREATININE 1.00 0.92  CALCIUM 8.4 8.1*  MG -- --  PHOS -- --    Basename 04/27/12 0451  AST 44*  ALT 92*  ALKPHOS 106  BILITOT 0.5  PROT 5.8*  ALBUMIN 2.5*   No results found for this basename: LIPASE:2,AMYLASE:2 in the last 72 hours  Basename 04/27/12 0451 04/26/12 0420  WBC 10.8* 10.4  NEUTROABS -- --  HGB 11.2* 11.1*  HCT 32.6* 32.9*  MCV 89.8 89.9  PLT 254 271   No results found for this basename: CKTOTAL:4,CKMB:4,TROPONINI:4 in the last 72 hours No components found with this basename: POCBNP:3 No results found for this basename: DDIMER in the last 72 hours No results found for this basename: HGBA1C in the last 72 hours No results found for this basename: CHOL,HDL,LDLCALC,TRIG,CHOLHDL in the last 72 hours No results found for this basename: TSH,T4TOTAL,FREET3,T3FREE,THYROIDAB in the last 72 hours No results found for this basename: VITAMINB12,FOLATE,FERRITIN,TIBC,IRON,RETICCTPCT in the last 72  hours  Medications: Scheduled    . fentaNYL   Intravenous Q4H  . ketorolac  15 mg Intravenous Q6H  . loperamide  2 mg Oral BID  . multivitamin with minerals  1 tablet Oral Daily  . sodium chloride  10-40 mL Intracatheter Q12H  . DISCONTD: ketorolac  30 mg Intravenous Q6H     Radiology/Studies:  Dg Chest 2 View  04/28/2012  *RADIOLOGY REPORT*  Clinical Data: 66 year old female with right thoracotomy.  Right pneumothorax.  CHEST - 2 VIEW  Comparison: 04/27/2012 and prior chest radiographs  Findings: A right thoracostomy tube is again identified. A small right apical pneumothorax has minimally increased, now 5- 10%. A right subclavian central venous catheter is again noted with tip overlying the mid SVC. Right thoracic surgical changes are again noted. Minimal left basilar atelectasis is present. No other changes are identified.  IMPRESSION: Minimal increase in small right apical pneumothorax, now 5-10%.  Unchanged right thoracostomy tube and right central venous catheter.  No other significant changes identified.  Original Report Authenticated By: Lura Em, M.D.   Dg Chest Port 1 View  04/27/2012  *RADIOLOGY REPORT*  Clinical Data: 66 year old female - increasing air leak following right thoracotomy on 04/25/2012.  PORTABLE CHEST - 1 VIEW  Comparison: 04/26/2012 and prior chest radiographs  Findings: Right postoperative changes are again identified. One of the right thoracostomy tubes has been removed.  The other right thoracostomy tube with tip near the apex is unchanged. A small (5%) right apical pneumothorax is again identified.  A right subclavian Port-A-Cath is present with tip overlying the mid - lower SVC. Mild right basilar atelectasis is noted. There is no evidence of left pneumothorax.  IMPRESSION: One of the right thoracostomy tubes has been removed with the other right thoracostomy tube unchanged.  Stable small right apical pneumothorax.  No other significant changes.  Original  Report Authenticated By: Lura Em, M.D.    Chest tube + air leak, min drainage in tube, but allot on dressings requiring frequent change      INR: Will add last result for INR, ABG once components are confirmed Will add last 4 CBG results once components are confirmed  Assessment/Plan: S/P Procedure(s) (LRB): VIDEO ASSISTED THORACOSCOPY (VATS)/ LOBECTOMY (Right)  1. Stable but will try back on suction 2 d/c pca/central line, on q    LOS: 3 days    GOLD,WAYNE E 7/28/201310:19 AM

## 2012-04-28 NOTE — Progress Notes (Signed)
PCA Fentanyl 20 ml wasted in sink. Witnessed by Eustace Moore, RN. Armandina Gemma, Kirby Crigler

## 2012-04-29 ENCOUNTER — Inpatient Hospital Stay (HOSPITAL_COMMUNITY): Payer: Medicare Other

## 2012-04-29 MED ORDER — OXYCODONE-ACETAMINOPHEN 5-325 MG PO TABS
1.0000 | ORAL_TABLET | ORAL | Status: AC | PRN
Start: 2012-04-29 — End: 2012-05-09

## 2012-04-29 MED ORDER — DOCUSATE SODIUM 100 MG PO CAPS
100.0000 mg | ORAL_CAPSULE | Freq: Two times a day (BID) | ORAL | Status: DC
  Administered 2012-04-29: 100 mg via ORAL
  Filled 2012-04-29: qty 1

## 2012-04-29 MED ORDER — DOCUSATE SODIUM 100 MG PO CAPS
100.0000 mg | ORAL_CAPSULE | Freq: Every day | ORAL | Status: DC | PRN
  Administered 2012-04-29: 100 mg via ORAL

## 2012-04-29 NOTE — Progress Notes (Signed)
No complaints  On water seal, there is fluctuation of water column but no air leak  D/c CT

## 2012-04-29 NOTE — Progress Notes (Signed)
Chest tube removed per order. Pt tolerated well. Desiree Adams E

## 2012-04-29 NOTE — Progress Notes (Signed)
4 Days Post-Op Procedure(s) (LRB): VIDEO ASSISTED THORACOSCOPY (VATS)/ LOBECTOMY (Right) Subjective: Feels well. Some incisional pain but responds to meds  Objective: Vital signs in last 24 hours: Temp:  [97.5 F (36.4 C)-98.8 F (37.1 C)] 97.5 F (36.4 C) (07/29 0400) Pulse Rate:  [72-89] 72  (07/29 0400) Cardiac Rhythm:  [-] Normal sinus rhythm (07/29 0400) Resp:  [16-22] 17  (07/29 0400) BP: (110-127)/(54-70) 116/60 mmHg (07/29 0400) SpO2:  [96 %-100 %] 97 % (07/29 0400)  Hemodynamic parameters for last 24 hours:    Intake/Output from previous day: 07/28 0701 - 07/29 0700 In: 1400 [P.O.:1320; I.V.:80] Out: 430 [Urine:300; Chest Tube:130] Intake/Output this shift:    General appearance: alert and no distress Heart: regular rate and rhythm Lungs: clear to auscultation bilaterally  Lab Results:  Basename 04/27/12 0451  WBC 10.8*  HGB 11.2*  HCT 32.6*  PLT 254   BMET:  Basename 04/27/12 0451  NA 136  K 4.0  CL 101  CO2 29  GLUCOSE 110*  BUN 17  CREATININE 1.00  CALCIUM 8.4    PT/INR: No results found for this basename: LABPROT,INR in the last 72 hours ABG    Component Value Date/Time   PHART 7.362 04/26/2012 0530   HCO3 22.2 04/26/2012 0530   TCO2 23.5 04/26/2012 0530   ACIDBASEDEF 2.4* 04/26/2012 0530   O2SAT 97.1 04/26/2012 0530   CBG (last 3)  No results found for this basename: GLUCAP:3 in the last 72 hours  Assessment/Plan: S/P Procedure(s) (LRB): VIDEO ASSISTED THORACOSCOPY (VATS)/ LOBECTOMY (Right) POD # 4 No air leak this AM CT to water seal, CXR at noon, d/c CT if CXR oK Ambulate No diarrhea   LOS: 4 days    Sion Reinders C 04/29/2012

## 2012-04-30 ENCOUNTER — Inpatient Hospital Stay (HOSPITAL_COMMUNITY): Payer: Medicare Other

## 2012-04-30 MED ORDER — FLEET ENEMA 7-19 GM/118ML RE ENEM: 1.0000 | ENEMA | Freq: Every day | RECTAL | Status: DC | PRN

## 2012-04-30 MED ORDER — BISACODYL 10 MG RE SUPP
10.0000 mg | Freq: Every day | RECTAL | Status: DC | PRN
  Administered 2012-04-30: 10 mg via RECTAL
  Filled 2012-04-30: qty 1

## 2012-04-30 MED ORDER — MAGNESIUM HYDROXIDE 400 MG/5ML PO SUSP
30.0000 mL | Freq: Every day | ORAL | Status: DC | PRN
  Administered 2012-04-30: 30 mL via ORAL
  Filled 2012-04-30: qty 30

## 2012-04-30 NOTE — Discharge Summary (Signed)
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 Patient informed by telephone.

## 2012-04-30 NOTE — Progress Notes (Signed)
Discharged home with friends via w/c with belongings. D/c instructions, f/u appt. Prescriptions care notes on thoracotomy given to pt and explained to pt by using teach back. Desiree Adams

## 2012-04-30 NOTE — Progress Notes (Signed)
5 Days Post-Op Procedure(s) (LRB): VIDEO ASSISTED THORACOSCOPY (VATS)/ LOBECTOMY (Right) Subjective: Feels well, no problems breathing  Objective: Vital signs in last 24 hours: Temp:  [97.5 F (36.4 C)-99 F (37.2 C)] 98.1 F (36.7 C) (07/30 0734) Pulse Rate:  [63-93] 93  (07/30 0734) Cardiac Rhythm:  [-] Normal sinus rhythm (07/30 0747) Resp:  [0-22] 22  (07/30 0734) BP: (110-136)/(57-72) 128/61 mmHg (07/30 0734) SpO2:  [93 %-97 %] 97 % (07/30 0734)  Hemodynamic parameters for last 24 hours:    Intake/Output from previous day: 07/29 0701 - 07/30 0700 In: 850 [P.O.:840; IV Piggyback:10] Out: 320 [Urine:240; Chest Tube:80] Intake/Output this shift:    General appearance: alert and no distress Heart: regular rate and rhythm Lungs: diminished breath sounds base - right Wound: intact, some serous drainage from CT site  Lab Results: No results found for this basename: WBC:2,HGB:2,HCT:2,PLT:2 in the last 72 hours BMET: No results found for this basename: NA:2,K:2,CL:2,CO2:2,GLUCOSE:2,BUN:2,CREATININE:2,CALCIUM:2 in the last 72 hours  PT/INR: No results found for this basename: LABPROT,INR in the last 72 hours ABG    Component Value Date/Time   PHART 7.362 04/26/2012 0530   HCO3 22.2 04/26/2012 0530   TCO2 23.5 04/26/2012 0530   ACIDBASEDEF 2.4* 04/26/2012 0530   O2SAT 97.1 04/26/2012 0530   CBG (last 3)  No results found for this basename: GLUCAP:3 in the last 72 hours  Assessment/Plan: S/P Procedure(s) (LRB): VIDEO ASSISTED THORACOSCOPY (VATS)/ LOBECTOMY (Right) Plan for discharge: see discharge orders Path still pending CXR shows space filling with fluid, no pneumothorax D/c home today   LOS: 5 days    Desiree Adams C 04/30/2012

## 2012-04-30 NOTE — Discharge Summary (Addendum)
[address]            [place] [address]          [phone]         Discharge Summary  Name: Desiree Adams DOB: 09/09/1946 66 y.o. MRN: [id number]   Admission Date: 04/25/2012 Discharge Date:     Admitting Diagnosis: Right lower lobe lung mass    Discharge Diagnosis:  Right lower lobe lung mass, low grade neuroendocrine tumor (carcinoid tumor)   Past Medical History  Diagnosis Date  . Ovarian cancer   . Small bowel obstruction   . 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 History of Clostridium difficile infection   . Vitamin B12 deficiency   . Biliary colic   . PONV (postoperative nausea and vomiting)     always nausea and  vomiting  . Difficult intubation   . Right lower lobe lung mass   . Asthma     as a "baby"  . Urinary tract infection       Procedures: Procedure(s): RIGHT VIDEO ASSISTED THORACOSCOPY/ MINI-THORACTOMY, RIGHT LOWER LOBECTOMY on 04/25/2012    HPI:  The patient is a 66 y.o. female with a history of ovarian cancer at age 42. This was treated with surgical resection and radiation. 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 Jackolyn Confer. Her preoperative evaluation a chest x-ray showed a right lung mass. On CT scan this was a 3.3 cm diameter smooth, round mass in the right lower lobe. A PET CT was done which showed this lesion to be hypermetabolic with an SUV of 6.6. Of note, she had been found to have a right lower lobe mass on CT back in 2001. She was evaluated for this at [facility], and no surgery or followup was recommended. The CT from 2001 is not available for review, but the report indicates that this was a 2 cm round mass. She was referred to Dr. Modesto Charon for thoracic surgery evaluation.  He agreed with the need for surgical  resection of the mass at this time.  All risks, benefits and alternatives of surgery were explained in detail, and the patient agreed to proceed.     Hospital Course:  The patient was admitted to [facility] on 7/25/2013The patient was taken to the operating room and underwent the above procedure.    The postoperative course has been generally uneventful. Her chest tubes have been removed in the standard fashion, and followup chest x-rays have shown no pneumothorax.  She has remained afebrile, and vital signs are stable.  She is tolerating a regular diet, and is ambulating in the halls without difficulty. Her final surgical pathology is not available at the time of this dictation, but the intra-operative frozen section indicates a probable low grade carcinoma, possibly carcinoid.  She has been evaluated on today's date and is ready for discharge home.    Recent vital signs:  Filed Vitals:   04/30/12 0734  BP: 128/61  Pulse: 93  Temp: 98.1 F (36.7 C)  Resp: 22    Recent laboratory studies:  CBC:No results found for this basename: WBC:2,HGB:2,HCT:2,PLT:2 in the last 72 hours BMET: No results found for this basename: NA:2,K:2,CL:2,CO2:2,GLUCOSE:2,BUN:2,CREATININE:2,CALCIUM:2 in the last 72 hours  PT/INR: No  results found for this basename: LABPROT,INR in the last 72 hours   Discharge Medications:   Medication List  As of 04/30/2012  8:25 AM   TAKE these medications         cyanocobalamin 1000 MCG/ML injection   Commonly known as: (VITAMIN B-12)   Inject 1 ml or 1000 mcg IM once a week for 3 weeks and then once a month for 5 months      loperamide 2 MG capsule   Commonly known as: IMODIUM   Take 2 mg by mouth 2 (two) times daily.      multivitamin tablet   Take 1 tablet by mouth daily.      oxyCODONE-acetaminophen 5-325 MG per tablet   Commonly known as: PERCOCET/ROXICET   Take 1-2 tablets by mouth every 4 (four) hours as needed.      paregoric 2 MG/5ML solution   Take 5 mLs  by mouth 4 (four) times daily as needed. For diherrah             Discharge Instructions:  The patient is to refrain from driving, heavy lifting or strenuous activity.  May shower daily and clean incisions with soap and water.  May resume regular diet.   Follow Up:    Follow-up Information    Follow up with HENDRICKSON,STEVEN C, MD. (2 weeks-office will contact you. Please obtain a chest x-ray at [facility] 1 hour prior to appointment with Dr. Roxan Hockey. 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

## 2012-05-13 ENCOUNTER — Telehealth: Payer: Self-pay

## 2012-05-13 DIAGNOSIS — G8918 Other acute postprocedural pain: Secondary | ICD-10-CM

## 2012-05-13 MED ORDER — TRAMADOL HCL 50 MG PO TABS: 50.0000 mg | ORAL_TABLET | Freq: Four times a day (QID) | ORAL | Status: DC | PRN

## 2012-05-13 NOTE — Telephone Encounter (Signed)
RX for Tramadol 50 mg 1 po every 6 hours prn pain #41/ no refill call to [facility] in [place]

## 2012-05-16 ENCOUNTER — Other Ambulatory Visit: Payer: Self-pay | Admitting: Thoracic Surgery (Cardiothoracic Vascular Surgery)

## 2012-05-16 DIAGNOSIS — D381 Neoplasm of uncertain behavior of trachea, bronchus and lung: Secondary | ICD-10-CM

## 2012-05-16 NOTE — Telephone Encounter (Signed)
See previous note from 04-24-12

## 2012-05-21 ENCOUNTER — Ambulatory Visit
Admission: RE | Admit: 2012-05-21 | Discharge: 2012-05-21 | Disposition: A | Discharge status: Home / Self Care | Payer: Medicare Other | Source: Ambulatory Visit | Attending: Thoracic Surgery (Cardiothoracic Vascular Surgery) | Admitting: Thoracic Surgery (Cardiothoracic Vascular Surgery)

## 2012-05-21 ENCOUNTER — Ambulatory Visit (INDEPENDENT_AMBULATORY_CARE_PROVIDER_SITE_OTHER): Payer: Self-pay | Admitting: Thoracic Surgery (Cardiothoracic Vascular Surgery)

## 2012-05-21 ENCOUNTER — Encounter: Payer: Self-pay | Admitting: Thoracic Surgery (Cardiothoracic Vascular Surgery)

## 2012-05-21 VITALS — BP 110/69 | HR 120 | Resp 18 | Ht 64.0 in | Wt 118.0 lb

## 2012-05-21 DIAGNOSIS — D3A00 Benign carcinoid tumor of unspecified site: Secondary | ICD-10-CM

## 2012-05-21 DIAGNOSIS — Z902 Acquired absence of lung [part of]: Secondary | ICD-10-CM

## 2012-05-21 DIAGNOSIS — D3A090 Benign carcinoid tumor of the bronchus and lung: Secondary | ICD-10-CM

## 2012-05-21 DIAGNOSIS — Z9889 Other specified postprocedural states: Secondary | ICD-10-CM

## 2012-05-21 DIAGNOSIS — Z09 Encounter for follow-up examination after completed treatment for conditions other than malignant neoplasm: Secondary | ICD-10-CM

## 2012-05-21 DIAGNOSIS — D381 Neoplasm of uncertain behavior of trachea, bronchus and lung: Secondary | ICD-10-CM

## 2012-05-21 NOTE — Progress Notes (Signed)
  HPI:  Desiree Adams returns today for a scheduled postoperative followup visit. She had a 3 cm right lower lobe mass. This is hypermetabolic by PET. She underwent a right lower lobectomy on 04/25/2012. The mass turned out to be a low-grade neuroendocrine neoplasm (carcinoid tumor). Postoperatively she did well without any major complications. She says that she still has some discomfort. She says she usually wakes up about 3 or 4 in the morning with an aching discomfort for which she is using extra strength Tylenol. She was unable to take narcotics gastrointestinal side effects. During the daytime her pain is relatively mild.  Past Medical History  Diagnosis Date  . Ovarian cancer   . Small bowel obstruction   . 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 History of Clostridium difficile infection   . Vitamin B12 deficiency   . Biliary colic   . PONV (postoperative nausea and vomiting)     always nausea and  vomiting  . Difficult intubation   . Right lower lobe lung mass   . Asthma     as a "baby"  . Urinary tract infection      Current Outpatient Prescriptions  Medication Sig Dispense Refill  . cyanocobalamin (,VITAMIN B-12,) 1000 MCG/ML injection Inject 1 ml or 1000 mcg IM once a week for 3 weeks and then once a month for 5 months  3 mL  5  . loperamide (IMODIUM) 2 MG capsule Take 2 mg by mouth 2 (two) times daily.      . Multiple Vitamin (MULTIVITAMIN) tablet Take 1 tablet by mouth daily.        Physical Exam BP 110/69  Pulse 120  Resp 18  Ht 5\' 4"  (1.626 m)  Wt 118 lb (53.524 kg)  BMI 20.25 kg/m2  SpO2 100% Gen. 66 year old woman in no acute distress Lungs diminished breath sounds right base otherwise clear Wounds healing well  Diagnostic Tests: Chest x-ray today *RADIOLOGY REPORT*  Clinical Data: History of resection of right lower lobe lung  carcinoid tumor  CHEST - 2 VIEW  Comparison:  Chest x-ray of 04/30/2012  Findings: There is slightly improved aeration in the lungs.  Probable loculated right basilar pleural effusion is stable. The  lungs remain hyperaerated. Heart size is stable. No bony  abnormality is seen.  IMPRESSION:  No change in probable loculated right pleural effusion.  Impression: 66 year old woman now about a month out from a right VATS and right lower lobectomy for a carcinoid tumor. 7 lymph nodes were taken and none were involved. I think given the time course of this process that this is likely a benign carcinoid and she is at extremely low risk for recurrence. However the lesion was hypermetabolic on PET CT, so do think she needs to be followed for a while to rule out recurrence. See her back in a year with a CT of the chest.  I think if she is medically ready for her abdominal surgery and she can proceed with it whenever she feels up to it.  At this point her activities are unrestricted I did advise her to build and gradually. She has already been driving and has been following the precautions as instructed.  Plan: May proceed with abdominal surgery  I will see her back in one year with a CT of the chest

## 2012-05-21 NOTE — Assessment & Plan Note (Signed)
S/p right lower lobectomy for 3 cm carcinoid tumor 04/25/12

## 2012-06-06 ENCOUNTER — Ambulatory Visit (INDEPENDENT_AMBULATORY_CARE_PROVIDER_SITE_OTHER): Payer: Medicare Other | Admitting: General Surgery

## 2012-06-06 ENCOUNTER — Encounter (INDEPENDENT_AMBULATORY_CARE_PROVIDER_SITE_OTHER): Payer: Self-pay | Admitting: General Surgery

## 2012-06-06 VITALS — BP 114/70 | HR 68 | Temp 97.3°F | Resp 16 | Ht 64.0 in | Wt 118.1 lb

## 2012-06-06 DIAGNOSIS — K627 Radiation proctitis: Secondary | ICD-10-CM

## 2012-06-06 DIAGNOSIS — K6289 Other specified diseases of anus and rectum: Secondary | ICD-10-CM

## 2012-06-06 NOTE — Progress Notes (Signed)
Patient ID: Desiree Adams, female   DOB: 1946-08-01, 66 y.o.   MRN: [id number]  Chief Complaint  Patient presents with  . Pre-op Exam    HPI Desiree Adams is a 66 y.o. female.   HPI  She did well with her right lower lobectomy for what turned out to be a carcinoid. She is here to reschedule her abdominal surgery. She is still having 20 some bowel movements a day and has significant pain related to her radiation proctitis.  Past Medical History  Diagnosis Date  . Small bowel obstruction   . 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 History of Clostridium difficile infection   . Vitamin B12 deficiency   . Biliary colic   . PONV (postoperative nausea and vomiting)     always nausea and  vomiting  . Difficult intubation   . Right lower lobe lung mass   . Asthma     as a "baby"  . Urinary tract infection   . Ovarian cancer   . Carcinoid tumor, bronchus, lung, malignant     Past Surgical History  Procedure Date  . Total abdominal hysterectomy w/ bilateral salpingoophorectomy   . Appendectomy   . Laparoscopic cholecystectomy   . Breast enhancement surgery   . Exploratory laparotomy w/ bowel resection   . Colonoscopy 12/25/2011    Procedure: COLONOSCOPY;  Surgeon: Lafayette Dragon, MD;  Location: [facility];  Service: Endoscopy;  Laterality: N/A;  . Lung removal, partial     Family History  Problem Relation Age of Onset  . Kidney cancer      ???? Grandfather  . Colon cancer Neg Hx     Social History History  Substance Use Topics  . Smoking status: Never Smoker   . Smokeless tobacco: Never Used  . Alcohol Use: Yes     very rarely    Allergies  Allergen Reactions  . Sulfamethoxazole     REACTION: unspecified    Current Outpatient Prescriptions  Medication Sig Dispense Refill  . cyanocobalamin (,VITAMIN B-12,) 1000 MCG/ML injection Inject 1 ml or 1000 mcg IM once a week for 3 weeks and  then once a month for 5 months  3 mL  5  . loperamide (IMODIUM) 2 MG capsule Take 2 mg by mouth 2 (two) times daily.      . Multiple Vitamin (MULTIVITAMIN) tablet Take 1 tablet by mouth daily.        Review of Systems Review of Systems  Constitutional: Negative for fever and chills.  Gastrointestinal: Positive for rectal pain.    Blood pressure 114/70, pulse 68, temperature 97.3 F (36.3 C), temperature source Temporal, resp. rate 16, height 5\' 4"  (1.626 m), weight 118 lb 2 oz (53.581 kg).  Physical Exam Physical Exam  Constitutional: She appears well-developed and well-nourished. No distress.  HENT:  Head: Normocephalic and atraumatic.  Cardiovascular: Normal rate and regular rhythm.   Pulmonary/Chest:       Slightly decreased breath sounds in the right base. There are well-healed small right-sided chest wall scars.  Abdominal: Soft. She exhibits no distension. There is no tenderness.       There is a long midline scar and a lower transverse scar present. There are small upper abdominal scars present.    Data Reviewed Some of the notes in EPIC  Assessment    Persistent radiation proctitis.    Plan    Laparoscopic possible open intestinal resection with  ileostomy or colostomy. The procedure and risks have been discussed with her previously.       Jozeph Persing J 06/06/2012, 3:29 PM

## 2012-06-06 NOTE — Patient Instructions (Signed)
We will schedule your surgery today.

## 2012-06-13 ENCOUNTER — Telehealth: Payer: Self-pay | Admitting: Internal Medicine

## 2012-06-13 NOTE — Telephone Encounter (Signed)
Patient very upset about scheduling of her surgery with Dr. Zella Richer. States she talked with Dr. Olevia Perches last week and she thought her surgery would be 06/27/12. She received a message to call [facility] this AM and she called them. They told her that 9/26 was no longer available and that they had been leaving her messages to call them. She states they did not leave any messages until today. They told her the earliest they could do her surgery in 07/11/12. She states she cannot wait until then. She wants to get another Psychologist, sport and exercise. States she has heard good things about [employer] [employer]. She would like Dr. Olevia Perches to call her.

## 2012-06-14 ENCOUNTER — Encounter (HOSPITAL_COMMUNITY): Payer: Self-pay | Admitting: Pharmacy Technician

## 2012-06-14 NOTE — Telephone Encounter (Signed)
It is between her and Dr Zella Richer. I still recommend that she follows with Dr Zella Richer. Apparently, she was not available when they had to commit to the date of her surgery.

## 2012-06-17 NOTE — Telephone Encounter (Signed)
Left a message for patient to call me. 

## 2012-06-17 NOTE — Telephone Encounter (Signed)
Spoke with patient and she states her surgery has been scheduled for 06/27/12. She also states the growth on her rectum is getting bigger. She states Dr. Olevia Perches told her it needed to be lanced and to let Dr. Zella Richer know about it. She states she told him and he did not say anything. After questioning patient if she had called to let Dr. Zella Richer know the growth was getting larger, she states she had not called him. Suggested she call his office and let him know and she states she will do this.

## 2012-06-17 NOTE — Telephone Encounter (Signed)
I am glad they can do it on Sept 26. DB

## 2012-06-24 ENCOUNTER — Encounter (HOSPITAL_COMMUNITY)
Admission: RE | Admit: 2012-06-24 | Discharge: 2012-06-24 | Disposition: A | Discharge status: Home / Self Care | Payer: Medicare Other | Source: Ambulatory Visit | Attending: General Surgery | Admitting: General Surgery

## 2012-06-24 ENCOUNTER — Encounter (HOSPITAL_COMMUNITY): Payer: Self-pay

## 2012-06-24 LAB — CBC WITH DIFFERENTIAL/PLATELET
Basophils Absolute: 0.1 10*3/uL (ref 0.0–0.1)
Eosinophils Absolute: 0.1 10*3/uL (ref 0.0–0.7)
Eosinophils Relative: 1 % (ref 0–5)
HCT: 37.2 % (ref 36.0–46.0)
Lymphocytes Relative: 30 % (ref 12–46)
Lymphs Abs: 2.5 10*3/uL (ref 0.7–4.0)
MCH: 29.8 pg (ref 26.0–34.0)
MCHC: 33.6 g/dL (ref 30.0–36.0)
MCV: 88.6 fL (ref 78.0–100.0)
Monocytes Relative: 5 % (ref 3–12)
Neutro Abs: 5.3 10*3/uL (ref 1.7–7.7)
Platelets: 408 10*3/uL — ABNORMAL HIGH (ref 150–400)
RBC: 4.2 MIL/uL (ref 3.87–5.11)
RDW: 14.9 % (ref 11.5–15.5)
WBC: 8.3 10*3/uL (ref 4.0–10.5)

## 2012-06-24 LAB — COMPREHENSIVE METABOLIC PANEL
ALT: 23 U/L (ref 0–35)
AST: 25 U/L (ref 0–37)
Alkaline Phosphatase: 104 U/L (ref 39–117)
CO2: 22 mEq/L (ref 19–32)
Chloride: 105 mEq/L (ref 96–112)
GFR calc Af Amer: 57 mL/min — ABNORMAL LOW (ref >90)
GFR calc non Af Amer: 49 mL/min — ABNORMAL LOW (ref >90)
Glucose, Bld: 92 mg/dL (ref 70–99)
Sodium: 139 mEq/L (ref 135–145)
Total Bilirubin: 0.4 mg/dL (ref 0.3–1.2)

## 2012-06-24 LAB — TYPE AND SCREEN: Antibody Screen: NEGATIVE

## 2012-06-24 NOTE — Pre-Procedure Instructions (Signed)
[place]  06/24/2012   Your procedure is scheduled on: 06/27/12  Report to [facility] [id number] AM.  Call this number if you have problems the morning of surgery: [phone]   Remember:   Do not eat food ordrink:After Midnight.    Take these medicines the morning of surgery with A SIP OF WATER: none    Do not wear jewelry, make-up or nail polish.  Do not wear lotions, powders, or perfumes.   Do not shave 48 hours prior to surgery. Men may shave face and neck.  Do not bring valuables to the hospital.  Contacts, dentures or bridgework may not be worn into surgery.  Leave suitcase in the car. After surgery it may be brought to your room.  For patients admitted to the hospital, checkout time is 11:00 AM the day of discharge.   Patients discharged the day of surgery will not be allowed to drive home.  Name and phone number of your driver:   Special Instructions: Shower using CHG 2 nights before surgery and the night before surgery.  If you shower the day of surgery use CHG.  Use special wash - you have one bottle of CHG for all showers.  You should use approximately 1/3 of the bottle for each shower.   Please read over the following fact sheets that you were given: Pain Booklet, Coughing and Deep Breathing, Blood Transfusion Information, MRSA Information and Surgical Site Infection Prevention

## 2012-06-24 NOTE — Consult Note (Signed)
Anesthesia Chart Review:  Patient is a 66 year old female posted for laparoscopic, possible open, intestinal resection with ileostomy or colostomy for radiation colitis/proctitis on 06/27/12 by Dr. Zella Richer.  She has a history of ovarian cancer treated with TAH/BSO and radiation at [facility] '69, small bowel obstruction s/p exploratory laparotomy with LOA, terminal ileum descending colon resection with terminal ileum to transverse colon anastomosis, omental revascularization the pelvis with an omental pedicle flap '01 ([facility]), non-smoker, childhood asthma, C. Difficile colitis, cholecytstectomy. 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  CT Surgeon Dr.  Roxan Hockey feels she may proceed with this procedure from his standpoint.  He plans to see her in follow-up with a chest CT in one year.    Anesthesia history includes post-operative N/V and DIFFICULT INTUBATION.  According to her anesthesia record from 04/25/2012, difficult airway was anticipated due to her prominent dentition and limited oral opening. There was an unsuccessful intubation attent using a #3 MAC blade.  Her Anesthesiologist successfully intubated her with a 7.5 mm ETT using direct laryngoscopy and #2 Miller blade. Bougie #15 Fr introducer with cricoid pressure were also utilized.  (See anesthesia record in Epic for complete details.)  Her last EKG on 04/26/12 showed NSR.    CXR report on 06/25/11 showed: Postsurgical changes in the right lower lung. Small right pleural effusion, decreased. No focal consolidation. No  pneumothorax. The heart is normal in size. Bilateral breast augmentation. Visualized osseous structures are within normal limits. Cholecystectomy clips.  Labs noted.    Anticipate she may proceed as planned.  Her assigned Anesthesiologist will discuss the definitive anesthesia plan with Ms. Rodberg on the day of surgery.  Myra Gianotti, PA-C

## 2012-06-24 NOTE — Consult Note (Signed)
WOC consult for pre operative stoma site marking Pt to have possible ileostomy or colostomy Thursday 06/27/12, here today for preadmission. WOC requested to mark and answer questions.  Marked pt for both ileostomy and colostomy within the rectus muscle, avoiding the belt line. Evaluated pt. Lying, sitting and standing to make sure marked sites are within her visual field. Marked at aprox. 5cm to the right and 4 cm to the left of the umbilicus, aprox 1cm below.  Covered with thin film dressing to keep from washing before surgery date.  Answered questions about intimacy, and stoma creation.  Explained the differences between small bowel ostomy and large bowel ostomy, output, diet needs.  Will follow pt after surgery for further teaching.  Pt provided with educational booklet and sample 1pc and 2pc pouch.  Randall, [place]

## 2012-06-24 NOTE — Progress Notes (Signed)
Desiree Adams, ostomy nurse, contacted. She came to preadmission  To mark ostomy site for surgery and to answer any questions the pt. May have.

## 2012-06-25 ENCOUNTER — Other Ambulatory Visit (INDEPENDENT_AMBULATORY_CARE_PROVIDER_SITE_OTHER): Payer: Self-pay | Admitting: General Surgery

## 2012-06-25 NOTE — Anesthesia Preprocedure Evaluation (Addendum)
Anesthesia Evaluation  Patient identified by MRN, date of birth, ID band Patient awake    Reviewed: Allergy & Precautions, H&P , NPO status , Patient's Chart, lab work & pertinent test results  History of Anesthesia Complications (+) PONV and DIFFICULT AIRWAY  Airway Mallampati: II TM Distance: >3 FB Neck ROM: Full    Dental  (+) Teeth Intact and Dental Advisory Given   Pulmonary asthma ,  RLL lobectomy  in July 2013   Pulmonary exam normal       Cardiovascular Exercise Tolerance: Good Rhythm:Regular Rate:Normal     Neuro/Psych negative neurological ROS  negative psych ROS   GI/Hepatic Neg liver ROS, PUD, Chronic Diarrhea/ Proctotitis   Endo/Other  negative endocrine ROS  Renal/GU negative Renal ROS     Musculoskeletal negative musculoskeletal ROS (+)   Abdominal   Peds  Hematology negative hematology ROS (+)   Anesthesia Other Findings   Reproductive/Obstetrics negative OB ROS                        Anesthesia Physical Anesthesia Plan  ASA: III  Anesthesia Plan: General   Post-op Pain Management:    Induction: Intravenous  Airway Management Planned: Oral ETT and Video Laryngoscope Planned  Additional Equipment:   Intra-op Plan:   Post-operative Plan: Extubation in OR  Informed Consent:   Dental advisory given  Plan Discussed with: CRNA and Surgeon  Anesthesia Plan Comments: (See Anesthesia note.  Patient with difficult airway history.  Myra Gianotti, PA-C)      Anesthesia Quick Evaluation

## 2012-06-26 MED ORDER — CEFOXITIN SODIUM 2 G IV SOLR
2.0000 g | INTRAVENOUS | Status: AC
  Administered 2012-06-27: 2 g via INTRAVENOUS
  Filled 2012-06-26: qty 2

## 2012-06-27 ENCOUNTER — Encounter (HOSPITAL_COMMUNITY): Payer: Self-pay | Admitting: Vascular Surgery

## 2012-06-27 ENCOUNTER — Inpatient Hospital Stay (HOSPITAL_COMMUNITY): Payer: Medicare Other | Admitting: Vascular Surgery

## 2012-06-27 ENCOUNTER — Inpatient Hospital Stay (HOSPITAL_COMMUNITY)
Admission: RE | Admit: 2012-06-27 | Discharge: 2012-06-30 | DRG: 331 | Disposition: A | Discharge status: Home / Self Care | Payer: Medicare Other | Source: Ambulatory Visit | Attending: General Surgery | Admitting: General Surgery

## 2012-06-27 ENCOUNTER — Encounter (HOSPITAL_COMMUNITY): Payer: Self-pay | Admitting: *Deleted

## 2012-06-27 ENCOUNTER — Encounter (HOSPITAL_COMMUNITY)
Admission: RE | Disposition: A | Discharge status: Home / Self Care | Payer: Self-pay | Source: Ambulatory Visit | Attending: General Surgery

## 2012-06-27 DIAGNOSIS — Z01818 Encounter for other preprocedural examination: Secondary | ICD-10-CM

## 2012-06-27 DIAGNOSIS — Z859 Personal history of malignant neoplasm, unspecified: Secondary | ICD-10-CM

## 2012-06-27 DIAGNOSIS — Z8543 Personal history of malignant neoplasm of ovary: Secondary | ICD-10-CM

## 2012-06-27 DIAGNOSIS — Z01812 Encounter for preprocedural laboratory examination: Secondary | ICD-10-CM

## 2012-06-27 DIAGNOSIS — T66XXXA Radiation sickness, unspecified, initial encounter: Secondary | ICD-10-CM

## 2012-06-27 DIAGNOSIS — E538 Deficiency of other specified B group vitamins: Secondary | ICD-10-CM | POA: Diagnosis present

## 2012-06-27 DIAGNOSIS — Y842 Radiological procedure and radiotherapy as the cause of abnormal reaction of the patient, or of later complication, without mention of misadventure at the time of the procedure: Secondary | ICD-10-CM | POA: Diagnosis present

## 2012-06-27 DIAGNOSIS — K520 Gastroenteritis and colitis due to radiation: Secondary | ICD-10-CM | POA: Diagnosis present

## 2012-06-27 DIAGNOSIS — K6289 Other specified diseases of anus and rectum: Principal | ICD-10-CM | POA: Diagnosis present

## 2012-06-27 LAB — CBC
Hemoglobin: 10.6 g/dL — ABNORMAL LOW (ref 12.0–15.0)
MCH: 29.8 pg (ref 26.0–34.0)
RBC: 3.56 MIL/uL — ABNORMAL LOW (ref 3.87–5.11)

## 2012-06-27 LAB — CREATININE, SERUM: Creatinine, Ser: 1.11 mg/dL — ABNORMAL HIGH (ref 0.50–1.10)

## 2012-06-27 SURGERY — CREATION, COLOSTOMY, DIVERTING, LAPAROSCOPIC
Anesthesia: General | Site: Abdomen | Wound class: Clean Contaminated

## 2012-06-27 MED ORDER — HYDROMORPHONE HCL PF 1 MG/ML IJ SOLN
INTRAMUSCULAR | Status: AC
  Administered 2012-06-27: 0.5 mg via INTRAVENOUS
  Filled 2012-06-27: qty 1

## 2012-06-27 MED ORDER — HYDROMORPHONE HCL PF 1 MG/ML IJ SOLN
0.2500 mg | INTRAMUSCULAR | Status: DC | PRN
  Administered 2012-06-27 (×2): 0.5 mg via INTRAVENOUS

## 2012-06-27 MED ORDER — ONDANSETRON HCL 4 MG/2ML IJ SOLN
4.0000 mg | Freq: Once | INTRAMUSCULAR | Status: AC | PRN
  Administered 2012-06-27: 4 mg via INTRAVENOUS

## 2012-06-27 MED ORDER — FENTANYL CITRATE 0.05 MG/ML IJ SOLN
INTRAMUSCULAR | Status: DC | PRN
  Administered 2012-06-27: 150 ug via INTRAVENOUS
  Administered 2012-06-27: 100 ug via INTRAVENOUS

## 2012-06-27 MED ORDER — KCL IN DEXTROSE-NACL 20-5-0.9 MEQ/L-%-% IV SOLN
INTRAVENOUS | Status: DC
  Administered 2012-06-27 – 2012-06-28 (×3): via INTRAVENOUS
  Filled 2012-06-27 (×6): qty 1000

## 2012-06-27 MED ORDER — PHENYLEPHRINE HCL 10 MG/ML IJ SOLN
INTRAMUSCULAR | Status: DC | PRN
  Administered 2012-06-27: 80 ug via INTRAVENOUS
  Administered 2012-06-27: 40 ug via INTRAVENOUS
  Administered 2012-06-27: 120 ug via INTRAVENOUS

## 2012-06-27 MED ORDER — 0.9 % SODIUM CHLORIDE (POUR BTL) OPTIME
TOPICAL | Status: DC | PRN
  Administered 2012-06-27: 1000 mL

## 2012-06-27 MED ORDER — LACTATED RINGERS IV SOLN
INTRAVENOUS | Status: DC
  Administered 2012-06-27 (×3): via INTRAVENOUS

## 2012-06-27 MED ORDER — MIDAZOLAM HCL 5 MG/5ML IJ SOLN
INTRAMUSCULAR | Status: DC | PRN
  Administered 2012-06-27 (×2): 1 mg via INTRAVENOUS

## 2012-06-27 MED ORDER — ENOXAPARIN SODIUM 30 MG/0.3ML ~~LOC~~ SOLN
30.0000 mg | SUBCUTANEOUS | Status: DC
Start: 2012-06-28 — End: 2012-06-30
  Administered 2012-06-28 – 2012-06-29 (×2): 30 mg via SUBCUTANEOUS
  Filled 2012-06-27 (×3): qty 0.3

## 2012-06-27 MED ORDER — ONDANSETRON HCL 4 MG/2ML IJ SOLN
INTRAMUSCULAR | Status: AC
  Administered 2012-06-27: 4 mg via INTRAVENOUS
  Filled 2012-06-27: qty 2

## 2012-06-27 MED ORDER — LIDOCAINE HCL 1 % IJ SOLN
INTRAMUSCULAR | Status: DC | PRN
  Administered 2012-06-27: 16:00:00 via INTRADERMAL

## 2012-06-27 MED ORDER — GLYCOPYRROLATE 0.2 MG/ML IJ SOLN
INTRAMUSCULAR | Status: DC | PRN
  Administered 2012-06-27: 0.4 mg via INTRAVENOUS

## 2012-06-27 MED ORDER — ACETAMINOPHEN 10 MG/ML IV SOLN
INTRAVENOUS | Status: AC
  Filled 2012-06-27: qty 100

## 2012-06-27 MED ORDER — OXYCODONE HCL 5 MG/5ML PO SOLN: 5.0000 mg | Freq: Once | ORAL | Status: DC | PRN

## 2012-06-27 MED ORDER — PROPOFOL 10 MG/ML IV BOLUS
INTRAVENOUS | Status: DC | PRN
  Administered 2012-06-27: 130 mg via INTRAVENOUS

## 2012-06-27 MED ORDER — MORPHINE SULFATE 2 MG/ML IJ SOLN
2.0000 mg | INTRAMUSCULAR | Status: DC | PRN
  Administered 2012-06-27: 4 mg via INTRAVENOUS
  Administered 2012-06-27: 2 mg via INTRAVENOUS
  Administered 2012-06-27 – 2012-06-28 (×2): 6 mg via INTRAVENOUS
  Administered 2012-06-28: 4 mg via INTRAVENOUS
  Administered 2012-06-29: 6 mg via INTRAVENOUS
  Filled 2012-06-27: qty 2
  Filled 2012-06-27 (×2): qty 3
  Filled 2012-06-27: qty 1
  Filled 2012-06-27 (×2): qty 3

## 2012-06-27 MED ORDER — MEPERIDINE HCL 25 MG/ML IJ SOLN: 6.2500 mg | INTRAMUSCULAR | Status: DC | PRN

## 2012-06-27 MED ORDER — DEXTROSE 5 % IV SOLN
INTRAVENOUS | Status: DC | PRN
  Administered 2012-06-27 (×2): via INTRAVENOUS

## 2012-06-27 MED ORDER — ROCURONIUM BROMIDE 100 MG/10ML IV SOLN
INTRAVENOUS | Status: DC | PRN
  Administered 2012-06-27: 35 mg via INTRAVENOUS

## 2012-06-27 MED ORDER — ONDANSETRON HCL 4 MG/2ML IJ SOLN
INTRAMUSCULAR | Status: DC | PRN
  Administered 2012-06-27: 4 mg via INTRAVENOUS

## 2012-06-27 MED ORDER — SUCCINYLCHOLINE CHLORIDE 20 MG/ML IJ SOLN
INTRAMUSCULAR | Status: DC | PRN
  Administered 2012-06-27: 100 mg via INTRAVENOUS

## 2012-06-27 MED ORDER — ACETAMINOPHEN 10 MG/ML IV SOLN
INTRAVENOUS | Status: DC | PRN
  Administered 2012-06-27: 1000 mg via INTRAVENOUS

## 2012-06-27 MED ORDER — ONDANSETRON HCL 4 MG/2ML IJ SOLN
4.0000 mg | Freq: Four times a day (QID) | INTRAMUSCULAR | Status: DC | PRN
  Administered 2012-06-27 – 2012-06-28 (×2): 4 mg via INTRAVENOUS
  Filled 2012-06-27 (×2): qty 2

## 2012-06-27 MED ORDER — NEOSTIGMINE METHYLSULFATE 1 MG/ML IJ SOLN
INTRAMUSCULAR | Status: DC | PRN
  Administered 2012-06-27: 3 mg via INTRAVENOUS

## 2012-06-27 MED ORDER — LIDOCAINE HCL (CARDIAC) 20 MG/ML IV SOLN
INTRAVENOUS | Status: DC | PRN
  Administered 2012-06-27: 100 mg via INTRAVENOUS

## 2012-06-27 MED ORDER — ONDANSETRON HCL 4 MG PO TABS: 4.0000 mg | ORAL_TABLET | Freq: Four times a day (QID) | ORAL | Status: DC | PRN

## 2012-06-27 MED ORDER — OXYCODONE HCL 5 MG PO TABS: 5.0000 mg | ORAL_TABLET | Freq: Once | ORAL | Status: DC | PRN

## 2012-06-27 MED ORDER — OXYCODONE-ACETAMINOPHEN 5-325 MG PO TABS
1.0000 | ORAL_TABLET | ORAL | Status: DC | PRN
  Administered 2012-06-28 (×2): 1 via ORAL
  Administered 2012-06-28 – 2012-06-30 (×4): 2 via ORAL
  Filled 2012-06-27 (×2): qty 2
  Filled 2012-06-27: qty 1
  Filled 2012-06-27 (×3): qty 2

## 2012-06-27 MED ORDER — BUPIVACAINE-EPINEPHRINE PF 0.25-1:200000 % IJ SOLN
INTRAMUSCULAR | Status: AC
  Filled 2012-06-27: qty 30

## 2012-06-27 SURGICAL SUPPLY — 85 items
ADH SKN CLS APL DERMABOND .7 (GAUZE/BANDAGES/DRESSINGS) ×2
ADH SKN CLS LQ APL DERMABOND (GAUZE/BANDAGES/DRESSINGS) ×2
APL SRG 32X5 SNPLK LF DISP (MISCELLANEOUS)
BAG SPEC RTRVL LRG 6X4 10 (ENDOMECHANICALS)
BLADE SURG 10 STRL SS (BLADE) ×3 IMPLANT
BLADE SURG ROTATE 9660 (MISCELLANEOUS) IMPLANT
CANISTER SUCTION 2500CC (MISCELLANEOUS) ×3 IMPLANT
CHLORAPREP W/TINT 26ML (MISCELLANEOUS) ×3 IMPLANT
CLOTH BEACON ORANGE TIMEOUT ST (SAFETY) ×3 IMPLANT
COVER SURGICAL LIGHT HANDLE (MISCELLANEOUS) ×3 IMPLANT
DERMABOND ADHESIVE PROPEN (GAUZE/BANDAGES/DRESSINGS) ×1
DERMABOND ADVANCED (GAUZE/BANDAGES/DRESSINGS) ×1
DERMABOND ADVANCED .7 DNX12 (GAUZE/BANDAGES/DRESSINGS) ×2 IMPLANT
DERMABOND ADVANCED .7 DNX6 (GAUZE/BANDAGES/DRESSINGS) ×1 IMPLANT
DEVICE TROCAR PUNCTURE CLOSURE (ENDOMECHANICALS) IMPLANT
DRAIN CHANNEL 19F RND (DRAIN) IMPLANT
DRAIN PENROSE 1/2X36 STERILE (WOUND CARE) IMPLANT
DRAPE UTILITY 15X26 W/TAPE STR (DRAPE) ×6 IMPLANT
DRAPE WARM FLUID 44X44 (DRAPE) ×3 IMPLANT
DRSG COVADERM 4X10 (GAUZE/BANDAGES/DRESSINGS) IMPLANT
DRSG COVADERM 4X14 (GAUZE/BANDAGES/DRESSINGS) IMPLANT
ELECT BLADE 6.5 EXT (BLADE) IMPLANT
ELECT CAUTERY BLADE 6.4 (BLADE) ×2 IMPLANT
ELECT REM PT RETURN 9FT ADLT (ELECTROSURGICAL) ×3
ELECTRODE REM PT RTRN 9FT ADLT (ELECTROSURGICAL) ×2 IMPLANT
EVACUATOR SILICONE 100CC (DRAIN) IMPLANT
GLOVE BIO SURGEON STRL SZ 6 (GLOVE) ×3 IMPLANT
GLOVE BIO SURGEON STRL SZ7 (GLOVE) ×2 IMPLANT
GLOVE BIOGEL PI IND STRL 6.5 (GLOVE) ×2 IMPLANT
GLOVE BIOGEL PI IND STRL 7.0 (GLOVE) ×1 IMPLANT
GLOVE BIOGEL PI IND STRL 7.5 (GLOVE) ×1 IMPLANT
GLOVE BIOGEL PI INDICATOR 6.5 (GLOVE) ×1
GLOVE BIOGEL PI INDICATOR 7.0 (GLOVE) ×1
GLOVE BIOGEL PI INDICATOR 7.5 (GLOVE) ×1
GLOVE ECLIPSE 6.5 STRL STRAW (GLOVE) ×2 IMPLANT
GOWN PREVENTION PLUS XXLARGE (GOWN DISPOSABLE) ×1 IMPLANT
GOWN STRL NON-REIN LRG LVL3 (GOWN DISPOSABLE) ×11 IMPLANT
KIT BASIN OR (CUSTOM PROCEDURE TRAY) ×3 IMPLANT
KIT COLOSTOMY ILEOSTOMY 2.75 (WOUND CARE) ×2 IMPLANT
KIT ROOM TURNOVER OR (KITS) ×3 IMPLANT
LOOP VESSEL MAXI BLUE (MISCELLANEOUS) IMPLANT
NS IRRIG 1000ML POUR BTL (IV SOLUTION) ×6 IMPLANT
PAD ARMBOARD 7.5X6 YLW CONV (MISCELLANEOUS) ×6 IMPLANT
PENCIL BUTTON HOLSTER BLD 10FT (ELECTRODE) ×3 IMPLANT
POUCH SPECIMEN RETRIEVAL 10MM (ENDOMECHANICALS) ×1 IMPLANT
RELOAD BLUE (STAPLE) IMPLANT
RELOAD WHITE ECR60W (STAPLE) IMPLANT
RETRACTOR WND ALEXIS 25 LRG (MISCELLANEOUS) IMPLANT
RTRCTR WOUND ALEXIS 25CM LRG (MISCELLANEOUS)
SCALPEL HARMONIC ACE (MISCELLANEOUS) ×1 IMPLANT
SCISSORS LAP 5X35 DISP (ENDOMECHANICALS) IMPLANT
SEALANT SURGICAL APPL DUAL CAN (MISCELLANEOUS) IMPLANT
SET IRRIG TUBING LAPAROSCOPIC (IRRIGATION / IRRIGATOR) ×3 IMPLANT
SLEEVE ENDOPATH XCEL 5M (ENDOMECHANICALS) ×6 IMPLANT
SPONGE GAUZE 4X4 12PLY (GAUZE/BANDAGES/DRESSINGS) ×2 IMPLANT
STAPLE ECHEON FLEX 60 POW ENDO (STAPLE) IMPLANT
STAPLER PROXIMATE 75MM BLUE (STAPLE) ×2 IMPLANT
STAPLER VISISTAT 35W (STAPLE) IMPLANT
SUT ETHILON 2 0 FS 18 (SUTURE) IMPLANT
SUT MNCRL AB 4-0 PS2 18 (SUTURE) ×3 IMPLANT
SUT PDS AB 1 TP1 96 (SUTURE) IMPLANT
SUT PDS II 0 TP-1 LOOPED 60 (SUTURE) IMPLANT
SUT PROLENE 2 0 CT2 30 (SUTURE) ×2 IMPLANT
SUT SILK 2 0 SH CR/8 (SUTURE) IMPLANT
SUT SILK 2 0 TIES 10X30 (SUTURE) IMPLANT
SUT VIC AB 2-0 SH 18 (SUTURE) ×2 IMPLANT
SUT VIC AB 3-0 SH 18 (SUTURE) ×4 IMPLANT
SUT VICRYL 0 UR6 27IN ABS (SUTURE) IMPLANT
SUT VICRYL AB 2 0 TIES (SUTURE) IMPLANT
SUT VICRYL AB 3 0 TIES (SUTURE) IMPLANT
SYS LAPSCP GELPORT 120MM (MISCELLANEOUS)
SYSTEM LAPSCP GELPORT 120MM (MISCELLANEOUS) IMPLANT
TIP INNERVISION DETACH 40FR (MISCELLANEOUS) IMPLANT
TIP INNERVISION DETACH 56FR (MISCELLANEOUS) IMPLANT
TIPS INNERVISION DETACH 40FR (MISCELLANEOUS)
TOWEL OR 17X24 6PK STRL BLUE (TOWEL DISPOSABLE) ×3 IMPLANT
TOWEL OR 17X26 10 PK STRL BLUE (TOWEL DISPOSABLE) ×3 IMPLANT
TRAY FOLEY CATH 14FRSI W/METER (CATHETERS) ×3 IMPLANT
TRAY LAPAROSCOPIC (CUSTOM PROCEDURE TRAY) ×3 IMPLANT
TROCAR XCEL 12X100 BLDLESS (ENDOMECHANICALS) ×3 IMPLANT
TROCAR XCEL BLUNT TIP 100MML (ENDOMECHANICALS) IMPLANT
TROCAR XCEL NON-BLD 5MMX100MML (ENDOMECHANICALS) ×3 IMPLANT
TUBE CONNECTING 12X1/4 (SUCTIONS) ×3 IMPLANT
TUBING FILTER THERMOFLATOR (ELECTROSURGICAL) ×3 IMPLANT
YANKAUER SUCT BULB TIP NO VENT (SUCTIONS) ×3 IMPLANT

## 2012-06-27 NOTE — Preoperative (Signed)
Beta Blockers   Reason not to administer Beta Blockers:Not Applicable 

## 2012-06-27 NOTE — H&P (View-Only) (Signed)
Patient ID: Desiree Adams, female   DOB: 1946-03-29, 66 y.o.   MRN: [id number]  Chief Complaint  Patient presents with  . Pre-op Exam    HPI Desiree Adams is a 66 y.o. female.   HPI  She did well with her right lower lobectomy for what turned out to be a carcinoid. She is here to reschedule her abdominal surgery. She is still having 20 some bowel movements a day and has significant pain related to her radiation proctitis.  Past Medical History  Diagnosis Date  . Small bowel obstruction   . 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 History of Clostridium difficile infection   . Vitamin B12 deficiency   . Biliary colic   . PONV (postoperative nausea and vomiting)     always nausea and  vomiting  . Difficult intubation   . Right lower lobe lung mass   . Asthma     as a "baby"  . Urinary tract infection   . Ovarian cancer   . Carcinoid tumor, bronchus, lung, malignant     Past Surgical History  Procedure Date  . Total abdominal hysterectomy w/ bilateral salpingoophorectomy   . Appendectomy   . Laparoscopic cholecystectomy   . Breast enhancement surgery   . Exploratory laparotomy w/ bowel resection   . Colonoscopy 12/25/2011    Procedure: COLONOSCOPY;  Surgeon: Lafayette Dragon, MD;  Location: [facility];  Service: Endoscopy;  Laterality: N/A;  . Lung removal, partial     Family History  Problem Relation Age of Onset  . Kidney cancer      ???? Grandfather  . Colon cancer Neg Hx     Social History History  Substance Use Topics  . Smoking status: Never Smoker   . Smokeless tobacco: Never Used  . Alcohol Use: Yes     very rarely    Allergies  Allergen Reactions  . Sulfamethoxazole     REACTION: unspecified    Current Outpatient Prescriptions  Medication Sig Dispense Refill  . cyanocobalamin (,VITAMIN B-12,) 1000 MCG/ML injection Inject 1 ml or 1000 mcg IM once a week for 3 weeks and  then once a month for 5 months  3 mL  5  . loperamide (IMODIUM) 2 MG capsule Take 2 mg by mouth 2 (two) times daily.      . Multiple Vitamin (MULTIVITAMIN) tablet Take 1 tablet by mouth daily.        Review of Systems Review of Systems  Constitutional: Negative for fever and chills.  Gastrointestinal: Positive for rectal pain.    Blood pressure 114/70, pulse 68, temperature 97.3 F (36.3 C), temperature source Temporal, resp. rate 16, height 5\' 4"  (1.626 m), weight 118 lb 2 oz (53.581 kg).  Physical Exam Physical Exam  Constitutional: She appears well-developed and well-nourished. No distress.  HENT:  Head: Normocephalic and atraumatic.  Cardiovascular: Normal rate and regular rhythm.   Pulmonary/Chest:       Slightly decreased breath sounds in the right base. There are well-healed small right-sided chest wall scars.  Abdominal: Soft. She exhibits no distension. There is no tenderness.       There is a long midline scar and a lower transverse scar present. There are small upper abdominal scars present.    Data Reviewed Some of the notes in EPIC  Assessment    Persistent radiation proctitis.    Plan    Laparoscopic possible open intestinal resection with  ileostomy or colostomy. The procedure and risks have been discussed with her previously.       Kratos Ruscitti J 06/06/2012, 3:29 PM

## 2012-06-27 NOTE — Anesthesia Procedure Notes (Signed)
Procedure Name: Intubation Date/Time: 06/27/2012 2:46 PM Performed by: Wanita Chamberlain Pre-anesthesia Checklist: Emergency Drugs available, Suction available, Patient being monitored, Patient identified and Timeout performed Patient Re-evaluated:Patient Re-evaluated prior to inductionOxygen Delivery Method: Circle system utilized Preoxygenation: Pre-oxygenation with 100% oxygen Intubation Type: IV induction Ventilation: Mask ventilation without difficulty Laryngoscope Size: Mac and 3 Grade View: Grade II Tube type: Oral Tube size: 7.0 mm Number of attempts: 1 Airway Equipment and Method: Video-laryngoscopy and Rigid stylet Placement Confirmation: ETT inserted through vocal cords under direct vision,  positive ETCO2,  CO2 detector and breath sounds checked- equal and bilateral Secured at: 21 cm Tube secured with: Tape Dental Injury: Teeth and Oropharynx as per pre-operative assessment  Future Recommendations: Recommend- induction with short-acting agent, and alternative techniques readily available Comments: Pt easily intubated with Video Laryngoscope 1 st attempt.  Larynx is Anterior.  Jarrett Ables CRNA

## 2012-06-27 NOTE — Anesthesia Postprocedure Evaluation (Signed)
Anesthesia Post Note  Patient: Desiree Adams  Procedure(s) Performed: Procedure(s) (LRB): LAPAROSCOPIC DIVERTED COLOSTOMY ()  Anesthesia type: general  Patient location: PACU  Post pain: Pain level controlled  Post assessment: Patient's Cardiovascular Status Stable  Last Vitals:  Filed Vitals:   06/27/12 1645  BP: 127/64  Pulse: 68  Temp:   Resp: 17    Post vital signs: Reviewed and stable  Level of consciousness: sedated  Complications: No apparent anesthesia complications

## 2012-06-27 NOTE — Progress Notes (Signed)
Spoke with dawn, wound/ostomy nurse and stated patient was marked per doctor order at preadmission appointment, which is still present upon arrival to hospital.

## 2012-06-27 NOTE — Op Note (Signed)
Operative Note  Desiree Adams female 66 y.o. 06/27/2012  PREOPERATIVE DX:  POSTOPERATIVE DX:  Same  PROCEDURE:         Surgeon: Odis Hollingshead   Assistants: Donnie Mesa, M.D.  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 She has multiple bowel movements a day into the double figure range despite medication to try to slow her down. She has radiation changes to both her rectum and the sigmoid colon was a narrowing present. She now presents for laparoscopic possible open ileostomy or colostomy to divert the fecal stream. The procedure, risks, and success rate of this were discussed with her preoperatively.    Procedure Detail:  She is brought to the operating room placed supine on the operating table and a general anesthetic was administered. A Foley catheter was inserted. The abdominal wall was widely sterilely prepped and draped.  She's placed in slight reverse Trendelenburg position. A 5 mm incision was made in the left subcostal area. Using a 5 mm Optiview trocar and laparoscope I gained access to the peritoneal cavity and a pneumoperitoneum was created. The laparoscope was introduced and there is no evidence of bleeding or organ injury. She had dense adhesions between her omentum and small bowel in the midline. A 5 mm trocar was then placed in the left pelvic area. Two 5 mm trocars were then placed one in the supraumbilical position and one in the subumbilical position under direct vision.  The omentum was noted to tract down into the pelvis and adherent to that area. Identified the proximal descending colon and traced it down to the sigmoid colon which was grossly abnormal with dense adhesions to the side walls and grossly abnormal coloration. It was very firm.  I mobilized the descending colon proximal to this and up to the splenic flexure using  sharp and blunt dissection and dividing the lateral attachments.  I chose a segment of the descending colon that appeared to be grossly normal. She had been marked for an ileostomy and colostomy sites. I was able to approximate this segment of the descending colon to the colostomy site.  I made a circular incision through the skin and subcutaneous tissue at the site of the proposed colostomy. A cruciate incision was made in the fascial layers and the peritoneal cavity was entered. The descending colon was grasped with Babcock forceps and brought up through the colostomy wound. I then divided the colon with the GIA stapler. I marked the descending stump with 2-0 Prolene sutures. I then dropped this back into the abdominal cavity. I performed laparoscopy to confirm that indeed this was the distal end.  I then inspected the abdominal cavity and there is no evidence of bleeding or organ injury and all trocars were removed. The trocar site skin incisions were closed with 4-0 Monocryl subcuticular stitches followed by application of Dermabond.  I then anchored the colon to the anterior fascia with interrupted 2-0 Vicryl sutures in 4 quadrants. The staple line was then removed and the colostomy was matured with interrupted 3-0 Vicryl sutures. The colostomy was patent and viable and under no tension. A colostomy appliance was placed.  She tolerated the procedure well without any apparent complications and was taken to the recovery room in satisfactory condition.  Estimated Blood Loss:  less than 100 mL         Drains: none  Blood Given: none  Specimens: none         Complications:  * No complications entered in OR log *         Disposition: PACU - hemodynamically stable.         Condition: stable

## 2012-06-27 NOTE — Transfer of Care (Signed)
Immediate Anesthesia Transfer of Care Note  Patient: Desiree Adams  Procedure(s) Performed: Procedure(s) (LRB) with comments: LAPAROSCOPIC DIVERTED COLOSTOMY () - Laparoscopic assisted colostomy  Patient Location: PACU  Anesthesia Type: General  Level of Consciousness: awake, alert , oriented, sedated and patient cooperative  Airway & Oxygen Therapy: Patient Spontanous Breathing and Patient connected to nasal cannula oxygen  Post-op Assessment: Report given to PACU RN and Post -op Vital signs reviewed and stable  Post vital signs: Reviewed and stable  Complications: No apparent anesthesia complications

## 2012-06-27 NOTE — Interval H&P Note (Signed)
History and Physical Interval Note:  06/27/2012 2:23 PM  Desiree Adams  has presented today for surgery, with the diagnosis of radiation colitis & proctitis  The various methods of treatment have been discussed with the patient and family. After consideration of risks, benefits and other options for treatment, the patient has consented to  Procedure(s) (LRB) with comments: LAPAROSCOPIC possible open ileostomy or colostomy with possible intestinal resection. (N/A) - Laparoscopic possible open intestinal resection with ileostomy or colostomy ILEOSTOMY (N/A) as a surgical intervention .  The patient's history has been reviewed, patient examined, no change in status, stable for surgery.  I have reviewed the patient's chart and labs.  Questions were answered to the patient's satisfaction.     Charmon Thorson Lenna Sciara

## 2012-06-28 NOTE — Progress Notes (Signed)
INITIAL ADULT NUTRITION ASSESSMENT Date: 06/28/2012   Time: 3:30 PM Reason for Assessment: MST  INTERVENTION: 1.  General healthful diet; pt denies education needs.  Discussed current nutrition status and new expectations of diet tolerance. Encouraged intake, fluids, and how to manage output should it become increased.  Pt verbalizes understanding and feels she has adequate resources in place.    DOCUMENTATION CODES Per approved criteria  -Not Applicable    ASSESSMENT: Female 66 y.o.  Dx: Radiation colitis  Hx:  Past Medical History  Diagnosis Date  . Small bowel obstruction   . 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 History of Clostridium difficile infection   . Vitamin B12 deficiency   . Biliary colic   . PONV (postoperative nausea and vomiting)     always nausea and  vomiting  . Difficult intubation   . Right lower lobe lung mass   . Asthma     as a "baby"  . Urinary tract infection   . Ovarian cancer   . Carcinoid tumor, bronchus, lung, malignant    Past Surgical History  Procedure Date  . Total abdominal hysterectomy w/ bilateral salpingoophorectomy   . Appendectomy   . Laparoscopic cholecystectomy   . Breast enhancement surgery   . Exploratory laparotomy w/ bowel resection   . Colonoscopy 12/25/2011    Procedure: COLONOSCOPY;  Surgeon: Lafayette Dragon, MD;  Location: [facility];  Service: Endoscopy;  Laterality: N/A;  . Lung removal, partial   . Video assisted thoracoscopy 04/25/12    right    Related Meds:  Scheduled Meds:   . enoxaparin (LOVENOX) injection  30 mg Subcutaneous Q24H   Continuous Infusions:   . dextrose 5 % and 0.9 % NaCl with KCl 20 mEq/L 100 mL/hr at 06/28/12 1457  . DISCONTD: lactated ringers     PRN Meds:.morphine injection, ondansetron (ZOFRAN) IV, ondansetron (ZOFRAN) IV, ondansetron, oxyCODONE-acetaminophen, DISCONTD: 0.9 % irrigation (POUR BTL),  DISCONTD:  HYDROmorphone (DILAUDID) injection, DISCONTD: Marcaine 0.25% w/EPINEPHrine 1:200,000 20 mL with lidocaine 1% 20 mL injection, DISCONTD: meperidine (DEMEROL) injection, DISCONTD: oxyCODONE, DISCONTD: oxyCODONE   Ht: 5\' 4"  (162.6 cm)  Wt: 128 lb (58.06 kg)  Ideal Wt: 120 lbs % Ideal Wt: 106%  Usual Wt: 137 lbs per chart review % Usual Wt: 93%  Body mass index is 21.97 kg/(m^2).  Food/Nutrition Related Hx: progressive wt loss for 2 years, excessive diarrhea PTA  Labs:  CMP     Component Value Date/Time   NA 139 06/24/2012 1155   K 3.8 06/24/2012 1155   CL 105 06/24/2012 1155   CO2 22 06/24/2012 1155   GLUCOSE 92 06/24/2012 1155   BUN 13 06/24/2012 1155   CREATININE 1.11* 06/27/2012 1751   CREATININE 1.42* 03/22/2012 1545   CALCIUM 9.6 06/24/2012 1155   PROT 7.3 06/24/2012 1155   ALBUMIN 3.4* 06/24/2012 1155   AST 25 06/24/2012 1155   ALT 23 06/24/2012 1155   ALKPHOS 104 06/24/2012 1155   BILITOT 0.4 06/24/2012 1155   GFRNONAA 51* 06/27/2012 1751   GFRAA 59* 06/27/2012 1751    CBC    Component Value Date/Time   WBC 11.3* 06/27/2012 1751   RBC 3.56* 06/27/2012 1751   HGB 10.6* 06/27/2012 1751   HCT 31.8* 06/27/2012 1751   PLT 326 06/27/2012 1751   MCV 89.3 06/27/2012 1751   MCH 29.8 06/27/2012 1751   MCHC 33.3 06/27/2012 1751   RDW 15.0 06/27/2012  1751   LYMPHSABS 2.5 06/24/2012 1155   MONOABS 0.4 06/24/2012 1155   EOSABS 0.1 06/24/2012 1155   BASOSABS 0.1 06/24/2012 1155   Intake: 25-75% Output:   Intake/Output Summary (Last 24 hours) at 06/28/12 1532 Last data filed at 06/28/12 1337  Gross per 24 hour  Intake 2936.67 ml  Output    945 ml  Net 1991.67 ml   No stool since admission.  Diet Order: Full Liquid  Supplements/Tube Feeding: none at this time  IVF:    dextrose 5 % and 0.9 % NaCl with KCl 20 mEq/L Last Rate: 100 mL/hr at 06/28/12 1457  DISCONTD: lactated ringers     Estimated Nutritional Needs:   Kcal: 1740-1860 Protein: 75-90g Fluid: >1.8 L/day  Pt  reports severe diarrhea PTA r/t radiation colitis.  Pt states problem is chronic but had worsened in the last two years. She reports stooling up to 30x/day.  Pt is s/p end colostomy and is tolerating full liquids.  Reviewed home diet with pt.  Over the years she has figured out what she tolerates best- low fiber, bland, well-cooked foods.  Discussed the need to continue this diet with new colostomy.   Encouraged pt to continue with MVI/MM and increased fluid intake.  Pt is very confident she can manage nutrition at home.  Encouraged pt to discuss nutrition and weight status with outpatient providers.   Food preferences obtained.  Will note in [facility] for food service staff. Pt questions whether she can tolerate lactose at this time and would rather avoid it for now.  Diet order updated.  NUTRITION DIAGNOSIS: Unintended wt loss  RELATED TO: colitis, decreased PO tolerance  AS EVIDENCE BY: pt report of progressive wt loss over 2 yrs.  MONITORING/EVALUATION(Goals): 1.  Food/Beverage; diet advancement per MD discretion based on pt tolerance.  EDUCATION NEEDS: -Education needs addressed   Brynda Greathouse, MS RD LDN Clinical Inpatient Dietitian Pager: [phone] Weekend/After hours pager: [phone]

## 2012-06-28 NOTE — Consult Note (Addendum)
WOC ostomy consult  Stoma type/location:  LLQ, end colostomy Stomal assessment/size: 1 3/4" round, slightly budded from the skin Peristomal assessment: did not change pouch today but will orders supplies for the weekend should they need to Output only scant bloody drainage in the pouch Ostomy pouching: 2pc. 2 3/4" pouch in place will order supplies for smaller wafer at 2 1/4" and barrier ring as it appears stoma may be slightly flush with the skin at 6 o'clock.  Preoperative educational booklet given to pt. Will plan teaching session with pt Monday to prepare for dc WOC will follow along with you [place], Desiree Adams   10:14am returned visit to teach Desiree Adams pouch change, due to surgeon may allow to dc over the weekend.  She removed old pouch, cleansed her peristomal skin. She then cut new wafer at 1 3/4" round and I applied barrier ring around stoma due to slightly flush at 6 oclock.  She applied new wafer and attached new pouch to wafer. She will work with staff over the weekend on empty with lock and roll closure.  Marica Otter RN, CWOCN

## 2012-06-28 NOTE — Progress Notes (Addendum)
Pt continue to complain of n/v. Administered zofran as ordered.  Emesis is green, bile like. Refused to have foley d/c am would like to sleep

## 2012-06-28 NOTE — Progress Notes (Signed)
1 Day Post-Op  Subjective: Comfortable.  Had n/v last night.  Better this AM.  Objective: Vital signs in last 24 hours: Temp:  [97.5 F (36.4 C)-98.6 F (37 C)] 97.8 F (36.6 C) (09/27 0536) Pulse Rate:  [62-86] 74  (09/27 0536) Resp:  [17-35] 18  (09/27 0536) BP: (116-131)/(57-79) 116/57 mmHg (09/27 0536) SpO2:  [97 %-100 %] 100 % (09/27 0536) Weight:  [128 lb (58.06 kg)] 128 lb (58.06 kg) (09/26 2047) Last BM Date: 06/27/12  Intake/Output from previous day: 09/26 0701 - 09/27 0700 In: 3246.7 [P.O.:60; I.V.:3186.7] Out: 645 [Urine:620; Blood:25] Intake/Output this shift:    PE: Abd-soft, incisions clean/dry/intact, stoma pink and edematous with no gas or stool output  Lab Results:   Basename 06/27/12 1751  WBC 11.3*  HGB 10.6*  HCT 31.8*  PLT 326   BMET  Basename 06/27/12 1751  NA --  K --  CL --  CO2 --  GLUCOSE --  BUN --  CREATININE 1.11*  CALCIUM --   PT/INR No results found for this basename: LABPROT:2,INR:2 in the last 72 hours Comprehensive Metabolic Panel:    Component Value Date/Time   NA 139 06/24/2012 1155   K 3.8 06/24/2012 1155   CL 105 06/24/2012 1155   CO2 22 06/24/2012 1155   BUN 13 06/24/2012 1155   CREATININE 1.11* 06/27/2012 1751   CREATININE 1.42* 03/22/2012 1545   GLUCOSE 92 06/24/2012 1155   CALCIUM 9.6 06/24/2012 1155   AST 25 06/24/2012 1155   ALT 23 06/24/2012 1155   ALKPHOS 104 06/24/2012 1155   BILITOT 0.4 06/24/2012 1155   PROT 7.3 06/24/2012 1155   ALBUMIN 3.4* 06/24/2012 1155     Studies/Results: No results found.  Anti-infectives: Anti-infectives     Start     Dose/Rate Route Frequency Ordered Stop   06/26/12 1424   cefOXitin (MEFOXIN) 2 g in dextrose 5 % 50 mL IVPB        2 g 100 mL/hr over 30 Minutes Intravenous 60 min pre-op 06/26/12 1424 06/27/12 1430          Assessment Principal Problem:  *Radiation colitis s/p diverting colostomy-feeling better this AM.    LOS: 1 day   Plan: Full liquids.  Will not  advance past this until colostomy is functioning.   Jazier Mcglamery J 06/28/2012  \

## 2012-06-28 NOTE — Progress Notes (Signed)
Spoke with pt for [facility] needs at d/c.  Address in [facility] is correct.  Phone listed is the home phone.  Alternate number is the cell, [phone]. Pt presented choice of [place] agency and chose [facility].  Arrangements made. No DME needed.

## 2012-06-29 NOTE — Progress Notes (Signed)
Patient ID: Desiree Adams, female   DOB: 12-29-1945, 66 y.o.   MRN: [id number] 2 Days Post-Op  Subjective: Colostomy bag filled up last night and had to be emptied, did have some increased abd pain while having bm in bag but none at rest or at current, denies nausea or vomiting, tolerating diet and ready for more to eat, walking well  Objective: Vital signs in last 24 hours: Temp:  [98 F (36.7 C)-99.4 F (37.4 C)] 98.9 F (37.2 C) (09/28 0553) Pulse Rate:  [72-89] 89  (09/28 0553) Resp:  [18] 18  (09/28 0553) BP: (100-127)/(50-66) 124/66 mmHg (09/28 0553) SpO2:  [97 %-100 %] 97 % (09/28 0553) Last BM Date: 06/29/12  Intake/Output from previous day: 09/27 0701 - 09/28 0700 In: 2308.3 [P.O.:1320; I.V.:988.3] Out: 850 [Urine:850] Intake/Output this shift: Total I/O In: -  Out: 400 [Stool:400]  PE: Abd-soft, incisions clean/dry/intact, stoma pink, liquid stool and gas in bag  Lab Results:   Basename 06/27/12 1751  WBC 11.3*  HGB 10.6*  HCT 31.8*  PLT 326   BMET  Basename 06/27/12 1751  NA --  K --  CL --  CO2 --  GLUCOSE --  BUN --  CREATININE 1.11*  CALCIUM --   PT/INR No results found for this basename: LABPROT:2,INR:2 in the last 72 hours Comprehensive Metabolic Panel:    Component Value Date/Time   NA 139 06/24/2012 1155   K 3.8 06/24/2012 1155   CL 105 06/24/2012 1155   CO2 22 06/24/2012 1155   BUN 13 06/24/2012 1155   CREATININE 1.11* 06/27/2012 1751   CREATININE 1.42* 03/22/2012 1545   GLUCOSE 92 06/24/2012 1155   CALCIUM 9.6 06/24/2012 1155   AST 25 06/24/2012 1155   ALT 23 06/24/2012 1155   ALKPHOS 104 06/24/2012 1155   BILITOT 0.4 06/24/2012 1155   PROT 7.3 06/24/2012 1155   ALBUMIN 3.4* 06/24/2012 1155     Studies/Results: No results found.  Anti-infectives: Anti-infectives     Start     Dose/Rate Route Frequency Ordered Stop   06/26/12 1424   cefOXitin (MEFOXIN) 2 g in dextrose 5 % 50 mL IVPB        2 g 100 mL/hr over 30 Minutes Intravenous 60  min pre-op 06/26/12 1424 06/27/12 1430          Assessment Principal Problem:  *Radiation colitis s/p diverting colostomy-feeling better this AM, now with output in ostomy,   --will advance diet soft  --continue ambulating  --poss home tomorrow    LOS: 2 days      Erricka Falkner 06/29/2012  \

## 2012-06-29 NOTE — Progress Notes (Signed)
Patient c/o "IV rate to high causing me to go to the bathroom too many times." Informed the patient the physicians noted she the needed fluids and he adjusts the rate according to her needs. Pt got upset and stated, I will take my IV then, if you dont lower my rate. Rate was lower to refrain the pt from pulling the IV out. This morning, pt is still complaining of rate 20 remains too high she is "continues to go to bathroom a lot." Patient is experiencing bladder urgency.

## 2012-06-29 NOTE — Progress Notes (Signed)
She is progressing well and can advance diet. May be a candidate to do colostomy "training."

## 2012-06-30 MED ORDER — OXYCODONE-ACETAMINOPHEN 5-325 MG PO TABS: 1.0000 | ORAL_TABLET | ORAL | Status: DC | PRN

## 2012-06-30 NOTE — Discharge Summary (Signed)
Agree with assessment and plan of EW,PAC. The patient says she feels great. She is anxious to go home. She feels much better than she did prior to surgery. She voices good understanding of colostomy management.

## 2012-06-30 NOTE — Discharge Summary (Signed)
Physician Discharge Summary  Patient ID: Desiree Adams MRN: [id number] DOB/AGE: Feb 24, 1946 66 y.o.  Admit date: 06/27/2012 Discharge date: 06/30/2012  Admitting Diagnosis: Radiation colitis  Discharge Diagnosis Patient Active Problem List   Diagnosis Date Noted  . Right lower lobe lung mass   . 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 Rectal ulcer 12/25/2011  . 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 VITAMIN B12 DEFICIENCY 11/11/2008  . BILIARY COLIC 99991111  . DIARRHEA, CHRONIC 11/11/2008  . NEOPLASM, MALIGNANT, OVARY, HX OF 11/11/2008  . Personal history of other diseases of digestive disease 11/11/2008    Consultants None   Procedures laparoscopic colostomy   Hospital Course:  66 yr old female who has severe radiation colitis who presented for laparoscopic colostomy.  She underwent the procedure above and tolerated this well.  Post-operatively the patient had the expected course.  Her diet was advanced as her ostomy began working.  At time of discharge, she was tolerating regular diet, ostomy working well, pain well controlled, vital signs stable and felt stable for discharge home.     Medication List     As of 06/30/2012  9:26 AM    TAKE these medications         cyanocobalamin 1000 MCG/ML injection   Commonly known as: (VITAMIN B-12)   Inject 1 ml or 1000 mcg IM once a week for 3 weeks and then once a month for 5 months      loperamide 2 MG capsule   Commonly known as: IMODIUM   Take 2 mg by mouth 2 (two) times daily.      multivitamin with minerals Tabs   Take 1 tablet by mouth daily.      oxyCODONE-acetaminophen 5-325 MG per tablet   Commonly known as: PERCOCET/ROXICET   Take 1-2 tablets by mouth every 4 (four) hours as needed.      paregoric 2 MG/5ML solution   Take 5 mLs by mouth daily as needed. For loose bowels             Follow-up Information    Call Odis Hollingshead, MD. (Call office on Monday to schedule an appt with your surgeon in 10-14 days)      Contact information:   [address] [place] [address] [phone]          Signed: Lowell Bouton [facility] [phone]  06/30/2012, 9:26 AM

## 2012-06-30 NOTE — Progress Notes (Signed)
Discharge instructions gone over with patient. Home medications gone over. Patient has demonstrated colostomy care and changing the pouch. Will make follow up appointment with surgeon in  10-14 days.  Incisional care, signs and symptoms of infection and when to call the doctor gone over with patient. She is comfortable and knowledgeable of colostomy care. Prescription given.

## 2012-07-04 ENCOUNTER — Inpatient Hospital Stay: Admit: 2012-07-04 | Payer: Self-pay | Admitting: General Surgery

## 2012-07-04 SURGERY — CREATION, COLOSTOMY, DIVERTING, LAPAROSCOPIC
Anesthesia: General

## 2012-07-09 ENCOUNTER — Telehealth (INDEPENDENT_AMBULATORY_CARE_PROVIDER_SITE_OTHER): Payer: Self-pay | Admitting: General Surgery

## 2012-07-09 NOTE — Telephone Encounter (Signed)
Crystal (home health) called in and stated she changed the colostomy for patient and that area as a red site 2cm in size on the right size of site. Stated patient having pain also. She was calling to advise. Told her the information would be forwarded to Dr. Zella Richer to advise. She confirmed call back # [phone].

## 2012-07-11 ENCOUNTER — Telehealth: Payer: Self-pay | Admitting: *Deleted

## 2012-07-11 NOTE — Telephone Encounter (Signed)
Spoke with patient and she will come for the labs.

## 2012-07-11 NOTE — Telephone Encounter (Signed)
Message copied by Hulan Saas on Thu Jul 11, 2012  8:47 AM ------      Message from: Hulan Saas      Created: Thu Dec 14, 2011  9:43 AM       Call and remind patient she is due for Vit b 12 level DB 10/14

## 2012-07-12 NOTE — Consult Note (Signed)
07/12/12 Call from patient, she is experiencing high volume of output from stoma, emptying up to 12 x day. She also has some denudation of her skin that she has been treating with pectin powder but it is not improving.  She currently has [place] and they have recommended she contact me for further recommendations. She is currently using 2pc and barrier ring but still reports leakage.  I have contacted a [place] colleague in [facility] that has an outpatient clinic, she will see Mrs. Smolinsky today to assist her with evaluation of her peristomal skin and possible recommendations for other types of pouching that may work better for the pt.  I have faxed pt demographics to [facility] wound care center where [facility] has clinic.  Pt to be seen at 1:00pm today.  I have also notified Perry at [facility] of this appointment.  Mellony Danziger Kellogg, [employer]

## 2012-07-16 ENCOUNTER — Encounter (INDEPENDENT_AMBULATORY_CARE_PROVIDER_SITE_OTHER): Payer: Medicare Other | Admitting: General Surgery

## 2012-07-23 ENCOUNTER — Encounter: Payer: Self-pay | Admitting: *Deleted

## 2012-07-23 ENCOUNTER — Encounter (INDEPENDENT_AMBULATORY_CARE_PROVIDER_SITE_OTHER): Payer: Self-pay | Admitting: General Surgery

## 2012-07-23 ENCOUNTER — Ambulatory Visit (INDEPENDENT_AMBULATORY_CARE_PROVIDER_SITE_OTHER): Payer: Medicare Other | Admitting: General Surgery

## 2012-07-23 ENCOUNTER — Other Ambulatory Visit (INDEPENDENT_AMBULATORY_CARE_PROVIDER_SITE_OTHER): Payer: Medicare Other

## 2012-07-23 VITALS — BP 118/62 | HR 58 | Temp 97.0°F | Resp 18 | Ht 64.0 in | Wt 113.8 lb

## 2012-07-23 DIAGNOSIS — E538 Deficiency of other specified B group vitamins: Secondary | ICD-10-CM

## 2012-07-23 DIAGNOSIS — Z09 Encounter for follow-up examination after completed treatment for conditions other than malignant neoplasm: Secondary | ICD-10-CM

## 2012-07-23 NOTE — Patient Instructions (Signed)
High fiber diet.  Drinking plenty of fluid-your urine should be clear.

## 2012-07-23 NOTE — Progress Notes (Signed)
Procedure:  Laparoscopic descending colostomy  Date:  06/27/2012  Pathology: na  History:  She is here for her first postoperative visit. She has been having some problems with the appliance leaking. She has some skin breakdown around the ostomy. She's been having to empty the day frequently and she says mostly it is liquid.  She is started back on the Imodium and this is helping somewhat with stool volume. She's been having a little dizziness and nausea.  Exam: General- Is in NAD.  Abdomen-soft, small incisions are clean and intact, colostomy in left abdomen is pink with liquid stool output.  Assessment:  Progressing slowly but satisfactorily post colostomy. I'm concerned she is getting dehydrated at times.  Plan:  Increase fluid. High fiber diet. Return visit one month.

## 2012-07-26 LAB — VITAMIN B12: Vitamin B-12: 718 pg/mL (ref 211–911)

## 2012-07-29 ENCOUNTER — Other Ambulatory Visit: Payer: Self-pay | Admitting: *Deleted

## 2012-07-29 DIAGNOSIS — E538 Deficiency of other specified B group vitamins: Secondary | ICD-10-CM

## 2012-08-21 ENCOUNTER — Encounter (INDEPENDENT_AMBULATORY_CARE_PROVIDER_SITE_OTHER): Payer: Self-pay | Admitting: General Surgery

## 2012-08-21 ENCOUNTER — Ambulatory Visit (INDEPENDENT_AMBULATORY_CARE_PROVIDER_SITE_OTHER): Payer: Medicare Other | Admitting: General Surgery

## 2012-08-21 VITALS — BP 114/78 | HR 76 | Temp 98.4°F | Resp 16 | Ht 64.0 in | Wt 117.2 lb

## 2012-08-21 DIAGNOSIS — Z9889 Other specified postprocedural states: Secondary | ICD-10-CM

## 2012-08-21 NOTE — Patient Instructions (Signed)
Start the exercise regimen as we discussed.

## 2012-08-21 NOTE — Progress Notes (Signed)
Procedure:  Laparoscopic descending colostomy  Date:  06/27/2012  Pathology: na  History:  She is here for her second postoperative visit. She has still been having some problems with the appliance leaking.  She still has significant stool volume.  She has gained some weight.  She is still weak at times but this is a little better.  Exam: General- Is in NAD.  Abdomen-soft, small incisions are clean and intact, colostomy in left abdomen with liquid stool output.  Assessment:  Slowly improving.  She has gained some weight.  Plan:  Return visit in 2 months. Start exercise regimen and we discussed this.  Anoscopy next visit.

## 2012-10-08 ENCOUNTER — Encounter (INDEPENDENT_AMBULATORY_CARE_PROVIDER_SITE_OTHER): Payer: Self-pay

## 2012-10-24 ENCOUNTER — Telehealth: Payer: Self-pay | Admitting: *Deleted

## 2012-10-24 NOTE — Telephone Encounter (Signed)
Left a message for patient to call me. 

## 2012-10-24 NOTE — Telephone Encounter (Signed)
Message copied by Hulan Saas on Thu Oct 24, 2012  8:39 AM ------      Message from: Hulan Saas      Created: Mon Jul 29, 2012  9:18 AM       Call and remind due for B12 level on 10/28/12 DB

## 2012-10-25 NOTE — Telephone Encounter (Signed)
Spoke with patient and she will have labs done Monday.

## 2012-10-25 NOTE — Telephone Encounter (Signed)
Left a message for patient to call me. 

## 2012-10-28 ENCOUNTER — Encounter (INDEPENDENT_AMBULATORY_CARE_PROVIDER_SITE_OTHER): Payer: Self-pay | Admitting: General Surgery

## 2012-10-28 ENCOUNTER — Ambulatory Visit (INDEPENDENT_AMBULATORY_CARE_PROVIDER_SITE_OTHER): Payer: Medicare Other | Admitting: General Surgery

## 2012-10-28 VITALS — BP 122/60 | HR 64 | Temp 97.8°F | Resp 14 | Ht 64.0 in | Wt 117.0 lb

## 2012-10-28 DIAGNOSIS — K645 Perianal venous thrombosis: Secondary | ICD-10-CM

## 2012-10-28 NOTE — Progress Notes (Signed)
Procedure:  Laparoscopic descending colostomy  Date:  06/27/2012  Pathology: na  History:  She is here for a follow up visit.  She empties the colostomy bag about 10 times per day.  She is eating better. She has a perianal mass that has been bothering her. Exam: General- Is in NAD. Abdomen-left upper quadrant colostomy is functioning well  Anorectal-dark large thrombosed right-sided hemorrhoid that bleeds easily with an angulation at the base. It is a fairly narrow base. Electrocautery was applied to stop the bleeding.  On digital rectal exam there is a very narrow and the opening and it is very uncomfortable so I did not proceed with anoscopy.  Abdomen-soft, small incisions are clean and intact, colostomy in left abdomen with liquid stool output.  Assessment: 1. Colostomy is working well. 2. External thrombosed hemorrhoid but is some dark necrotic type changes and likely will slough off given its narrow base.  Plan:  Return in one month.

## 2012-10-28 NOTE — Patient Instructions (Signed)
Call if you have heavy bleeding.

## 2012-11-05 ENCOUNTER — Encounter: Payer: Self-pay | Admitting: *Deleted

## 2012-11-19 ENCOUNTER — Telehealth: Payer: Self-pay | Admitting: *Deleted

## 2012-11-19 NOTE — Telephone Encounter (Signed)
Dr. Olevia Perches, this patient was due for B 12 level recheck in January. I called her and reminded her of the lab and sent her a letter reminding her also. She has not had the lab done.

## 2012-11-19 NOTE — Telephone Encounter (Signed)
Message copied by Hulan Saas on Tue Nov 19, 2012  8:54 AM ------      Message from: Hulan Saas      Created: Tue Nov 05, 2012 11:31 AM       Did patient have B12 level for Dr. Olevia Perches. Letter mailed. ------

## 2012-11-19 NOTE — Telephone Encounter (Signed)
Sherren Mocha, thanks for your care. Desiree Adams has not been keeping up with her B12 injections which she needs long term. When you see her next month, could your office give her B12 1000ug IM or remind her to come by for a B12 injection. THNX! Sydell Axon

## 2012-11-26 ENCOUNTER — Encounter (INDEPENDENT_AMBULATORY_CARE_PROVIDER_SITE_OTHER): Payer: Medicare Other | Admitting: General Surgery

## 2012-12-10 ENCOUNTER — Telehealth: Payer: Self-pay | Admitting: Cardiothoracic Surgery

## 2012-12-17 NOTE — Telephone Encounter (Signed)
Referred to er

## 2013-02-04 ENCOUNTER — Encounter: Payer: Medicare Other | Admitting: Thoracic Surgery (Cardiothoracic Vascular Surgery)

## 2013-03-24 ENCOUNTER — Telehealth: Payer: Self-pay | Admitting: Internal Medicine

## 2013-03-24 NOTE — Telephone Encounter (Signed)
Dr Olevia Perches, I will advise patient that b12 injectable is still on backorder. I will advise B12 1000 iu OTC at this time if that is your preference for patient. However, she was last seen 04-2013. Does she need return office visit since it has been 1 year or is she okay?

## 2013-03-24 NOTE — Telephone Encounter (Signed)
I guess you meant 04/2012 ( time flies...), but I agree with advising to take OTC B12 till we have a new supply. I need to see her independently from it, since she had a surgery by Dr Zella Richer.

## 2013-03-25 NOTE — Telephone Encounter (Signed)
Left message for patient to call back  

## 2013-03-26 NOTE — Telephone Encounter (Signed)
I have spoken to patient regarding B12 injectable medication being on national back order and that she would need to get b12 oral supplements until backorder is over. She has also scheduled an appointment with Dr Olevia Perches for 04-08-13 per Dr Nichola Sizer request.

## 2013-03-28 ENCOUNTER — Encounter: Payer: Self-pay | Admitting: *Deleted

## 2013-04-07 ENCOUNTER — Ambulatory Visit: Payer: Medicare Other | Admitting: Internal Medicine

## 2013-04-08 ENCOUNTER — Other Ambulatory Visit (INDEPENDENT_AMBULATORY_CARE_PROVIDER_SITE_OTHER): Payer: Medicare Other

## 2013-04-08 ENCOUNTER — Encounter: Payer: Self-pay | Admitting: Internal Medicine

## 2013-04-08 ENCOUNTER — Ambulatory Visit (INDEPENDENT_AMBULATORY_CARE_PROVIDER_SITE_OTHER): Payer: Medicare Other | Admitting: Internal Medicine

## 2013-04-08 VITALS — BP 98/60 | HR 80 | Ht 63.5 in | Wt 120.2 lb

## 2013-04-08 DIAGNOSIS — K520 Gastroenteritis and colitis due to radiation: Secondary | ICD-10-CM

## 2013-04-08 DIAGNOSIS — E538 Deficiency of other specified B group vitamins: Secondary | ICD-10-CM

## 2013-04-08 DIAGNOSIS — Z933 Colostomy status: Secondary | ICD-10-CM

## 2013-04-08 LAB — CBC WITH DIFFERENTIAL/PLATELET
Eosinophils Relative: 2.1 % (ref 0.0–5.0)
HCT: 32.9 % — ABNORMAL LOW (ref 36.0–46.0)
Monocytes Relative: 6 % (ref 3.0–12.0)
Neutrophils Relative %: 64.3 % (ref 43.0–77.0)
Platelets: 338 10*3/uL (ref 150.0–400.0)
WBC: 6.5 10*3/uL (ref 4.5–10.5)

## 2013-04-08 LAB — COMPREHENSIVE METABOLIC PANEL
Albumin: 3.5 g/dL (ref 3.5–5.2)
Alkaline Phosphatase: 79 U/L (ref 39–117)
BUN: 22 mg/dL (ref 6–23)
Creatinine, Ser: 1.9 mg/dL — ABNORMAL HIGH (ref 0.4–1.2)
Glucose, Bld: 75 mg/dL (ref 70–99)
Potassium: 3.6 mEq/L (ref 3.5–5.1)
Total Bilirubin: 0.7 mg/dL (ref 0.3–1.2)

## 2013-04-08 LAB — SEDIMENTATION RATE: Sed Rate: 16 mm/hr (ref 0–22)

## 2013-04-08 LAB — IBC PANEL: Iron: 88 ug/dL (ref 42–145)

## 2013-04-08 MED ORDER — CYANOCOBALAMIN 1000 MCG/ML IJ SOLN
1000.0000 ug | Freq: Once | INTRAMUSCULAR | Status: AC
  Administered 2013-04-08: 1000 ug via INTRAMUSCULAR

## 2013-04-08 MED ORDER — DIPHENOXYLATE-ATROPINE 2.5-0.025 MG PO TABS: 1.0000 | ORAL_TABLET | Freq: Every day | ORAL | Status: DC

## 2013-04-08 MED ORDER — ALPRAZOLAM 0.25 MG PO TABS: ORAL_TABLET | ORAL | Status: DC

## 2013-04-08 MED ORDER — METRONIDAZOLE 250 MG PO TABS: 250.0000 mg | ORAL_TABLET | Freq: Two times a day (BID) | ORAL | Status: DC

## 2013-04-08 NOTE — Progress Notes (Signed)
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 Zella Adams in October 2013.  She has done very well since the surgery but still has rectal pain, drainage and a large hemorrhoidal mass protruding from the rectum . She has been able to gain about 20 pounds since the surgery. 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 Prior to the colostomy, she also had a right lung mass resected which was found to be a carcinoid. She has chronic B12 deficiency and iron deficiency.   Past Medical History  Diagnosis Date  . Small bowel obstruction   . 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 History of Clostridium difficile infection   . Vitamin B12 deficiency   . Biliary colic   . PONV (postoperative nausea and vomiting)     always nausea and  vomiting  . Difficult intubation   . Right lower lobe lung mass   . Asthma     as a "baby"  . Urinary tract infection   . Ovarian cancer   . Carcinoid tumor, bronchus, lung, malignant   . Anxiety    Past Surgical History  Procedure Laterality Date  . Total abdominal hysterectomy w/ bilateral salpingoophorectomy    . Appendectomy    . Laparoscopic cholecystectomy    . Breast enhancement surgery    . Exploratory laparotomy w/ bowel resection    . Colonoscopy  12/25/2011    Procedure: COLONOSCOPY;  Surgeon: Desiree Dragon, MD;  Location: [facility];  Service: Endoscopy;  Laterality: N/A;  . Lung removal, partial    . Video assisted thoracoscopy  04/25/12    right    reports that she has never smoked. She has never used smokeless tobacco. She reports that  drinks alcohol. She reports that she does not use illicit drugs. family history  includes Bladder Cancer in her maternal grandfather and Kidney cancer in an unspecified family member.  There is no history of Colon cancer. Allergies  Allergen Reactions  . Sulfamethoxazole     REACTION: unspecified        Review of Systems: Rectal pain and bleeding. Diarrheal stools through the colostomy   The remainder of the 10 point ROS is negative except as outlined in H&P   Physical Exam: General appearance  a very thin in no distress. Eyes- non icteric. HEENT nontraumatic, normocephalic. Mouth no lesions, tongue papillated, no cheilosis. Neck supple without adenopathy, thyroid not enlarged, no carotid bruits, no JVD. Lungs Clear to auscultation bilaterally. Cor normal S1, normal S2, regular rhythm, no murmur,  quiet precordium. Abdomen: Scaphoid pain with normoactive bowel sounds and no tenderness. Colostomy in the left middle quadrant, appears healthy. There is liquid malodorous stool which is Hemoccult-positive. Rectal: Large external hemorrhoidal mass at least 3 cm in diameter which appears partly necrotic. Extremities no pedal edema. Skin no lesions. Neurological alert and oriented x 3. Psychological normal mood and affect.  Assessment and Plan:  Problem #21 67 year old white female who is post diverting colostomy for a colonic stricture. She has a history of radiation enteritis. Her quality of life has markedly improved since the colostomy has been created. She has occassional accident of leaking  colostomy about once every 2 months. Otherwise,  she has much better control of her bowl function.. We will check her 123456, CBC, metabolic panel and sedimentation rate today. We will also put her on Flagyl 250 mg twice a day for a week and I have given her samples of a probiotic to take daily. I have also advised a trial of Metamucil to thicken her stools. She will receive a B12 injection 1000 mcg today as well.   Problem #2 External hemorrhoids. She has an appointment with  Dr.Rosenbower tomorrow. The hemorrhage may have to be excised eventually.   04/08/2013 Desiree Adams

## 2013-04-08 NOTE — Patient Instructions (Addendum)
Your physician has requested that you go to the basement for the following lab work before leaving today: CBC, IBC, CMET, Sed Rate  We have sent the following medications to your pharmacy for you to pick up at your convenience: Lomotil Flagyl Xanax  We have given you a b12 injection today. Unfortunately, there is a Psychologist, prison and probation services of this medication in injectable form. We only have a very limited supply. Until it becomes readily available, please purchase b12 1000 mcg oral tablets and take once daily.  Dr Zella Richer

## 2013-04-09 ENCOUNTER — Ambulatory Visit (INDEPENDENT_AMBULATORY_CARE_PROVIDER_SITE_OTHER): Payer: Medicare Other | Admitting: General Surgery

## 2013-04-09 ENCOUNTER — Other Ambulatory Visit: Payer: Self-pay | Admitting: *Deleted

## 2013-04-09 DIAGNOSIS — K645 Perianal venous thrombosis: Secondary | ICD-10-CM

## 2013-04-09 DIAGNOSIS — R7989 Other specified abnormal findings of blood chemistry: Secondary | ICD-10-CM

## 2013-04-09 NOTE — Patient Instructions (Signed)
We will schedule your surgery.

## 2013-04-09 NOTE — Progress Notes (Signed)
Patient ID: Desiree Adams, female   DOB: 1946/03/18, 67 y.o.   MRN: [id number]  No chief complaint on file.   HPI Desiree Adams is a 67 y.o. female.   HPI  She is sent over by Dr. Olevia Perches to evaluate and external hemorrhoid.  This drains intermittently bleeds. It is quite swollen. She is doing well with her diverting colostomy. I have seen her before for a thrombosed external hemorrhoid. This seems to be the area that has not completely resolved and is bothering her.  Past Medical History  Diagnosis Date  . Small bowel obstruction   . 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 History of Clostridium difficile infection   . Vitamin B12 deficiency   . Biliary colic   . PONV (postoperative nausea and vomiting)     always nausea and  vomiting  . Difficult intubation   . Right lower lobe lung mass   . Asthma     as a "baby"  . Urinary tract infection   . Ovarian cancer   . Carcinoid tumor, bronchus, lung, malignant   . Anxiety     Past Surgical History  Procedure Laterality Date  . Total abdominal hysterectomy w/ bilateral salpingoophorectomy    . Appendectomy    . Laparoscopic cholecystectomy    . Breast enhancement surgery    . Exploratory laparotomy w/ bowel resection    . Colonoscopy  12/25/2011    Procedure: COLONOSCOPY;  Surgeon: Lafayette Dragon, MD;  Location: [facility];  Service: Endoscopy;  Laterality: N/A;  . Lung removal, partial    . Video assisted thoracoscopy  04/25/12    right    Family History  Problem Relation Age of Onset  . Kidney cancer      ???? Grandfather  . Colon cancer Neg Hx   . Bladder Cancer Maternal Grandfather     Social History History  Substance Use Topics  . Smoking status: Never Smoker   . Smokeless tobacco: Never Used  . Alcohol Use: Yes     Comment: very rarely    Allergies  Allergen Reactions  . Sulfamethoxazole     REACTION: unspecified     Current Outpatient Prescriptions  Medication Sig Dispense Refill  . ALPRAZolam (XANAX) 0.25 MG tablet Take 1/2-1 tablet by mouth at bedtime as needed.  30 tablet  1  . cyanocobalamin (,VITAMIN B-12,) 1000 MCG/ML injection Inject 1 ml or 1000 mcg IM once a week for 3 weeks and then once a month for 5 months  3 mL  5  . diphenoxylate-atropine (LOMOTIL) 2.5-0.025 MG per tablet Take 1 tablet by mouth at bedtime.  100 tablet  0  . metroNIDAZOLE (FLAGYL) 250 MG tablet Take 1 tablet (250 mg total) by mouth 2 (two) times daily.  14 tablet  0  . Multiple Vitamin (MULTIVITAMIN WITH MINERALS) TABS Take 1 tablet by mouth daily.       No current facility-administered medications for this visit.    Review of Systems Review of Systems  Constitutional: Negative.   Gastrointestinal: Positive for anal bleeding.    There were no vitals taken for this visit.  Physical Exam Physical Exam  Constitutional: No distress.  Thin female  HENT:  Head: Normocephalic and atraumatic.  Cardiovascular: Normal rate and regular rhythm.   Pulmonary/Chest: Effort normal and breath sounds normal.  Abdominal: Soft.  Left-sided colostomy that is pink. No evidence of peristomal hernia.  Genitourinary:  Large firm discolored external hemorrhoid right anterior area. Firm smaller discolored external hemorrhoid left posterior area.    Data Reviewed Dr. Nichola Sizer note.  Assessment    Persistent external hemorrhoids that are not resolving. These are somewhat symptomatic. This is in a heavily radiated area.     Plan    We talked about external hemorrhoidectomy as an outpatient procedure. We discussed the risks of this procedure. Risks include but are not limited to bleeding, infection, failure of the wounds to heal given her significant radiation to this area, and risk of anesthesia. She seems to understand all this and would like to proceed.        Desiree Adams 04/09/2013, 12:48 PM

## 2013-04-22 ENCOUNTER — Encounter: Payer: Self-pay | Admitting: *Deleted

## 2013-04-22 ENCOUNTER — Telehealth: Payer: Self-pay | Admitting: *Deleted

## 2013-04-22 NOTE — Telephone Encounter (Signed)
Message copied by Hulan Saas on Tue Apr 22, 2013  9:52 AM ------      Message from: Hulan Saas      Created: Wed Apr 09, 2013  8:32 AM       Did patient get BUN/crea, ua for DB on 04/16/13 ------

## 2013-04-22 NOTE — Telephone Encounter (Signed)
Patient has not gotten labs drawn. Letter mailed to patient.

## 2013-05-01 ENCOUNTER — Other Ambulatory Visit: Payer: Self-pay

## 2013-05-01 DIAGNOSIS — D381 Neoplasm of uncertain behavior of trachea, bronchus and lung: Secondary | ICD-10-CM

## 2013-05-08 ENCOUNTER — Telehealth: Payer: Self-pay | Admitting: *Deleted

## 2013-05-08 NOTE — Telephone Encounter (Signed)
Message copied by Hulan Saas on Thu May 08, 2013 10:40 AM ------      Message from: Hulan Saas      Created: Tue Apr 22, 2013  9:57 AM       Did patient get labs for DB ------

## 2013-05-08 NOTE — Telephone Encounter (Signed)
Dr. Olevia Perches, You wanted this patient to have UA and repeat BUN/creatinine. Patient did not come in for labs. I mailed her a letter and she still did not come in.

## 2013-05-08 NOTE — Telephone Encounter (Signed)
She is scheduled for hemorrhoidectomy by Dr Zella Richer on Sept 9, so she will have to have preop labs for him. OK to wait.

## 2013-05-13 ENCOUNTER — Ambulatory Visit (INDEPENDENT_AMBULATORY_CARE_PROVIDER_SITE_OTHER): Payer: Medicare Other | Admitting: Thoracic Surgery (Cardiothoracic Vascular Surgery)

## 2013-05-13 ENCOUNTER — Encounter: Payer: Self-pay | Admitting: Thoracic Surgery (Cardiothoracic Vascular Surgery)

## 2013-05-13 ENCOUNTER — Ambulatory Visit
Admission: RE | Admit: 2013-05-13 | Discharge: 2013-05-13 | Disposition: A | Discharge status: Home / Self Care | Payer: Medicare Other | Source: Ambulatory Visit | Attending: Thoracic Surgery (Cardiothoracic Vascular Surgery) | Admitting: Thoracic Surgery (Cardiothoracic Vascular Surgery)

## 2013-05-13 ENCOUNTER — Other Ambulatory Visit: Payer: Self-pay | Admitting: Thoracic Surgery (Cardiothoracic Vascular Surgery)

## 2013-05-13 VITALS — BP 105/67 | HR 84 | Resp 16 | Ht 63.5 in | Wt 120.0 lb

## 2013-05-13 DIAGNOSIS — Z9889 Other specified postprocedural states: Secondary | ICD-10-CM

## 2013-05-13 DIAGNOSIS — Z09 Encounter for follow-up examination after completed treatment for conditions other than malignant neoplasm: Secondary | ICD-10-CM

## 2013-05-13 DIAGNOSIS — D3A00 Benign carcinoid tumor of unspecified site: Secondary | ICD-10-CM

## 2013-05-13 DIAGNOSIS — Z902 Acquired absence of lung [part of]: Secondary | ICD-10-CM

## 2013-05-13 DIAGNOSIS — D381 Neoplasm of uncertain behavior of trachea, bronchus and lung: Secondary | ICD-10-CM

## 2013-05-13 NOTE — Progress Notes (Signed)
HPI:  Desiree Adams returns today for scheduled one year followup visit. 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 She states over the past year she has not had her usual energy. She hadn't changed out because she was having panic attacks and that helped to some degree. Her overall energy level remains low. She had a problem with cough and sensation of something being stuck in her throat for several months after her surgeries. Apparently she was a difficult intubation during both of her operations. She's not had any shortness of breath or wheezing. Her weight has been stable.  Past Medical History  Diagnosis Date  . Small bowel obstruction   . 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 History of Clostridium difficile infection   . Vitamin B12 deficiency   . Biliary colic   . PONV (postoperative nausea and vomiting)     always nausea and  vomiting  . Difficult intubation   . Right lower lobe lung mass   . Asthma     as a "baby"  . Urinary tract infection   . Ovarian cancer   . Carcinoid tumor, bronchus, lung, malignant   . Anxiety      Current Outpatient Prescriptions  Medication Sig Dispense Refill  . ALPRAZolam (XANAX) 0.25 MG tablet Take 1/2-1 tablet by mouth at bedtime as needed.  30 tablet  1  . cyanocobalamin (,VITAMIN B-12,) 1000 MCG/ML injection Inject 1 ml or 1000 mcg IM once a week for 3 weeks and then once a month for 5 months  3 mL  5  . diphenoxylate-atropine (LOMOTIL) 2.5-0.025 MG per tablet Take 1 tablet by mouth at bedtime.  100 tablet  0  . metroNIDAZOLE (FLAGYL) 250 MG tablet Take 1 tablet (250 mg total) by mouth 2 (two) times daily.  14 tablet  0  . Multiple Vitamin (MULTIVITAMIN WITH MINERALS) TABS Take 1 tablet by mouth daily.       No current facility-administered medications for this visit.    Physical Exam BP 105/67  Pulse 84  Resp 16  Ht  5' 3.5" (1.613 m)  Wt 120 lb (54.432 kg)  BMI 20.92 kg/m2  SpO63 62% 67 year old woman in no acute distress Well-developed and well-nourished No cervical or suprapubic or adenopathy Cardiac regular rate and rhythm normal S1 and S2 no murmur Lungs clear with equal breath sounds bilaterally  Diagnostic Tests: CT of chest 05/13/13 *RADIOLOGY REPORT*  Clinical Data: History of carcinoid tumor with resection of the  right lower lobe in July 2013, evaluate for recurrence  CT CHEST WITHOUT CONTRAST  Technique: Multidetector CT imaging of the chest was performed  following the standard protocol without IV contrast.  Comparison: Chest x-ray of 06/24/2012 and CT chest of 03/26/2012  Findings: Changes of right lower lobectomy are noted with scarring  at the right lung base. However, there is no evidence of  recurrence of carcinoid tumor. A small subpleural nodule within  the right upper lobe is stable. No parenchymal infiltrate or  pleural effusion is seen. The central airway is patent. Heavily  calcified breast implants are noted bilaterally.  On soft tissue window images, the thyroid gland is unremarkable.  On this unenhanced study, no mediastinal or hilar adenopathy is  seen. The ascending aorta is prominent measuring 4.2 cm, but  stable compared to the prior CT. Cardiomegaly is stable. No  hepatic lesion is seen.  Surgical clips are noted from prior  cholecystectomy. No bony abnormality is seen.  IMPRESSION:  1. Changes of right lower lobectomy with scarring at the right  lung base.  2. No evidence of recurrence of carcinoma. No adenopathy.  3. Stable fusiform prominence of the ascending aorta measuring 4.2  cm in diameter.  Original Report Authenticated By: Ivar Drape, M.D.  Impression: 67 year old woman now about a year out from having a thoracoscopic right lower lobectomy for a carcinoid tumor. She has no evidence recurrent disease. 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 There  also is a small subpleural nodule in the right upper lobe, this has been stable.  I recommended that we repeat her CT in a year as to rule out any sign of recurrence and to reevaluate her 4.2 cm ascending aorta. After that we can likely just follow the aorta with MR to avoid ionizing radiation.  She complains of a lack of energy. She was mildly anemic on blood work done a few weeks ago. This does not seem like it is low enough to be causing a dramatic effect. I recommended that we check her TSH level to make sure that she's not hypothyroid, she's agreeable to doing so.  Plan: Check TSH  Return in one year with CT of chest

## 2013-05-14 ENCOUNTER — Telehealth: Payer: Self-pay

## 2013-05-14 NOTE — Telephone Encounter (Signed)
Pt notified of normal test results.

## 2013-05-14 NOTE — Telephone Encounter (Signed)
Message copied by Marylen Ponto on Wed May 14, 2013  3:24 PM ------      Message from: Modesto Charon MD C      Created: Wed May 14, 2013 11:39 AM       Manuela Schwartz            Could you or Jolene give Ms. Bute a call and let her know her thyroid test was normal?            Thanks            [facility]      ----- Message -----         From: [facility]         Sent: 05/14/2013  12:08 AM           To: Melrose Nakayama, MD                   ------

## 2013-05-14 NOTE — Telephone Encounter (Signed)
Message copied by Marylen Ponto on Wed May 14, 2013  4:00 PM ------      Message from: Modesto Charon MD C      Created: Wed May 14, 2013 11:39 AM       Manuela Schwartz            Could you or Jolene give Ms. Terrio a call and let her know her thyroid test was normal?            Thanks            [facility]      ----- Message -----         From: [facility]         Sent: 05/14/2013  12:08 AM           To: Melrose Nakayama, MD                   ------

## 2013-05-17 IMAGING — CR DG CHEST 1V PORT
1 series · 1 of 1 positions shown · non-contrast
Comparison: Chest x-ray 04/28/2012.

CLINICAL DATA: It is evaluate chest tube placement.

PORTABLE CHEST - 1 VIEW

[AP]
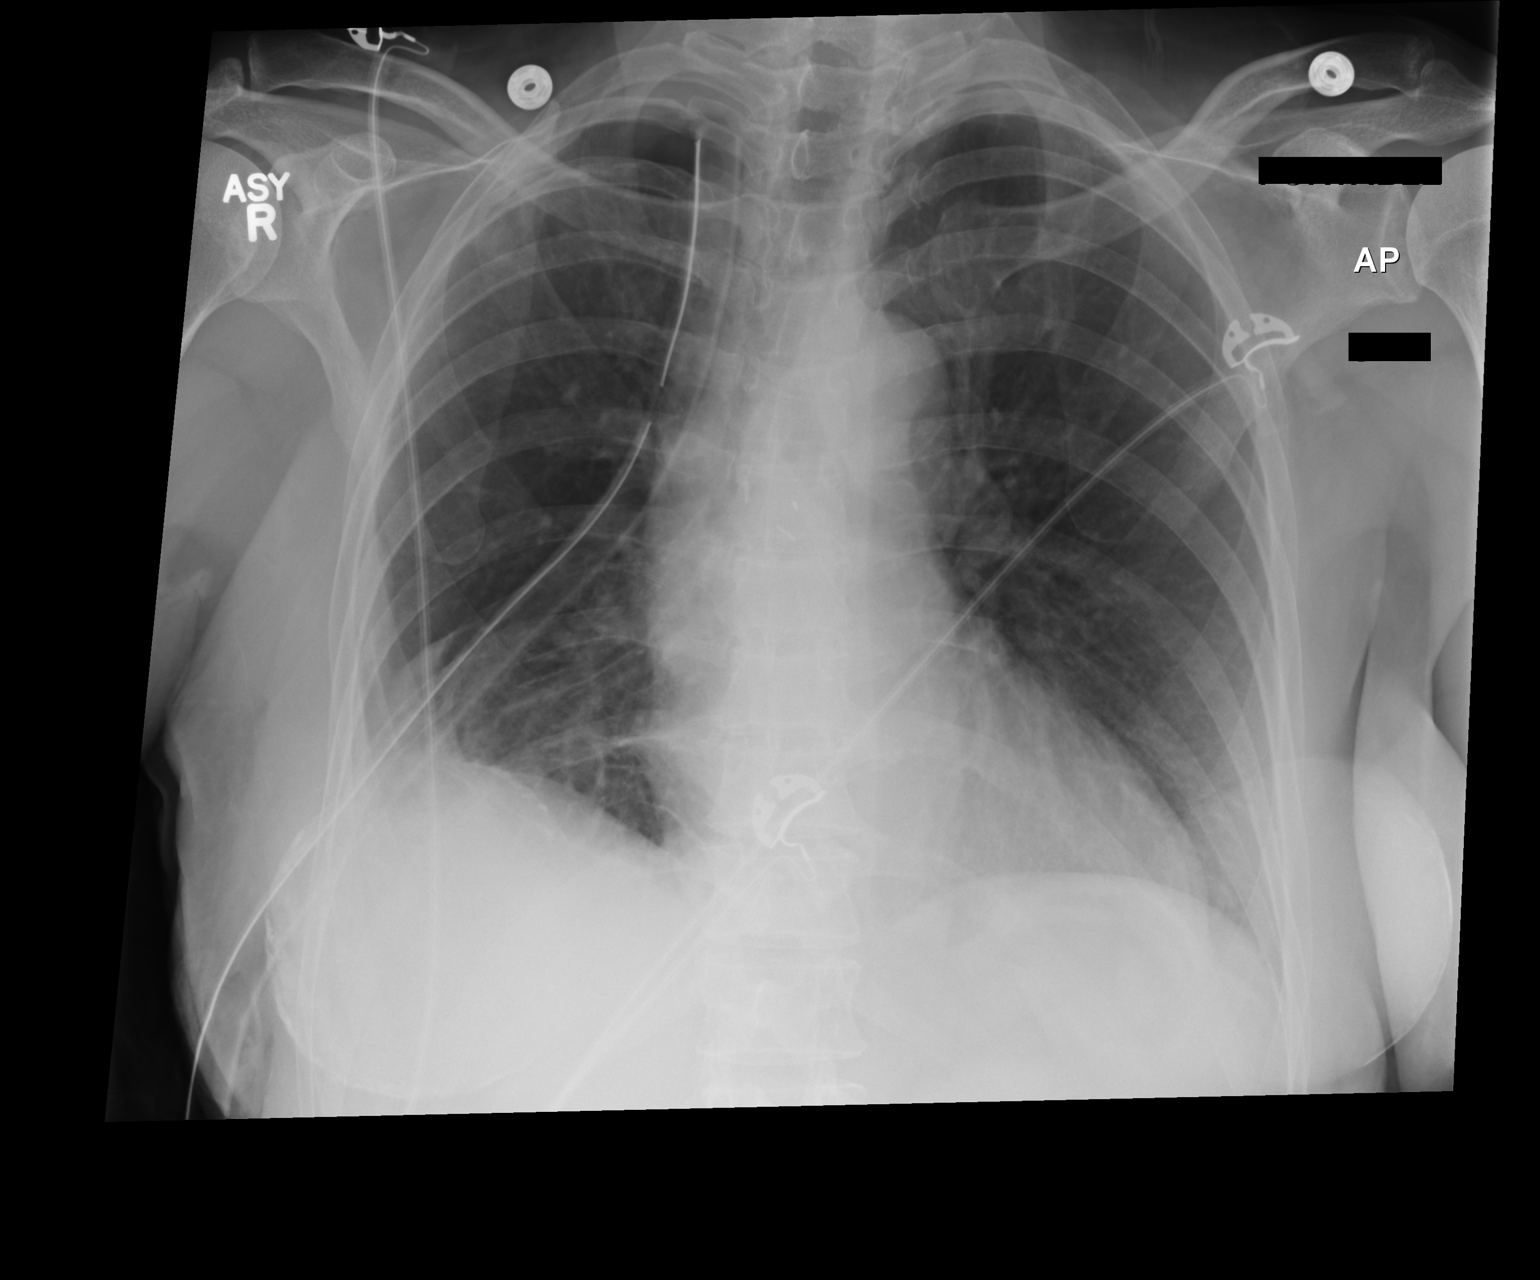

[1 of 1 positions shown; findings below may reference images not displayed]

FINDINGS: A right-sided chest tube remains in position with tip
projecting over the right apex.  Previously noted right-sided
subclavian central venous catheter has been withdrawn.  There
continues to be a small right apical pneumothorax which has
slightly decreased in size.  Blunting of the right costophrenic
sulcus is consistent with a small amount of right-sided pleural
fluid as well.  Postoperative changes of a right lower lobectomy
are again noted.  Left lung is clear.  No definite left pleural
effusion.  Pulmonary vasculature and the cardiomediastinal
silhouette are within normal limits.  Heavily calcified breast
implants are noted bilaterally.
IMPRESSION: 1.  Support apparatus, as above.
2.  Slight decrease in size of right-sided hydropneumothorax.
3.  Status post right lower lobectomy.

## 2013-05-17 IMAGING — CR DG CHEST 1V PORT
1 series · 1 of 1 positions shown · non-contrast
Comparison: [DATE]/4754 5598 hours

CLINICAL DATA: Chest tube removal

PORTABLE CHEST - 1 VIEW

[AP]
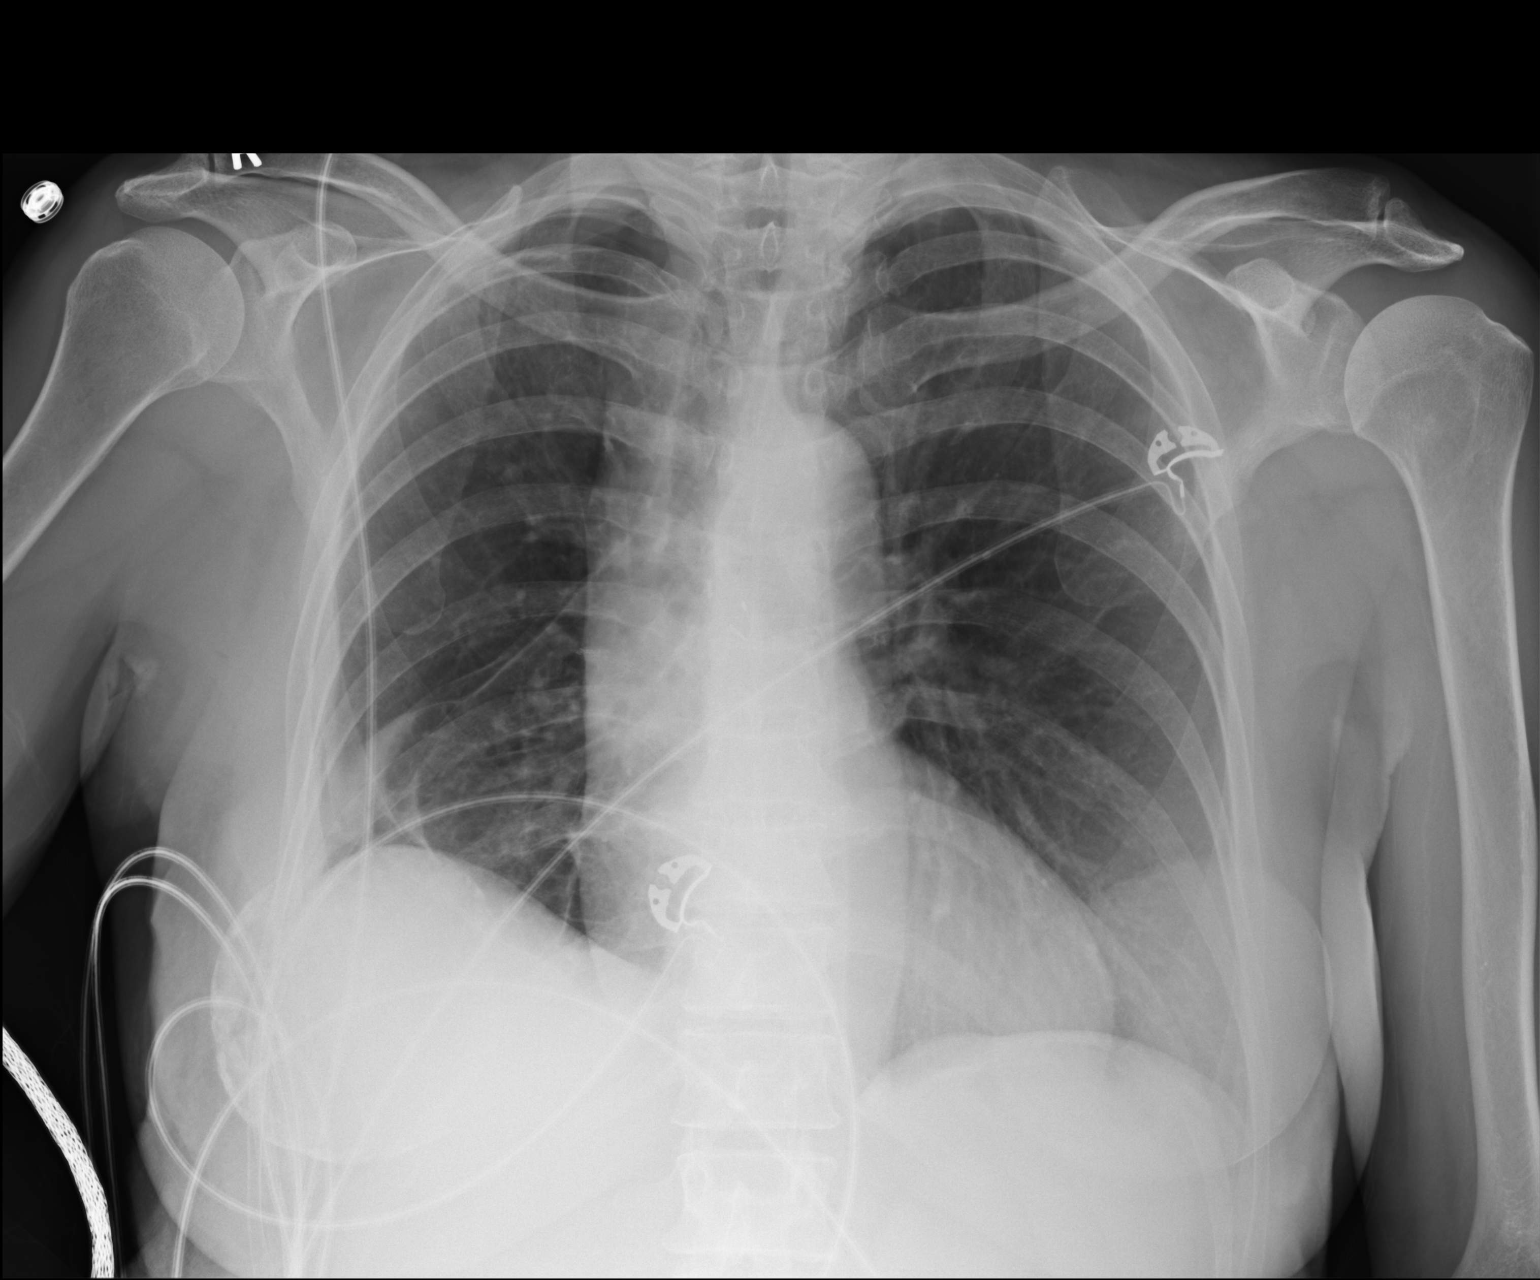

[1 of 1 positions shown; findings below may reference images not displayed]

FINDINGS: Right chest tube removed. Less than 5% right apical
pneumothorax improved.  Opacities at right base stable.  No left
pneumothorax.  Upper normal heart size.
IMPRESSION: Right chest tube removed.  Improved less than 5% right apical
pneumothorax.

## 2013-05-17 IMAGING — CR DG CHEST 1V PORT
1 series · 1 of 1 positions shown · non-contrast
Comparison: 4344 hours today

CLINICAL DATA: Chest tube to water seal

PORTABLE CHEST - 1 VIEW

[AP]
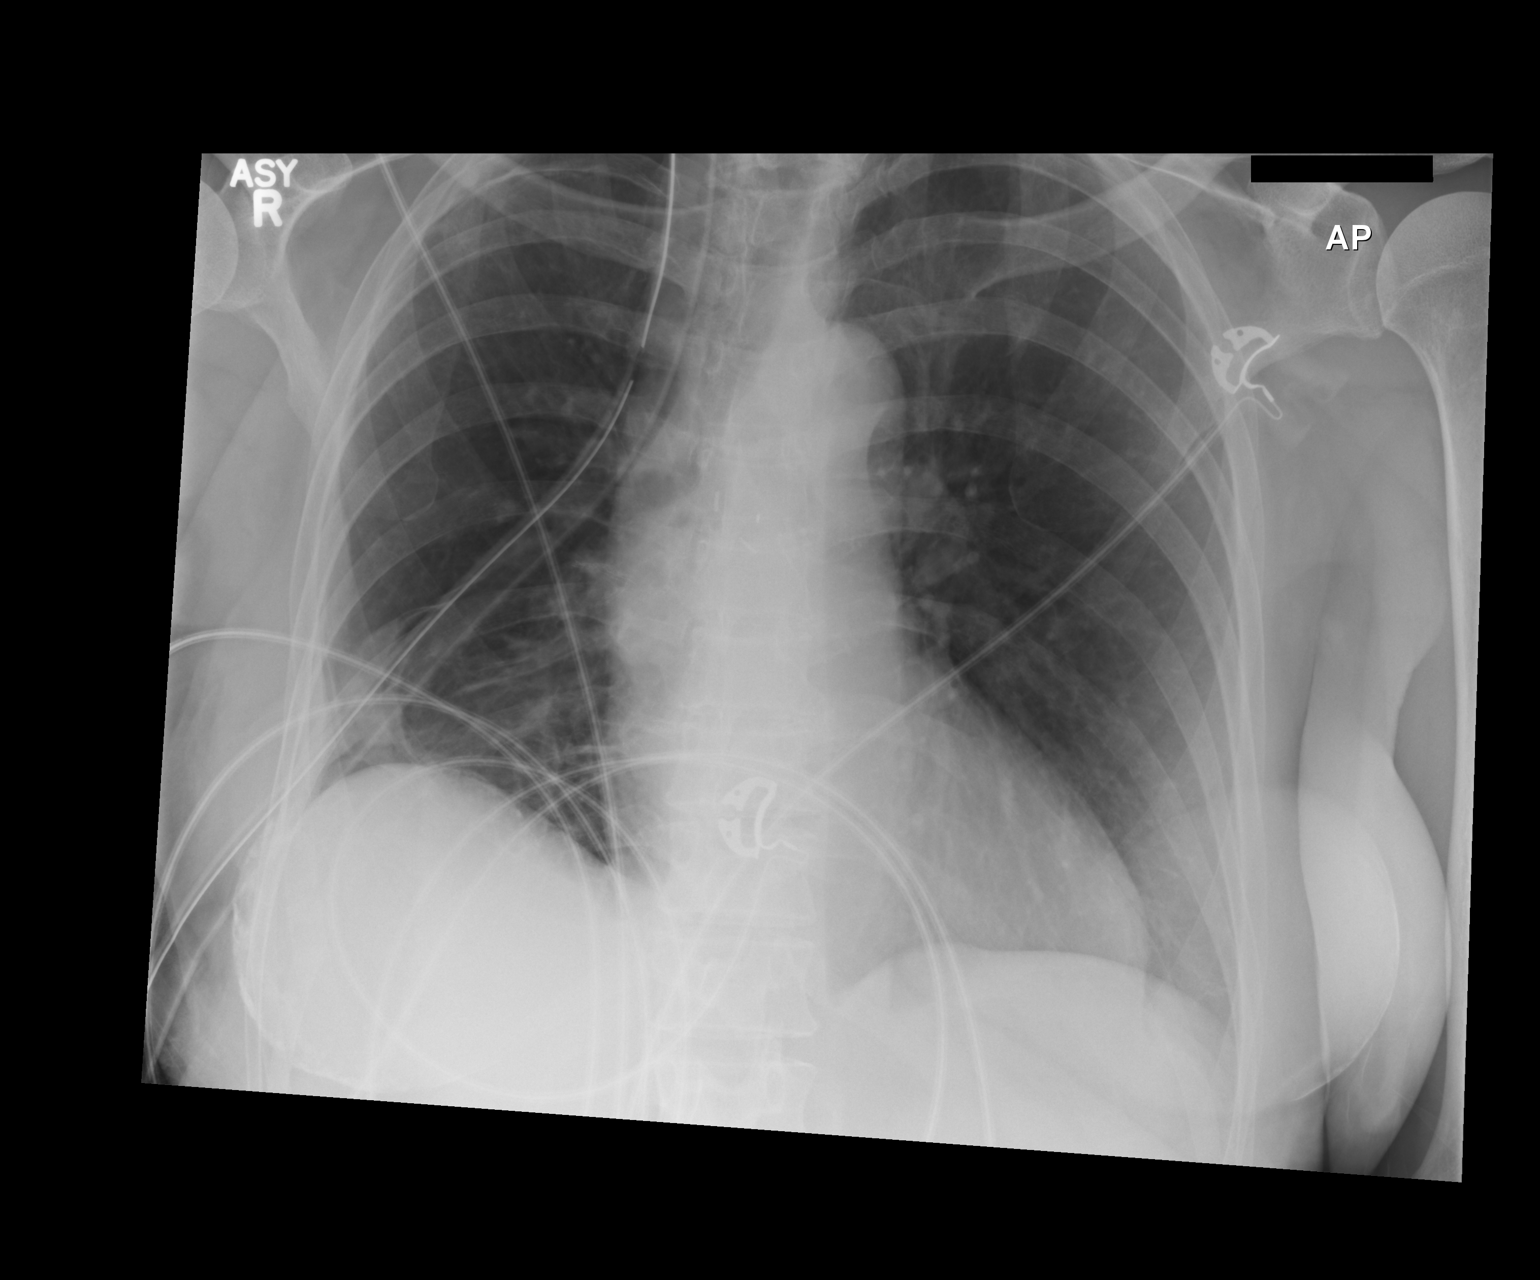

[1 of 1 positions shown; findings below may reference images not displayed]

FINDINGS: Stable right chest tube.  Stable 5% right apical
pneumothorax.  Subsegmental atelectasis at the right base is
stable.  Mild cardiomegaly is stable.  Left lung is clear.  No left
pneumothorax.
IMPRESSION: Stable 5% right pneumothorax and chest tube

## 2013-05-18 IMAGING — CR DG CHEST 2V
2 series · 2 of 2 positions shown · non-contrast
Comparison: Portable chest x-ray of 04/29/2012

CLINICAL DATA: Postop resection of right lung mass, possibly
malignant

CHEST - 2 VIEW

[w chest pa]
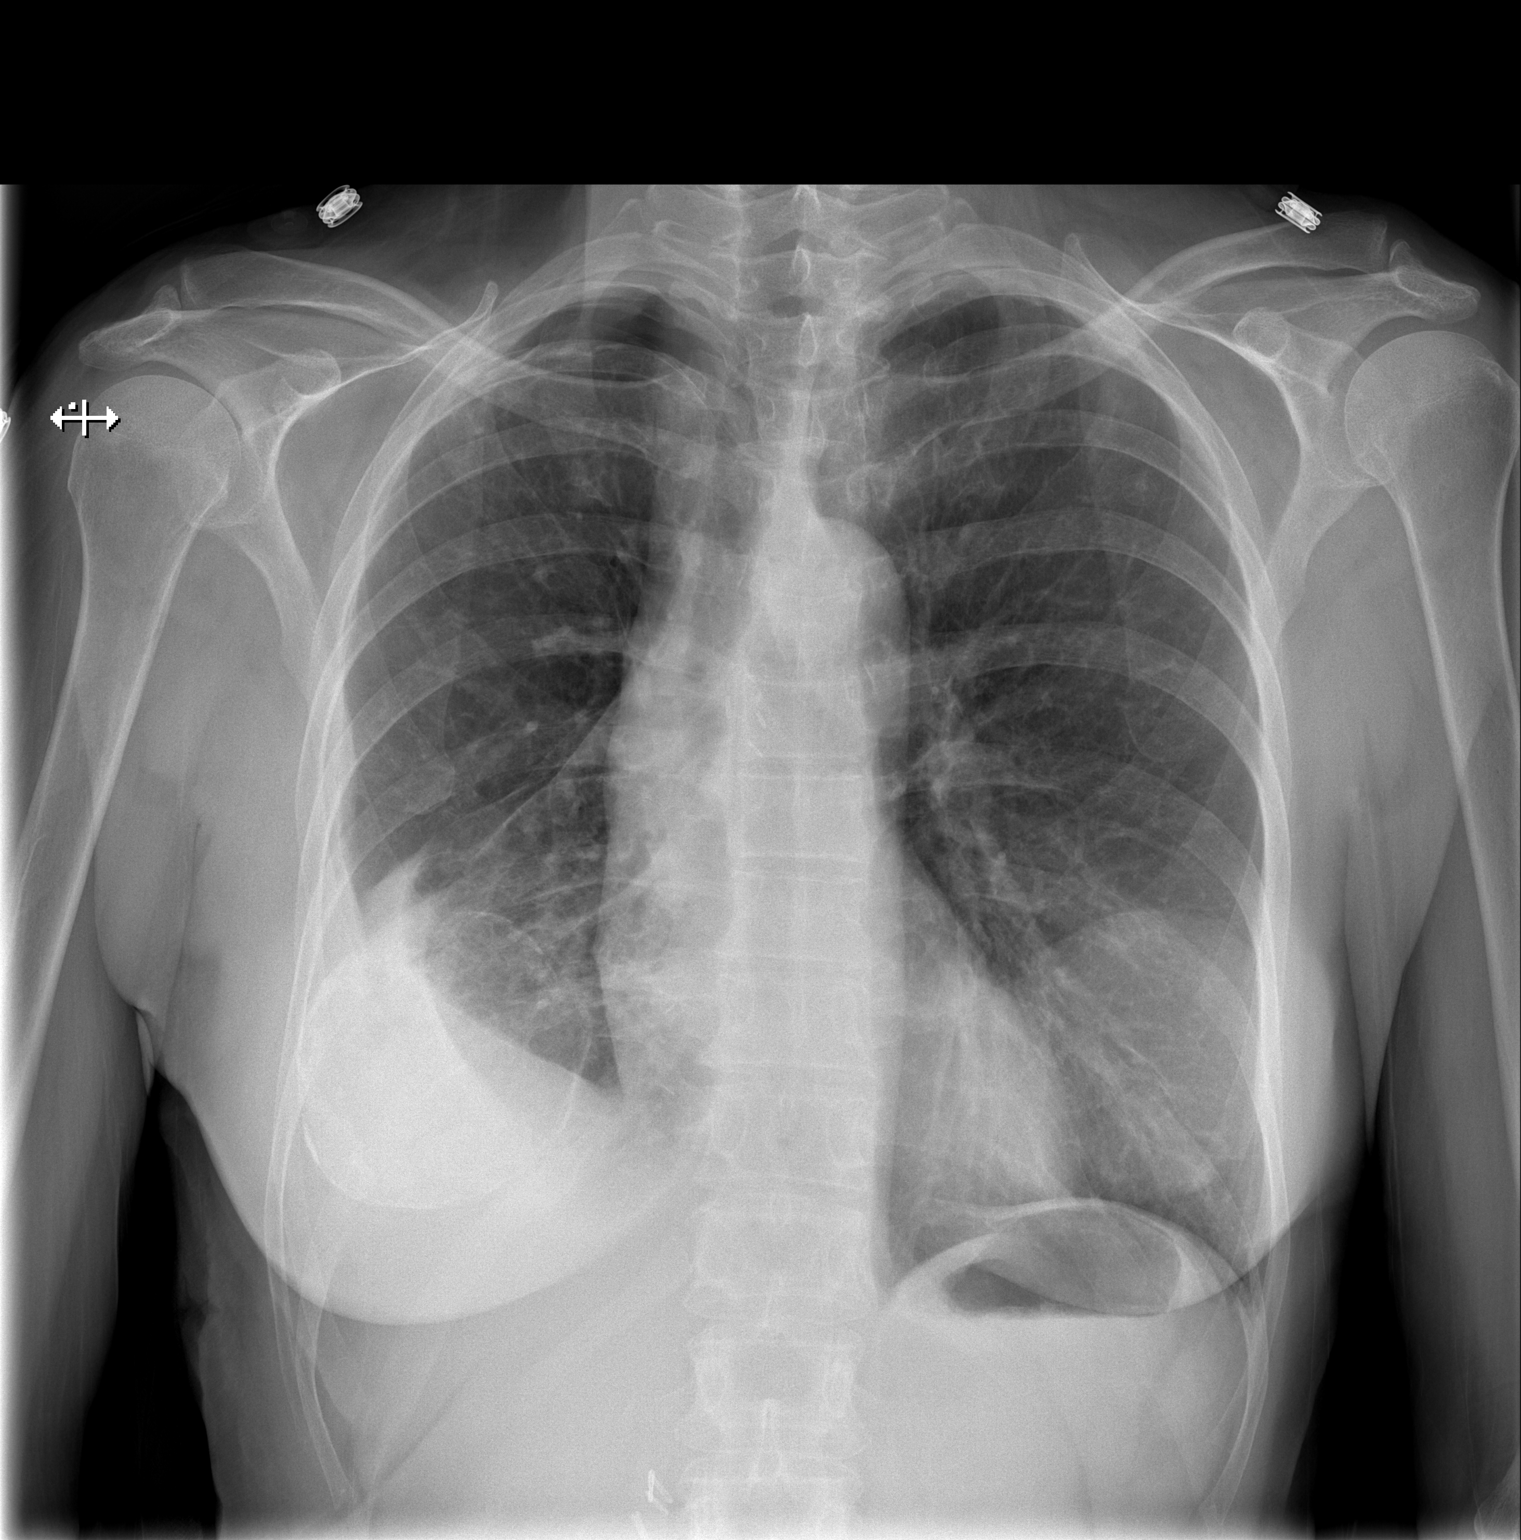

[w chest lat]
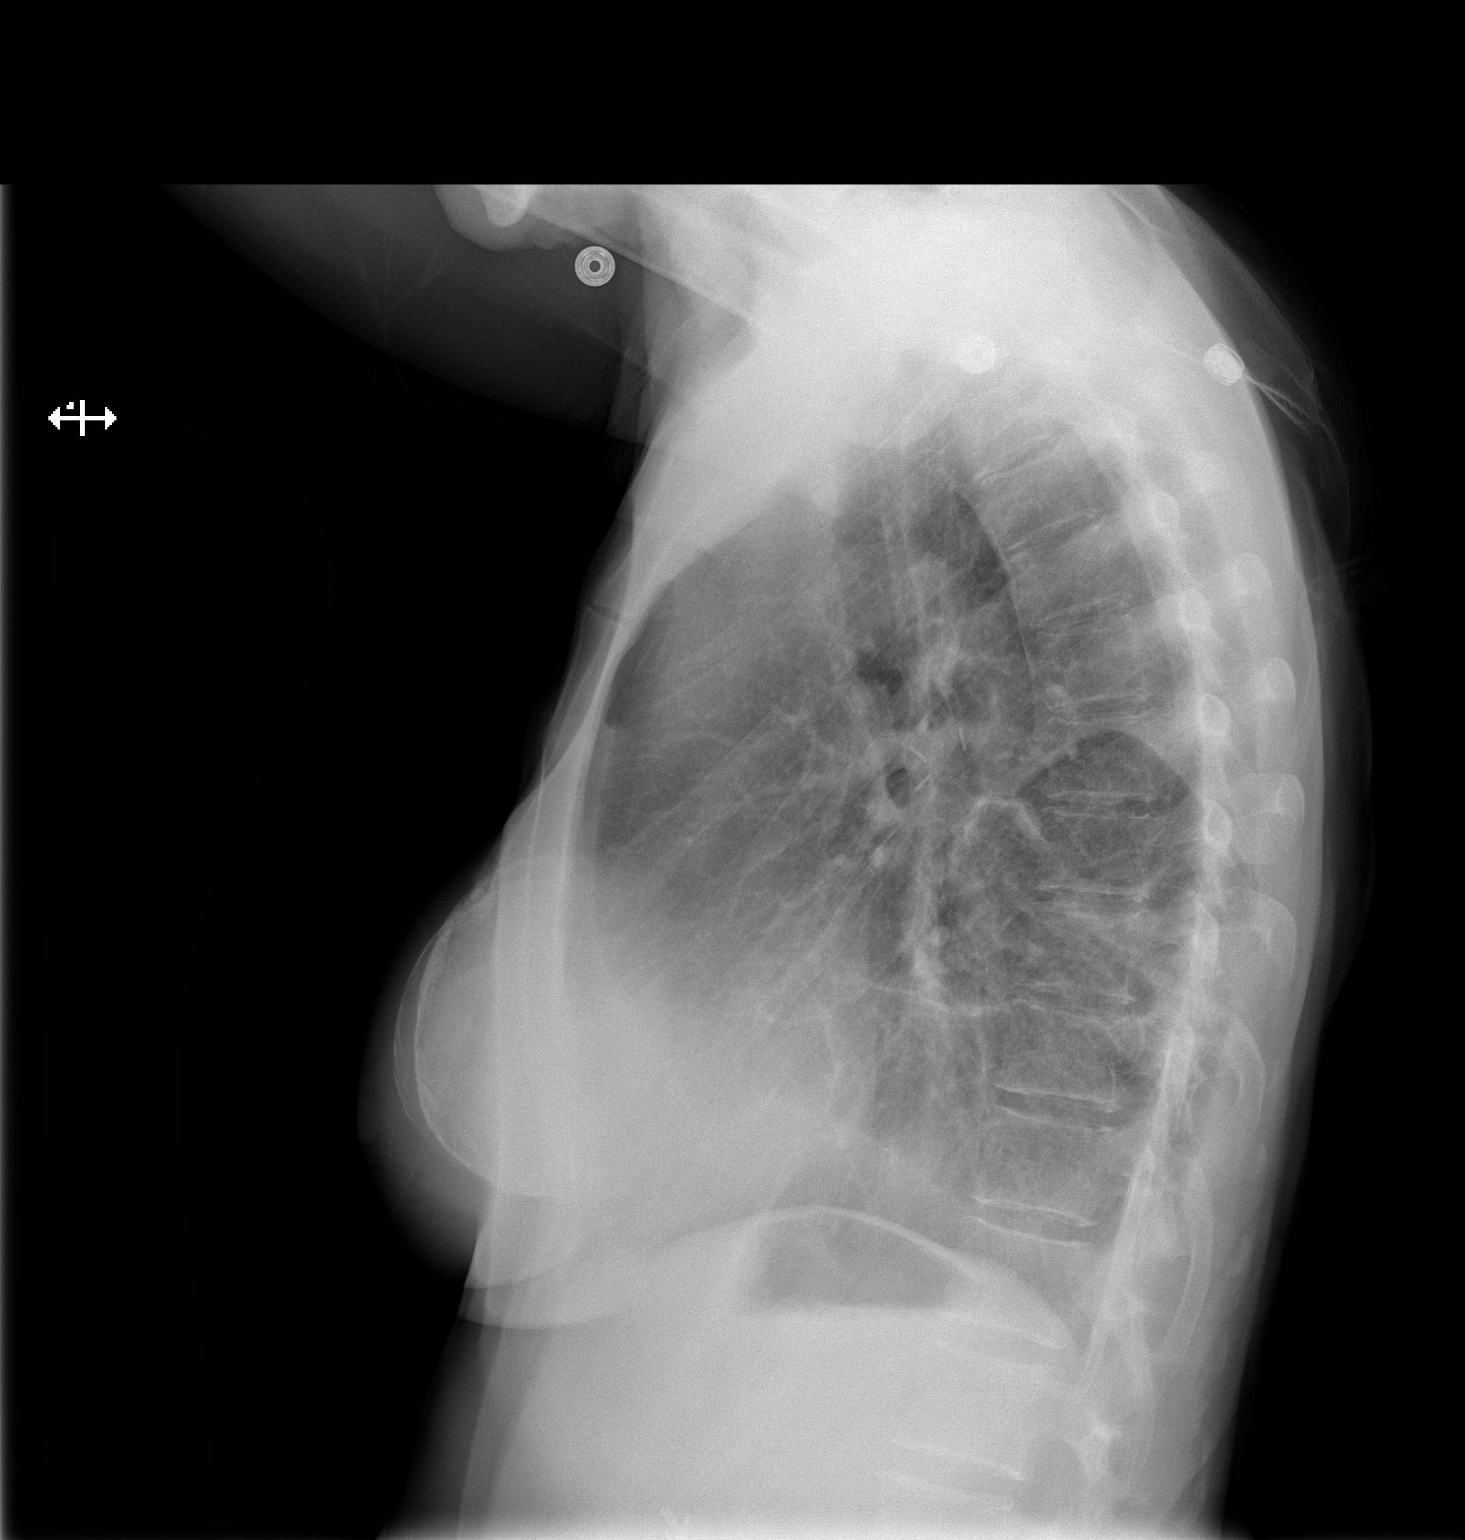

[2 of 2 positions shown; findings below may reference images not displayed]

FINDINGS: There is no change in the small right apical pleural
pneumothorax.  There is more opacity at the right lung base
consistent with right effusion and atelectasis.  Mild linear
atelectasis is noted at the left lung base.  Heart size is stable.
No bony abnormality is seen.
IMPRESSION: 1.  No change in small right apical pneumothorax.
2.  Increase in opacity at the right lung base consistent with
atelectasis and effusion.

## 2013-05-21 ENCOUNTER — Encounter (HOSPITAL_COMMUNITY): Payer: Self-pay | Admitting: Pharmacy Technician

## 2013-05-27 ENCOUNTER — Inpatient Hospital Stay (HOSPITAL_COMMUNITY): Admission: RE | Admit: 2013-05-27 | Payer: Medicare Other | Source: Ambulatory Visit

## 2013-05-29 ENCOUNTER — Encounter (HOSPITAL_COMMUNITY): Payer: Self-pay

## 2013-05-29 ENCOUNTER — Encounter (HOSPITAL_COMMUNITY)
Admission: RE | Admit: 2013-05-29 | Discharge: 2013-05-29 | Disposition: A | Discharge status: Home / Self Care | Payer: Medicare Other | Source: Ambulatory Visit | Attending: General Surgery | Admitting: General Surgery

## 2013-05-29 DIAGNOSIS — Z01812 Encounter for preprocedural laboratory examination: Secondary | ICD-10-CM | POA: Insufficient documentation

## 2013-05-29 LAB — CBC WITH DIFFERENTIAL/PLATELET
Eosinophils Relative: 2 % (ref 0–5)
HCT: 33.5 % — ABNORMAL LOW (ref 36.0–46.0)
Hemoglobin: 10.9 g/dL — ABNORMAL LOW (ref 12.0–15.0)
Lymphocytes Relative: 31 % (ref 12–46)
MCV: 89.1 fL (ref 78.0–100.0)
Monocytes Absolute: 0.4 10*3/uL (ref 0.1–1.0)
Monocytes Relative: 6 % (ref 3–12)
Neutro Abs: 4.4 10*3/uL (ref 1.7–7.7)
WBC: 7.4 10*3/uL (ref 4.0–10.5)

## 2013-05-29 LAB — COMPREHENSIVE METABOLIC PANEL
BUN: 24 mg/dL — ABNORMAL HIGH (ref 6–23)
CO2: 21 mEq/L (ref 19–32)
Calcium: 9.1 mg/dL (ref 8.4–10.5)
Chloride: 108 mEq/L (ref 96–112)
Creatinine, Ser: 1.99 mg/dL — ABNORMAL HIGH (ref 0.50–1.10)
GFR calc Af Amer: 29 mL/min — ABNORMAL LOW (ref >90)
GFR calc non Af Amer: 25 mL/min — ABNORMAL LOW (ref >90)
Glucose, Bld: 101 mg/dL — ABNORMAL HIGH (ref 70–99)
Total Bilirubin: 0.3 mg/dL (ref 0.3–1.2)

## 2013-05-29 LAB — PROTIME-INR
INR: 0.96 (ref 0.00–1.49)
Prothrombin Time: 12.6 seconds (ref 11.6–15.2)

## 2013-05-29 NOTE — Patient Instructions (Addendum)
20      Your procedure is scheduled on:  Wednesday 06-04-2013  Report to [facility] at 0600 AM.  Call this number if you have problems the night before or morning of surgery: [phone]   Remember:             IF YOU USE CPAP,BRING MASK AND TUBING AM OF SURGERY!   Do not eat food or drink liquids AFTER MIDNIGHT!  Take these medicines the morning of surgery with A SIP OF WATER: NONE   Do not bring valuables to the hospital. [place].  Marland Kitchen  Leave suitcase in the car. After surgery it may be brought to your room.  For patients admitted to the hospital, checkout time is 11:00 AM the day of              Discharge.    DO NOT WEAR JEWELRY , MAKE-UP, LOTIONS,POWDERS,PERFUMES!             WOMEN -DO NOT SHAVE LEGS OR UNDERARMS 12 HRS. BEFORE  SURGERY!               MEN MAY SHAVE AS USUAL!             CONTACTS,DENTURES OR BRIDGEWORK, FALSE EYELASHES MAY NOT BE WORN INTO SURGERY!                                           Patients discharged the day of surgery will not be allowed to drive home. If going home the same day of surgery, must have someone stay with you first 24 hrs.at home and arrange for someone to drive you home from the [place]: son -Rolena Infante or friend   Special Instructions:             Please read over the following fact sheets that you were given:             1. [place].Avon Molock,RN,BSN     [phone]                FAILURE TO FOLLOW THESE INSTRUCTIONS MAY RESULT IN  CANCELLATION OF YOUR SURGERY!               Patient Signature:___________________________

## 2013-06-04 ENCOUNTER — Ambulatory Visit (HOSPITAL_COMMUNITY): Payer: Medicare Other | Admitting: Anesthesiology

## 2013-06-04 ENCOUNTER — Ambulatory Visit (HOSPITAL_COMMUNITY)
Admission: RE | Admit: 2013-06-04 | Discharge: 2013-06-04 | Disposition: A | Discharge status: Home / Self Care | Payer: Medicare Other | Source: Ambulatory Visit | Attending: General Surgery | Admitting: General Surgery

## 2013-06-04 ENCOUNTER — Encounter (HOSPITAL_COMMUNITY)
Admission: RE | Disposition: A | Discharge status: Home / Self Care | Payer: Self-pay | Source: Ambulatory Visit | Attending: General Surgery

## 2013-06-04 ENCOUNTER — Encounter (HOSPITAL_COMMUNITY): Payer: Self-pay | Admitting: Anesthesiology

## 2013-06-04 ENCOUNTER — Encounter (HOSPITAL_COMMUNITY): Payer: Self-pay | Admitting: *Deleted

## 2013-06-04 DIAGNOSIS — Z90710 Acquired absence of both cervix and uterus: Secondary | ICD-10-CM | POA: Insufficient documentation

## 2013-06-04 DIAGNOSIS — C4359 Malignant melanoma of other part of trunk: Secondary | ICD-10-CM

## 2013-06-04 DIAGNOSIS — K624 Stenosis of anus and rectum: Secondary | ICD-10-CM | POA: Insufficient documentation

## 2013-06-04 DIAGNOSIS — K645 Perianal venous thrombosis: Secondary | ICD-10-CM | POA: Insufficient documentation

## 2013-06-04 DIAGNOSIS — Y842 Radiological procedure and radiotherapy as the cause of abnormal reaction of the patient, or of later complication, without mention of misadventure at the time of the procedure: Secondary | ICD-10-CM | POA: Insufficient documentation

## 2013-06-04 DIAGNOSIS — K644 Residual hemorrhoidal skin tags: Secondary | ICD-10-CM

## 2013-06-04 DIAGNOSIS — R079 Chest pain, unspecified: Secondary | ICD-10-CM | POA: Insufficient documentation

## 2013-06-04 DIAGNOSIS — K6289 Other specified diseases of anus and rectum: Secondary | ICD-10-CM | POA: Insufficient documentation

## 2013-06-04 DIAGNOSIS — Z85118 Personal history of other malignant neoplasm of bronchus and lung: Secondary | ICD-10-CM | POA: Insufficient documentation

## 2013-06-04 DIAGNOSIS — I440 Atrioventricular block, first degree: Secondary | ICD-10-CM | POA: Insufficient documentation

## 2013-06-04 HISTORY — PX: HEMORRHOID SURGERY: SHX153

## 2013-06-04 SURGERY — HEMORRHOIDECTOMY
Anesthesia: General | Site: Rectum | Wound class: Contaminated

## 2013-06-04 MED ORDER — CEFOXITIN SODIUM 2 G IV SOLR
2.0000 g | INTRAVENOUS | Status: AC
  Administered 2013-06-04: 2 g via INTRAVENOUS
  Filled 2013-06-04: qty 2

## 2013-06-04 MED ORDER — SCOPOLAMINE 1 MG/3DAYS TD PT72
MEDICATED_PATCH | TRANSDERMAL | Status: AC
  Filled 2013-06-04: qty 1

## 2013-06-04 MED ORDER — MEPERIDINE HCL 50 MG/ML IJ SOLN: 6.2500 mg | INTRAMUSCULAR | Status: DC | PRN

## 2013-06-04 MED ORDER — HYDROMORPHONE HCL PF 1 MG/ML IJ SOLN
INTRAMUSCULAR | Status: AC
  Filled 2013-06-04: qty 1

## 2013-06-04 MED ORDER — OXYCODONE HCL 5 MG PO TABS: 5.0000 mg | ORAL_TABLET | Freq: Once | ORAL | Status: DC | PRN

## 2013-06-04 MED ORDER — LIDOCAINE HCL (CARDIAC) 20 MG/ML IV SOLN
INTRAVENOUS | Status: DC | PRN
  Administered 2013-06-04: 100 mg via INTRAVENOUS

## 2013-06-04 MED ORDER — BUPIVACAINE LIPOSOME 1.3 % IJ SUSP
INTRAMUSCULAR | Status: DC | PRN
  Administered 2013-06-04: 20 mL

## 2013-06-04 MED ORDER — DEXAMETHASONE SODIUM PHOSPHATE 10 MG/ML IJ SOLN
INTRAMUSCULAR | Status: DC | PRN
  Administered 2013-06-04: 6 mg via INTRAVENOUS

## 2013-06-04 MED ORDER — KETAMINE HCL 10 MG/ML IJ SOLN
INTRAMUSCULAR | Status: DC | PRN
  Administered 2013-06-04 (×2): 15 mg via INTRAVENOUS

## 2013-06-04 MED ORDER — OXYCODONE HCL 5 MG PO TABS: 5.0000 mg | ORAL_TABLET | ORAL | Status: DC | PRN

## 2013-06-04 MED ORDER — PROPOFOL 10 MG/ML IV BOLUS
INTRAVENOUS | Status: DC | PRN
  Administered 2013-06-04: 140 mg via INTRAVENOUS

## 2013-06-04 MED ORDER — ONDANSETRON HCL 4 MG/2ML IJ SOLN
INTRAMUSCULAR | Status: DC | PRN
  Administered 2013-06-04: 4 mg via INTRAVENOUS

## 2013-06-04 MED ORDER — MIDAZOLAM HCL 5 MG/5ML IJ SOLN
INTRAMUSCULAR | Status: DC | PRN
  Administered 2013-06-04: 1 mg via INTRAVENOUS

## 2013-06-04 MED ORDER — EPHEDRINE SULFATE 50 MG/ML IJ SOLN
INTRAMUSCULAR | Status: DC | PRN
  Administered 2013-06-04: 5 mg via INTRAVENOUS

## 2013-06-04 MED ORDER — HYDROMORPHONE HCL PF 1 MG/ML IJ SOLN
0.2500 mg | INTRAMUSCULAR | Status: DC | PRN
  Administered 2013-06-04 (×2): 0.5 mg via INTRAVENOUS

## 2013-06-04 MED ORDER — PROMETHAZINE HCL 25 MG/ML IJ SOLN: 6.2500 mg | INTRAMUSCULAR | Status: DC | PRN

## 2013-06-04 MED ORDER — CEFOXITIN SODIUM-DEXTROSE 1-4 GM-% IV SOLR (PREMIX)
INTRAVENOUS | Status: AC
  Filled 2013-06-04: qty 100

## 2013-06-04 MED ORDER — LACTATED RINGERS IV SOLN
INTRAVENOUS | Status: DC
  Administered 2013-06-04: 1000 mL via INTRAVENOUS

## 2013-06-04 MED ORDER — SCOPOLAMINE 1 MG/3DAYS TD PT72
1.0000 | MEDICATED_PATCH | Freq: Once | TRANSDERMAL | Status: AC
  Administered 2013-06-04: 1 via TRANSDERMAL
  Administered 2013-06-04: 1.5 mg via TRANSDERMAL
  Filled 2013-06-04: qty 1

## 2013-06-04 MED ORDER — OXYCODONE HCL 5 MG/5ML PO SOLN
5.0000 mg | Freq: Once | ORAL | Status: DC | PRN
  Filled 2013-06-04: qty 5

## 2013-06-04 MED ORDER — FENTANYL CITRATE 0.05 MG/ML IJ SOLN
INTRAMUSCULAR | Status: DC | PRN
  Administered 2013-06-04 (×4): 25 ug via INTRAVENOUS

## 2013-06-04 MED ORDER — BUPIVACAINE LIPOSOME 1.3 % IJ SUSP
20.0000 mL | Freq: Once | INTRAMUSCULAR | Status: DC
  Filled 2013-06-04: qty 20

## 2013-06-04 SURGICAL SUPPLY — 34 items
BLADE HEX COATED 2.75 (ELECTRODE) ×2 IMPLANT
BLADE SURG 15 STRL LF DISP TIS (BLADE) ×1 IMPLANT
BLADE SURG 15 STRL SS (BLADE) ×2
BRIEF STRETCH FOR OB PAD LRG (UNDERPADS AND DIAPERS) ×2 IMPLANT
CANISTER SUCTION 2500CC (MISCELLANEOUS) ×2 IMPLANT
CLOTH BEACON ORANGE TIMEOUT ST (SAFETY) ×2 IMPLANT
DECANTER SPIKE VIAL GLASS SM (MISCELLANEOUS) ×1 IMPLANT
DRAPE LG THREE QUARTER DISP (DRAPES) ×2 IMPLANT
DRSG PAD ABDOMINAL 8X10 ST (GAUZE/BANDAGES/DRESSINGS) ×1 IMPLANT
ELECT REM PT RETURN 9FT ADLT (ELECTROSURGICAL) ×2
ELECTRODE REM PT RTRN 9FT ADLT (ELECTROSURGICAL) ×1 IMPLANT
GAUZE SPONGE 4X4 16PLY XRAY LF (GAUZE/BANDAGES/DRESSINGS) ×2 IMPLANT
GLOVE BIOGEL PI IND STRL 7.0 (GLOVE) ×1 IMPLANT
GLOVE BIOGEL PI INDICATOR 7.0 (GLOVE) ×1
GLOVE ECLIPSE 8.0 STRL XLNG CF (GLOVE) ×5 IMPLANT
GLOVE INDICATOR 8.0 STRL GRN (GLOVE) ×4 IMPLANT
GOWN STRL NON-REIN LRG LVL3 (GOWN DISPOSABLE) ×1 IMPLANT
GOWN STRL REIN XL XLG (GOWN DISPOSABLE) ×4 IMPLANT
HEMOSTAT SURGICEL 4X8 (HEMOSTASIS) ×1 IMPLANT
KIT BASIN OR (CUSTOM PROCEDURE TRAY) ×2 IMPLANT
LUBRICANT JELLY K Y 4OZ (MISCELLANEOUS) ×2 IMPLANT
NDL HYPO 25X1 1.5 SAFETY (NEEDLE) ×1 IMPLANT
NEEDLE HYPO 25X1 1.5 SAFETY (NEEDLE) ×2 IMPLANT
NS IRRIG 1000ML POUR BTL (IV SOLUTION) ×2 IMPLANT
PACK LITHOTOMY IV (CUSTOM PROCEDURE TRAY) ×2 IMPLANT
PENCIL BUTTON HOLSTER BLD 10FT (ELECTRODE) ×2 IMPLANT
SHEARS HARMONIC 9CM CVD (BLADE) ×1 IMPLANT
SPONGE GAUZE 4X4 12PLY (GAUZE/BANDAGES/DRESSINGS) ×1 IMPLANT
SPONGE SURGIFOAM ABS GEL 100 (HEMOSTASIS) ×2 IMPLANT
SUT CHROMIC 2 0 SH (SUTURE) IMPLANT
SUT CHROMIC 3 0 SH 27 (SUTURE) IMPLANT
SYR CONTROL 10ML LL (SYRINGE) ×2 IMPLANT
TOWEL OR 17X26 10 PK STRL BLUE (TOWEL DISPOSABLE) ×2 IMPLANT
YANKAUER SUCT BULB TIP 10FT TU (MISCELLANEOUS) ×2 IMPLANT

## 2013-06-04 NOTE — Anesthesia Postprocedure Evaluation (Signed)
Anesthesia Post Note  Patient: Desiree Adams  Procedure(s) Performed: Procedure(s) (LRB): single coulum HEMORRHOIDECTOMY (N/A)  Anesthesia type: General  Patient location: PACU  Post pain: Pain level controlled  Post assessment: Post-op Vital signs reviewed  Last Vitals: BP 130/60  Pulse 73  Temp(Src) 36.3 C (Oral)  Resp 18  SpO2 100%  Post vital signs: Reviewed  Level of consciousness: sedated  PACU course: Some chest pressure/tightness. EKG unchanged from prior. No acute MI/ST segment abnormality. Some improvement in pain with repositioning arms. Better with pain meds.  Complications: No apparent anesthesia complications

## 2013-06-04 NOTE — Transfer of Care (Signed)
Immediate Anesthesia Transfer of Care Note  Patient: Desiree Adams  Procedure(s) Performed: Procedure(s): single coulum HEMORRHOIDECTOMY (N/A)  Patient Location: PACU  Anesthesia Type:General  Level of Consciousness: awake  Airway & Oxygen Therapy: Patient Spontanous Breathing and Patient connected to face mask oxygen  Post-op Assessment: Report given to PACU RN and Post -op Vital signs reviewed and stable  Post vital signs: Reviewed and stable  Complications: No apparent anesthesia complications

## 2013-06-04 NOTE — Op Note (Signed)
Operative Note  Desiree Adams female 67 y.o. 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 The hemorrhoid has become more symptomatic and larger. She has a diverting colostomy. She now presents for the above procedure.    Procedure Detail:  She was brought to the operating room placed supine on the operating table and a general anesthetic was given. She was placed in the lithotomy position. The perianal area was sterilely prepped and draped. Digital exam under anesthesia demonstrated anal stenosis. A necrotic enlarged appearing internal and external lobulated hemorrhoid was noted.  Using the harmonic scalpel, I excised the largest portion of this to include some perianal skin. The internal portion was then excised also with the harmonic scalpel. This left a significant open wound from the 7:00 to 12:00 position. There was no way to close this wound without significantly worsening  the anal stenosis.   The wound was inspected and bleeding was controlled using electrocautery. An anal block was then performed with Exparel.  Hemostasis was adequate at this time. Gelfoam wrapped in Surgicel was then placed over the wound and a bulky dressing applied.  She tolerated the procedure well without any apparent complications and was taken to the recovery room in satisfactory condition.   Estimated Blood Loss:  less than 100 mL         Drains: none  Blood Given: none          Specimens: Right posterior hemorrhoid sent to pathology.        Complications:  * No complications entered in OR log *    Disposition: PACU - hemodynamically stable.         Condition: stable

## 2013-06-04 NOTE — Interval H&P Note (Signed)
History and Physical Interval Note:  06/04/2013 9:09 AM  Desiree Adams  has presented today for surgery, with the diagnosis of external hemorrhoidectomy  The various methods of treatment have been discussed with the patient and family. After consideration of risks, benefits and other options for treatment, the patient has consented to  Procedure(s): EXTERNAL HEMORRHOIDECTOMY (N/A) as a surgical intervention .  The patient's history has been reviewed, patient examined, no change in status, stable for surgery.  I have reviewed the patient's chart and labs.  Questions were answered to the patient's satisfaction.     Amerah Puleo Lenna Sciara

## 2013-06-04 NOTE — Preoperative (Signed)
Beta Blockers   Reason not to administer Beta Blockers:Not Applicable 

## 2013-06-04 NOTE — H&P (Signed)
Desiree Adams is an 67 y.o. female.   Chief Complaint:   Here for elective surgery HPI: She has chronically thrombosed hemorrhoids following extensive radiation therapy in the distant past.  She presents for hemorrhoidectomy.  She has a diverting colostomy due to severe radiation proctitis  Past Medical History  Diagnosis Date  . Small bowel obstruction   . 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 History of Clostridium difficile infection   . Vitamin B12 deficiency   . Biliary colic   . PONV (postoperative nausea and vomiting)     always nausea and  vomiting  . Difficult intubation   . Right lower lobe lung mass   . Asthma     as a "baby"  . Urinary tract infection   . Ovarian cancer   . Carcinoid tumor, bronchus, lung, malignant   . Anxiety     Past Surgical History  Procedure Laterality Date  . Total abdominal hysterectomy w/ bilateral salpingoophorectomy    . Appendectomy    . Laparoscopic cholecystectomy    . Breast enhancement surgery    . Exploratory laparotomy w/ bowel resection    . Colonoscopy  12/25/2011    Procedure: COLONOSCOPY;  Surgeon: Lafayette Dragon, MD;  Location: [facility];  Service: Endoscopy;  Laterality: N/A;  . Lung removal, partial    . Video assisted thoracoscopy  04/25/12    right    Family History  Problem Relation Age of Onset  . Kidney cancer      ???? Grandfather  . Colon cancer Neg Hx   . Bladder Cancer Maternal Grandfather    Social History:  reports that she has never smoked. She has never used smokeless tobacco. She reports that  drinks alcohol. She reports that she does not use illicit drugs.  Allergies:  Allergies  Allergen Reactions  . Sulfamethoxazole     REACTION: unspecified, possible headaches    Medications Prior to Admission  Medication Sig Dispense Refill  . cyanocobalamin (,VITAMIN B-12,) 1000 MCG/ML injection Inject 1,000 mcg  into the muscle every 30 (thirty) days.      . diphenoxylate-atropine (LOMOTIL) 2.5-0.025 MG per tablet Take 1 tablet by mouth at bedtime.      . ALPRAZolam (XANAX) 0.25 MG tablet Take 0.125-0.25 mg by mouth at bedtime as needed for sleep.      . Multiple Vitamin (MULTIVITAMIN WITH MINERALS) TABS tablet Take 1 tablet by mouth daily.        No results found for this or any previous visit (from the past 48 hour(s)). No results found.  Review of Systems  Constitutional: Negative for fever and chills.  Gastrointestinal: Negative for abdominal pain.    Blood pressure 108/61, pulse 73, temperature 97.5 F (36.4 C), temperature source Oral, resp. rate 18, SpO2 100.00%. Physical Exam  Constitutional: She appears well-developed and well-nourished. No distress.  Cardiovascular: Normal rate and regular rhythm.   Respiratory: Effort normal and breath sounds normal.  GI: Soft.  Left sided colostomy.  Genitourinary:  Moderated to large chronically inflamed hemorrhoids.  Neurological: She is alert.  Skin: Skin is warm and dry.     Assessment/Plan Persistent external hemorrhoids that are not resolving. These are somewhat symptomatic. This is in a heavily radiated area.  Plan :  External hemorrhoidectomy as an outpatient procedure. We discussed the risks of this procedure. Risks include but are not limited to bleeding, infection, failure of the wounds to  heal given her significant radiation to this area, and risk of anesthesia. She seems to understand all this.    Farmer Mccahill J 06/04/2013, 9:06 AM

## 2013-06-04 NOTE — Progress Notes (Signed)
Patient states she feels much better and wants to go home. No further nausea. No other chest pain since she was in PACU.  Rectal dressing became wet when patient voided. Cleaned dressing applied.

## 2013-06-04 NOTE — Anesthesia Preprocedure Evaluation (Addendum)
Anesthesia Evaluation  Patient identified by MRN, date of birth, ID band Patient awake    Reviewed: Allergy & Precautions, H&P , NPO status , Patient's Chart, lab work & pertinent test results  History of Anesthesia Complications (+) PONV and DIFFICULT AIRWAY  Airway Mallampati: II TM Distance: >3 FB Neck ROM: Full    Dental  (+) Teeth Intact and Dental Advisory Given   Pulmonary asthma ,  RLL lobectomy  in July 2013   Pulmonary exam normal       Cardiovascular Exercise Tolerance: Good - Peripheral Vascular Disease Rhythm:Regular Rate:Normal     Neuro/Psych PSYCHIATRIC DISORDERS Anxiety negative neurological ROS     GI/Hepatic Neg liver ROS, PUD, Chronic Diarrhea/ Proctotitis   Endo/Other  negative endocrine ROS  Renal/GU negative Renal ROS     Musculoskeletal negative musculoskeletal ROS (+)   Abdominal   Peds  Hematology negative hematology ROS (+)   Anesthesia Other Findings   Reproductive/Obstetrics negative OB ROS                           Anesthesia Physical  Anesthesia Plan  ASA: III  Anesthesia Plan: General   Post-op Pain Management:    Induction: Intravenous  Airway Management Planned: LMA  Additional Equipment:   Intra-op Plan:   Post-operative Plan: Extubation in OR  Informed Consent: I have reviewed the patients History and Physical, chart, labs and discussed the procedure including the risks, benefits and alternatives for the proposed anesthesia with the patient or authorized representative who has indicated his/her understanding and acceptance.   Dental advisory given  Plan Discussed with: CRNA  Anesthesia Plan Comments:        Anesthesia Quick Evaluation

## 2013-06-05 ENCOUNTER — Encounter (HOSPITAL_COMMUNITY): Payer: Self-pay | Admitting: General Surgery

## 2013-06-06 ENCOUNTER — Encounter (INDEPENDENT_AMBULATORY_CARE_PROVIDER_SITE_OTHER): Payer: Self-pay | Admitting: General Surgery

## 2013-06-06 NOTE — Progress Notes (Unsigned)
Patient ID: Desiree Adams, female   DOB: 12/12/1945, 67 y.o.   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 This was not unexpected finding. I've discussed this with her at length. She will need an oncology referral and further staging studies. I told her we would work on that on Monday.

## 2013-06-09 ENCOUNTER — Telehealth (INDEPENDENT_AMBULATORY_CARE_PROVIDER_SITE_OTHER): Payer: Self-pay

## 2013-06-09 ENCOUNTER — Other Ambulatory Visit (INDEPENDENT_AMBULATORY_CARE_PROVIDER_SITE_OTHER): Payer: Self-pay

## 2013-06-09 DIAGNOSIS — C211 Malignant neoplasm of anal canal: Secondary | ICD-10-CM

## 2013-06-09 NOTE — Telephone Encounter (Signed)
Per Dr Bertrum Sol request referral in epic and sent to General Oncology for appt asap. I spoke with Tiffany the NP coord at [facility] and she will review with Dr Benay Spice for appt asap.

## 2013-06-11 ENCOUNTER — Telehealth: Payer: Self-pay | Admitting: Internal Medicine

## 2013-06-11 ENCOUNTER — Telehealth: Payer: Self-pay | Admitting: Hematology & Oncology

## 2013-06-11 ENCOUNTER — Other Ambulatory Visit (INDEPENDENT_AMBULATORY_CARE_PROVIDER_SITE_OTHER): Payer: Self-pay | Admitting: General Surgery

## 2013-06-11 DIAGNOSIS — C4351 Malignant melanoma of anal skin: Secondary | ICD-10-CM

## 2013-06-11 NOTE — Telephone Encounter (Signed)
Dr Olevia Perches, patient wants b12 injectable sent to her pharmacy (I am still unsure if this is even available at the pharmacy again). However, per 04/08/13 office note, we told patient to take b12 1000 mcg oral supplement until b12 became available. You also indicated on her last b12 labs that she was to have repeat testing in 3 months as B12 was normal at that time. Do you want me to go ahead and send a script for patient to get b12 if available or does she need labwork first?

## 2013-06-11 NOTE — Telephone Encounter (Signed)
Pt aware of 9-22 appointment

## 2013-06-11 NOTE — Telephone Encounter (Signed)
Patient called to ask for an update.  Explained below message to patient.

## 2013-06-12 MED ORDER — CYANOCOBALAMIN 1000 MCG/ML IJ SOLN: INTRAMUSCULAR | Status: DC

## 2013-06-12 NOTE — Telephone Encounter (Signed)
Patient advised that rx has been sent to her pharmacy.

## 2013-06-12 NOTE — Telephone Encounter (Signed)
Please resend B12 injectable oreder to her Pharmacy. No labs at this time. 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

## 2013-06-17 ENCOUNTER — Telehealth: Payer: Self-pay | Admitting: Hematology & Oncology

## 2013-06-17 ENCOUNTER — Telehealth (INDEPENDENT_AMBULATORY_CARE_PROVIDER_SITE_OTHER): Payer: Self-pay

## 2013-06-17 NOTE — Telephone Encounter (Signed)
Desiree Adams at [name] called stating they need facility changed on [employer] order for Pet scan. Pt has moved her treatment to [name] and set up her Pet scan there.Scan is scheduled for 06-19-13. Pt has appt with Dr Desiree Adams at [name].  Pt cxed Pet scan in [name]. I spoke with [employer]BS case [id number]. BCBS rep states MD can change the location of Pet scan. Location changed to [name] under same order. [employer]BS states facility is out of network for pt but has been prior approved. I spoke with pt and advised her that she will be out of network having Pet scan at [name] and advised her to contact her provider to see if there will be any out of pocket cost. Pt states she understands and will contact her provider.

## 2013-06-17 NOTE — Telephone Encounter (Signed)
Pt called to cancel her new patient appt and advised her son wants her to go to [facility].

## 2013-06-18 ENCOUNTER — Ambulatory Visit (INDEPENDENT_AMBULATORY_CARE_PROVIDER_SITE_OTHER): Payer: Medicare Other | Admitting: General Surgery

## 2013-06-18 ENCOUNTER — Encounter (INDEPENDENT_AMBULATORY_CARE_PROVIDER_SITE_OTHER): Payer: Self-pay | Admitting: General Surgery

## 2013-06-18 VITALS — BP 124/80 | HR 77 | Temp 97.8°F | Resp 16 | Ht 64.0 in | Wt 121.8 lb

## 2013-06-18 DIAGNOSIS — C211 Malignant neoplasm of anal canal: Secondary | ICD-10-CM

## 2013-06-18 NOTE — Telephone Encounter (Signed)
Noted  

## 2013-06-18 NOTE — Progress Notes (Signed)
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 She and I already discussed the pathology results. I had set up outpatient PET scan. However, she decided to have her care at  [facility] system. She has an appointment there tomorrow.  She states they have a melanoma unit there. She is going to get her PET scan there.  Exam: General- Is in NAD. GU-palpable small fullness in the right groin suspicious for lymphadenopathy. Anorectal-Right-sided area demonstrates an open wound. Minimal drainage is present. No purulence or erythema.  Assessment:  Malignant melanoma of the anal canal  Plan:  Further care to be done at the [facility] system per patient preference.

## 2013-06-18 NOTE — Patient Instructions (Signed)
Call if you are having wound problems.

## 2013-06-19 ENCOUNTER — Encounter (HOSPITAL_COMMUNITY): Admission: RE | Admit: 2013-06-19 | Payer: Medicare Other | Source: Ambulatory Visit

## 2013-06-20 ENCOUNTER — Other Ambulatory Visit (HOSPITAL_COMMUNITY)
Admission: RE | Admit: 2013-06-20 | Discharge: 2013-06-20 | Disposition: A | Discharge status: Home / Self Care | Payer: Medicare Other | Source: Ambulatory Visit | Attending: Hematology & Oncology | Admitting: Hematology & Oncology

## 2013-06-20 DIAGNOSIS — C439 Malignant melanoma of skin, unspecified: Secondary | ICD-10-CM | POA: Insufficient documentation

## 2013-06-23 ENCOUNTER — Ambulatory Visit: Payer: Medicare Other

## 2013-06-23 ENCOUNTER — Ambulatory Visit: Payer: Medicare Other | Admitting: Hematology & Oncology

## 2013-06-23 ENCOUNTER — Other Ambulatory Visit: Payer: Medicare Other | Admitting: Lab

## 2013-06-27 ENCOUNTER — Ambulatory Visit: Payer: Medicare Other | Admitting: Gynecology

## 2013-07-03 ENCOUNTER — Encounter: Payer: Self-pay | Admitting: Hematology & Oncology

## 2013-11-07 ENCOUNTER — Emergency Department (HOSPITAL_BASED_OUTPATIENT_CLINIC_OR_DEPARTMENT_OTHER)
Admission: EM | Admit: 2013-11-07 | Discharge: 2013-11-07 | Disposition: A | Discharge status: Home / Self Care | Payer: Medicare HMO | Attending: Emergency Medicine | Admitting: Emergency Medicine

## 2013-11-07 ENCOUNTER — Emergency Department (HOSPITAL_BASED_OUTPATIENT_CLINIC_OR_DEPARTMENT_OTHER): Payer: Medicare HMO

## 2013-11-07 ENCOUNTER — Encounter (HOSPITAL_BASED_OUTPATIENT_CLINIC_OR_DEPARTMENT_OTHER): Payer: Self-pay | Admitting: Emergency Medicine

## 2013-11-07 DIAGNOSIS — Z85118 Personal history of other malignant neoplasm of bronchus and lung: Secondary | ICD-10-CM | POA: Insufficient documentation

## 2013-11-07 DIAGNOSIS — R109 Unspecified abdominal pain: Secondary | ICD-10-CM

## 2013-11-07 DIAGNOSIS — Z8543 Personal history of malignant neoplasm of ovary: Secondary | ICD-10-CM | POA: Insufficient documentation

## 2013-11-07 DIAGNOSIS — R141 Gas pain: Secondary | ICD-10-CM | POA: Insufficient documentation

## 2013-11-07 DIAGNOSIS — R142 Eructation: Secondary | ICD-10-CM | POA: Insufficient documentation

## 2013-11-07 DIAGNOSIS — Z9079 Acquired absence of other genital organ(s): Secondary | ICD-10-CM | POA: Insufficient documentation

## 2013-11-07 DIAGNOSIS — F411 Generalized anxiety disorder: Secondary | ICD-10-CM | POA: Insufficient documentation

## 2013-11-07 DIAGNOSIS — E538 Deficiency of other specified B group vitamins: Secondary | ICD-10-CM | POA: Insufficient documentation

## 2013-11-07 DIAGNOSIS — Z8619 Personal history of other infectious and parasitic diseases: Secondary | ICD-10-CM | POA: Insufficient documentation

## 2013-11-07 DIAGNOSIS — R1084 Generalized abdominal pain: Secondary | ICD-10-CM | POA: Insufficient documentation

## 2013-11-07 DIAGNOSIS — Z85820 Personal history of malignant melanoma of skin: Secondary | ICD-10-CM | POA: Insufficient documentation

## 2013-11-07 DIAGNOSIS — Z792 Long term (current) use of antibiotics: Secondary | ICD-10-CM | POA: Insufficient documentation

## 2013-11-07 DIAGNOSIS — J45909 Unspecified asthma, uncomplicated: Secondary | ICD-10-CM | POA: Insufficient documentation

## 2013-11-07 DIAGNOSIS — Z9089 Acquired absence of other organs: Secondary | ICD-10-CM | POA: Insufficient documentation

## 2013-11-07 DIAGNOSIS — Z8719 Personal history of other diseases of the digestive system: Secondary | ICD-10-CM | POA: Insufficient documentation

## 2013-11-07 DIAGNOSIS — R143 Flatulence: Secondary | ICD-10-CM

## 2013-11-07 DIAGNOSIS — Z87440 Personal history of urinary (tract) infections: Secondary | ICD-10-CM | POA: Insufficient documentation

## 2013-11-07 DIAGNOSIS — Z9889 Other specified postprocedural states: Secondary | ICD-10-CM | POA: Insufficient documentation

## 2013-11-07 LAB — CBC WITH DIFFERENTIAL/PLATELET
BASOS PCT: 0 % (ref 0–1)
Basophils Absolute: 0 10*3/uL (ref 0.0–0.1)
Eosinophils Absolute: 0 10*3/uL (ref 0.0–0.7)
Eosinophils Relative: 0 % (ref 0–5)
HEMATOCRIT: 35.6 % — AB (ref 36.0–46.0)
Hemoglobin: 11.9 g/dL — ABNORMAL LOW (ref 12.0–15.0)
LYMPHS ABS: 1.3 10*3/uL (ref 0.7–4.0)
Lymphocytes Relative: 12 % (ref 12–46)
MCH: 29.2 pg (ref 26.0–34.0)
MCHC: 33.4 g/dL (ref 30.0–36.0)
MCV: 87.3 fL (ref 78.0–100.0)
MONO ABS: 0.6 10*3/uL (ref 0.1–1.0)
MONOS PCT: 6 % (ref 3–12)
NEUTROS ABS: 8.6 10*3/uL — AB (ref 1.7–7.7)
NEUTROS PCT: 81 % — AB (ref 43–77)
Platelets: 379 10*3/uL (ref 150–400)
RBC: 4.08 MIL/uL (ref 3.87–5.11)
RDW: 14 % (ref 11.5–15.5)
WBC: 10.6 10*3/uL — ABNORMAL HIGH (ref 4.0–10.5)

## 2013-11-07 LAB — COMPREHENSIVE METABOLIC PANEL
ALK PHOS: 130 U/L — AB (ref 39–117)
ALT: 21 U/L (ref 0–35)
AST: 39 U/L — ABNORMAL HIGH (ref 0–37)
Albumin: 4.1 g/dL (ref 3.5–5.2)
BILIRUBIN TOTAL: 0.7 mg/dL (ref 0.3–1.2)
BUN: 26 mg/dL — AB (ref 6–23)
CHLORIDE: 93 meq/L — AB (ref 96–112)
CO2: 24 mEq/L (ref 19–32)
Calcium: 9.7 mg/dL (ref 8.4–10.5)
Creatinine, Ser: 2.2 mg/dL — ABNORMAL HIGH (ref 0.50–1.10)
GFR calc Af Amer: 26 mL/min — ABNORMAL LOW (ref >90)
GFR calc non Af Amer: 22 mL/min — ABNORMAL LOW (ref >90)
Glucose, Bld: 148 mg/dL — ABNORMAL HIGH (ref 70–99)
Potassium: 4.4 mEq/L (ref 3.7–5.3)
Sodium: 136 mEq/L — ABNORMAL LOW (ref 137–147)
Total Protein: 8.1 g/dL (ref 6.0–8.3)

## 2013-11-07 MED ORDER — ONDANSETRON HCL 4 MG PO TABS: 4.0000 mg | ORAL_TABLET | Freq: Four times a day (QID) | ORAL | Status: DC

## 2013-11-07 MED ORDER — IOHEXOL 300 MG/ML  SOLN
50.0000 mL | Freq: Once | INTRAMUSCULAR | Status: AC | PRN
  Administered 2013-11-07: 50 mL via ORAL

## 2013-11-07 MED ORDER — ONDANSETRON HCL 4 MG/2ML IJ SOLN
4.0000 mg | Freq: Once | INTRAMUSCULAR | Status: AC
  Administered 2013-11-07: 4 mg via INTRAVENOUS
  Filled 2013-11-07: qty 2

## 2013-11-07 MED ORDER — SODIUM CHLORIDE 0.9 % IV BOLUS (SEPSIS)
1000.0000 mL | Freq: Once | INTRAVENOUS | Status: AC
  Administered 2013-11-07: 1000 mL via INTRAVENOUS

## 2013-11-07 NOTE — ED Notes (Signed)
Family at bedside. 

## 2013-11-07 NOTE — ED Provider Notes (Signed)
CSN: [id number]     Arrival date & time 11/07/13  1540 History   First MD Initiated Contact with Patient 11/07/13 1551     Chief Complaint  Patient presents with  . Emesis   (Consider location/radiation/quality/duration/timing/severity/associated sxs/prior Treatment) Patient is a 68 y.o. female presenting with vomiting. The history is provided by the patient. No language interpreter was used.  Emesis Duration:  1 day Timing:  Intermittent Able to tolerate:  Liquids Context: not post-tussive and not self-induced   Associated symptoms: abdominal pain   Abdominal pain:    Location:  Generalized   Pain quality: tenderness.   Severity:  Mild Risk factors: prior abdominal surgery   Risk factors comment:  Multiple abdominal surgeries with colostomy  Pt is a 68 year old female who presents with a history of vomiting since last pm. She reports that she has had surgery for a malignant melanoma and had her rectum and part of her colon resected. She has had multiple abdominal surgeries and has a colostomy in place. She reports that she has had bowel obstructions before and they usually resolve spontaneously within 24 hours. She reports that she had significantly more tenderness in her abdomen last night than she does nowand her generalized feeling of bloating has improved. She reports that prior to her vomiting last pm she had been having watery output in her colostomy and her MD from [facility] told her to increase her fiber. She thinks that the increase in her fiber may have caused a blockage. She feels like she is starting to have some throughput with her colostomy again. She now is complaining of mild tenderness and nausea. She reports that her MD from [facility] wanted her to have her labs checked.  Past Medical History  Diagnosis Date  . Small bowel obstruction   . 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 History of  Clostridium difficile infection   . Vitamin B12 deficiency   . Biliary colic   . PONV (postoperative nausea and vomiting)     always nausea and  vomiting  . Difficult intubation   . Right lower lobe lung mass   . Asthma     as a "baby"  . Urinary tract infection   . Ovarian cancer   . Carcinoid tumor, bronchus, lung, malignant   . Anxiety    Past Surgical History  Procedure Laterality Date  . Total abdominal hysterectomy w/ bilateral salpingoophorectomy    . Appendectomy    . Laparoscopic cholecystectomy    . Breast enhancement surgery    . Exploratory laparotomy w/ bowel resection    . Colonoscopy  12/25/2011    Procedure: COLONOSCOPY;  Surgeon: Lafayette Dragon, MD;  Location: [facility];  Service: Endoscopy;  Laterality: N/A;  . Lung removal, partial    . Video assisted thoracoscopy  04/25/12    right  . Hemorrhoid surgery N/A 06/04/2013    Procedure: single coulum HEMORRHOIDECTOMY;  Surgeon: Odis Hollingshead, MD;  Location: [facility];  Service: General;  Laterality: N/A;   Family History  Problem Relation Age of Onset  . Kidney cancer      ???? Grandfather  . Colon cancer Neg Hx   . Bladder Cancer Maternal Grandfather    History  Substance Use Topics  . Smoking status: Never Smoker   . Smokeless tobacco: Never Used  . Alcohol Use: Yes     Comment: very rarely   OB  History   Grav Para Term Preterm Abortions TAB SAB Ect Mult Living                 Review of Systems  Gastrointestinal: Positive for nausea, vomiting, abdominal pain and abdominal distention.    Allergies  Sulfamethoxazole  Home Medications   Current Outpatient Rx  Name  Route  Sig  Dispense  Refill  . ciprofloxacin (CIPRO) 500 MG tablet   Oral   Take 500 mg by mouth 2 (two) times daily. For UTI         . ALPRAZolam (XANAX) 0.25 MG tablet   Oral   Take 0.125-0.25 mg by mouth at bedtime as needed for sleep.         . cyanocobalamin (,VITAMIN B-12,) 1000 MCG/ML injection      Inject 1 ml  SQ in deltoid once monthly. Pharmacy-please include 3 ml syringes and 1" 25 gauge needles.   10 mL   0   . diphenoxylate-atropine (LOMOTIL) 2.5-0.025 MG per tablet   Oral   Take 1 tablet by mouth at bedtime.         . Multiple Vitamin (MULTIVITAMIN WITH MINERALS) TABS tablet   Oral   Take 1 tablet by mouth daily.         . ondansetron (ZOFRAN) 4 MG tablet   Oral   Take 1 tablet (4 mg total) by mouth every 6 (six) hours.   12 tablet   0    BP 116/68  Pulse 98  Temp(Src) 97.8 F (36.6 C) (Oral)  Resp 18  Ht 5\' 4"  (1.626 m)  Wt 120 lb (54.432 kg)  BMI 20.59 kg/m2  SpO2 97% Physical Exam  Nursing note and vitals reviewed. Constitutional: She is oriented to person, place, and time. She appears well-developed and well-nourished. No distress.  HENT:  Head: Normocephalic and atraumatic.  Eyes: Conjunctivae are normal.  Neck: Neck supple.  Cardiovascular: Normal rate, regular rhythm, normal heart sounds and intact distal pulses.   Pulmonary/Chest: Effort normal and breath sounds normal. No respiratory distress. She has no wheezes.  Abdominal: Soft. Bowel sounds are normal. There is tenderness.  Diffuse abdominal tenderness. Left colostomy.   Musculoskeletal: Normal range of motion.  Neurological: She is alert and oriented to person, place, and time.  Skin: Skin is warm and dry.  Psychiatric: She has a normal mood and affect. Her behavior is normal. Judgment and thought content normal.    ED Course  Procedures (including critical care time) Labs Review Labs Reviewed  CBC WITH DIFFERENTIAL - Abnormal; Notable for the following:    WBC 10.6 (*)    Hemoglobin 11.9 (*)    HCT 35.6 (*)    Neutrophils Relative % 81 (*)    Neutro Abs 8.6 (*)    All other components within normal limits  COMPREHENSIVE METABOLIC PANEL - Abnormal; Notable for the following:    Sodium 136 (*)    Chloride 93 (*)    Glucose, Bld 148 (*)    BUN 26 (*)    Creatinine, Ser 2.20 (*)    AST 39  (*)    Alkaline Phosphatase 130 (*)    GFR calc non Af Amer 22 (*)    GFR calc Af Amer 26 (*)    All other components within normal limits   Imaging Review No results found.  EKG Interpretation   None       MDM   1. Abdominal pain     Pt reports  that she has had this issue before and she feels like she just ate too much fiber. She reports that she is already seeing results of movement and output of her ostomy. She reports that her abdominal pain and tenderness has gotten better. She reports general improvement with zofran and IV fluids. She reports that she has close follow-up with her MD at [facility] and has a soon appt coming. She was offered admission and observation but declined. She is in no acute distress, her VS have been stable for the duration of her stay in the ER. No evidence of perforation. CT with questionable SBO, no transition point seen. She has been given strict return precautions and agrees to plan of care. Case was discussed with Dr. Maryan Rued.     Elisha Headland, NP 11/11/13 1906

## 2013-11-07 NOTE — ED Notes (Signed)
Report received from Santiago Glad, [place], care assumed.

## 2013-11-07 NOTE — ED Notes (Signed)
Pt asking for CT scan to be performed now.  Claiborne Billings, NP informed and ok to scan early.  Adam, Elmer tech informed.

## 2013-11-07 NOTE — Discharge Instructions (Signed)
Abdominal Pain, Adult Many things can cause abdominal pain. Usually, abdominal pain is not caused by a disease and will improve without treatment. It can often be observed and treated at home. Your health care provider will do a physical exam and possibly order blood tests and X-rays to help determine the seriousness of your pain. However, in many cases, more time must pass before a clear cause of the pain can be found. Before that point, your health care provider may not know if you need more testing or further treatment. HOME CARE INSTRUCTIONS  Monitor your abdominal pain for any changes. The following actions may help to alleviate any discomfort you are experiencing:  Only take over-the-counter or prescription medicines as directed by your health care provider.  Do not take laxatives unless directed to do so by your health care provider.  Try a clear liquid diet (broth, tea, or water) as directed by your health care provider. Slowly move to a bland diet as tolerated. SEEK MEDICAL CARE IF:  You have unexplained abdominal pain.  You have abdominal pain associated with nausea or diarrhea.  You have pain when you urinate or have a bowel movement.  You experience abdominal pain that wakes you in the night.  You have abdominal pain that is worsened or improved by eating food.  You have abdominal pain that is worsened with eating fatty foods. SEEK IMMEDIATE MEDICAL CARE IF:   Your pain does not go away within 2 hours.  You have a fever.  You keep throwing up (vomiting).  Your pain is felt only in portions of the abdomen, such as the right side or the left lower portion of the abdomen.  You pass bloody or black tarry stools. MAKE SURE YOU:  Understand these instructions.   Will watch your condition.   Will get help right away if you are not doing well or get worse.  Document Released: 06/28/2005 Document Revised: 07/09/2013 Document Reviewed: 05/28/2013 [facility] Patient  Information 2014 [place]. Small Bowel Obstruction A small bowel obstruction is a blockage (obstruction) of the small intestine (small bowel). The small bowel is a long, slender tube that connects the stomach to the colon. Its job is to absorb nutrients from the fluids and foods you consume into the bloodstream.  CAUSES  There are many causes of intestinal blockage. The most common ones include:  Hernias. This is a more common cause in children than adults.  Inflammatory bowel disease (enteritis and colitis).  Twisting of the bowel (volvulus).  Tumors.  Scar tissue (adhesions) from previous surgery or radiation treatment.  Recent surgery. This may cause an acute small bowel obstruction called an ileus. SYMPTOMS   Abdominal pain. This may be dull cramps or sharp pain. It may occur in one area or may be present in the entire abdomen. Pain can range from mild to severe, depending on the degree of obstruction.  Nausea and vomiting. Vomit may be greenish or yellow bile color.  Distended or swollen stomach. Abdominal bloating is a common symptom.  Constipation.  Lack of passing gas.  Frequent belching.  Diarrhea. This may occur if runny stool is able to leak around the obstruction. DIAGNOSIS  Your caregiver can usually diagnose small bowel obstruction by taking a history, doing a physical exam, and taking X-rays. If the cause is unclear, a CT scan (computerized tomography) of your abdomen and pelvis may be needed. TREATMENT  Treatment of the blockage depends on the cause and how bad the problem is.  Sometimes, the obstruction improves with bed rest and intravenous (IV) fluids.  Resting the bowel is very important. This means following a simple diet. Sometimes, a clear liquid diet may be required for several days.  Sometimes, a small tube (nasogastric tube) is placed into the stomach to decompress the bowel. When the bowel is blocked, it usually swells up like a balloon  filled with air and fluids. Decompression means that the air and fluids are removed by suction through that tube. This can help with pain, discomfort, and nausea. It can also help the obstruction resolve faster.  Surgery may be required if other treatments do not work. Bowel obstruction from a hernia may require early surgery and can be an emergency procedure. Adhesions that cause frequent or severe obstructions may also require surgery. HOME CARE INSTRUCTIONS If your bowel obstruction is only partial or incomplete, you may be allowed to go home.  Get plenty of rest.  Follow your diet as directed by your caregiver.  Only consume clear liquids until your condition improves.  Avoid solid foods as instructed. SEEK IMMEDIATE MEDICAL CARE IF:  You have increased pain or cramping.  You vomit blood.  You have uncontrolled vomiting or nausea.  You cannot drink fluids due to vomiting or pain.  You develop confusion.  You begin feeling very dry or thirsty (dehydrated).  You have severe bloating.  You have chills.  You have a fever.  You feel extremely weak or you faint. MAKE SURE YOU:  Understand these instructions.  Will watch your condition.  Will get help right away if you are not doing well or get worse. Document Released: 12/05/2005 Document Revised: 12/11/2011 Document Reviewed: 12/02/2010 [facility] Patient Information 2014 [place].   Return if symptoms worsen Follow-up with MD at [place] as needed for nausea

## 2013-11-07 NOTE — ED Notes (Signed)
PO contrast provided

## 2013-11-07 NOTE — ED Notes (Signed)
Vomiting started last night.  Sts she "passed out".  Cramping of hands and legs.  Came to Ed at suggestion of her MD at [facility] for labs.  Has Colostomy with hx of bowel obstructions.

## 2013-11-07 NOTE — ED Notes (Signed)
MD at bedside. 

## 2013-11-07 NOTE — ED Notes (Signed)
Pt returned from CT °

## 2013-11-11 NOTE — ED Provider Notes (Signed)
Medical screening examination/treatment/procedure(s) were conducted as a shared visit with non-physician practitioner(s) and myself.  I personally evaluated the patient during the encounter. Pt presents w/ ab pain, n/v d/a, and complicated PMHx inc mult ab surgeries & ostomy. Her primary MD is an [facility] and she has upcoming appt.  She felt improved after IVF, zofran. CT showing questionable SBO.  Have advised admission, but pt states she has had multiple similar episodes which has resolved spontaneously and she would like ot be d/c.  I believe she understands the risk sof living including worsening or life threatening SBO. Pt & spouse agree to return immediately if she worsens.   EKG Interpretation   None         Neta Ehlers, MD 11/11/13 1919

## 2014-01-08 ENCOUNTER — Other Ambulatory Visit (HOSPITAL_BASED_OUTPATIENT_CLINIC_OR_DEPARTMENT_OTHER): Payer: Self-pay | Admitting: Podiatry

## 2014-01-08 DIAGNOSIS — R52 Pain, unspecified: Secondary | ICD-10-CM

## 2014-01-10 ENCOUNTER — Ambulatory Visit (HOSPITAL_BASED_OUTPATIENT_CLINIC_OR_DEPARTMENT_OTHER): Payer: Medicare HMO

## 2014-04-21 ENCOUNTER — Other Ambulatory Visit: Payer: Self-pay | Admitting: *Deleted

## 2014-04-21 DIAGNOSIS — R222 Localized swelling, mass and lump, trunk: Secondary | ICD-10-CM

## 2014-05-26 ENCOUNTER — Other Ambulatory Visit: Payer: Medicare Other

## 2014-05-26 ENCOUNTER — Ambulatory Visit: Payer: Medicare Other | Admitting: Thoracic Surgery (Cardiothoracic Vascular Surgery)

## 2014-06-02 ENCOUNTER — Ambulatory Visit: Payer: Medicare Other | Admitting: Thoracic Surgery (Cardiothoracic Vascular Surgery)

## 2014-06-02 ENCOUNTER — Inpatient Hospital Stay: Admission: RE | Admit: 2014-06-02 | Payer: Medicare Other | Source: Ambulatory Visit

## 2014-06-02 ENCOUNTER — Ambulatory Visit: Payer: Medicare HMO | Admitting: Thoracic Surgery (Cardiothoracic Vascular Surgery)

## 2014-06-16 ENCOUNTER — Encounter: Payer: Self-pay | Admitting: Thoracic Surgery (Cardiothoracic Vascular Surgery)

## 2014-06-16 ENCOUNTER — Ambulatory Visit: Payer: Medicare Other | Admitting: Thoracic Surgery (Cardiothoracic Vascular Surgery)

## 2014-06-16 ENCOUNTER — Ambulatory Visit (INDEPENDENT_AMBULATORY_CARE_PROVIDER_SITE_OTHER): Payer: Medicare HMO | Admitting: Thoracic Surgery (Cardiothoracic Vascular Surgery)

## 2014-06-16 VITALS — BP 114/76 | HR 90 | Ht 67.0 in | Wt 122.0 lb

## 2014-06-16 DIAGNOSIS — Z9889 Other specified postprocedural states: Secondary | ICD-10-CM

## 2014-06-16 DIAGNOSIS — Z902 Acquired absence of lung [part of]: Secondary | ICD-10-CM

## 2014-06-16 NOTE — Progress Notes (Signed)
  HPI:  [name] returns today for a scheduled followup visit.  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  I last saw her in the office in August of 2014 at which time she was doing well with no evidence of recurrent disease.  In the interim since her last visit she had surgery for what was felt to be a thrombosed hemorrhoid, but turned out to be malignant melanoma. She is currently being followed by oncology at [facility]. She had a PET/CT in June.  She says that she is feeling well. She is not having any trouble with her breathing. She has no residual pain from the surgery.  Past Medical History  Diagnosis Date  . Small bowel obstruction   . 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 History of Clostridium difficile infection   . Vitamin B12 deficiency   . Biliary colic   . PONV (postoperative nausea and vomiting)     always nausea and  vomiting  . Difficult intubation   . Right lower lobe lung mass   . Asthma     as a "baby"  . Urinary tract infection   . Ovarian cancer   . Carcinoid tumor, bronchus, lung, malignant   . Anxiety        Current Outpatient Prescriptions  Medication Sig Dispense Refill  . ALPRAZolam (XANAX) 0.25 MG tablet Take 0.125-0.25 mg by mouth at bedtime as needed for sleep.      . cyanocobalamin (,VITAMIN B-12,) 1000 MCG/ML injection Inject 1 ml SQ in deltoid once monthly. Pharmacy-please include 3 ml syringes and 1" 25 gauge needles.  10 mL  0  . diphenoxylate-atropine (LOMOTIL) 2.5-0.025 MG per tablet Take 1 tablet by mouth at bedtime.      . Multiple Vitamin (MULTIVITAMIN WITH MINERALS) TABS tablet Take 1 tablet by mouth daily.      . ondansetron (ZOFRAN) 4 MG tablet Take 1 tablet (4 mg total) by mouth every 6 (six) hours.  12 tablet  0   No current facility-administered medications for this visit.    Physical  Exam BP 114/76  Pulse 90  Ht 5\' 7"  (1.702 m)  Wt 122 lb (55.339 kg)  BMI 19.10 kg/m2  SpO2 94% Well-appearing 68 year old woman in no acute distress Alert and oriented x3 No cervical or suprapubic or adenopathy Lungs diminished in right base, otherwise clear Cardiac regular rate and rhythm normal S1 and S2  Diagnostic Tests:  PET/CT from [facility] reviewed. Report has been requested.  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 She has no evidence recurrent disease.  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 We'll try to see her after her scans are done there to assess whether there is anything suspicious in regards to the lungs.  Plan:  Return in 6 months

## 2014-12-10 ENCOUNTER — Other Ambulatory Visit: Payer: Self-pay | Admitting: Thoracic Surgery (Cardiothoracic Vascular Surgery)

## 2014-12-10 DIAGNOSIS — D1430 Benign neoplasm of unspecified bronchus and lung: Secondary | ICD-10-CM

## 2014-12-15 ENCOUNTER — Ambulatory Visit (INDEPENDENT_AMBULATORY_CARE_PROVIDER_SITE_OTHER): Payer: Medicare HMO | Admitting: Thoracic Surgery (Cardiothoracic Vascular Surgery)

## 2014-12-15 ENCOUNTER — Encounter: Payer: Self-pay | Admitting: Thoracic Surgery (Cardiothoracic Vascular Surgery)

## 2014-12-15 ENCOUNTER — Ambulatory Visit
Admission: RE | Admit: 2014-12-15 | Discharge: 2014-12-15 | Disposition: A | Discharge status: Home / Self Care | Payer: Medicare HMO | Source: Ambulatory Visit | Attending: Thoracic Surgery (Cardiothoracic Vascular Surgery) | Admitting: Thoracic Surgery (Cardiothoracic Vascular Surgery)

## 2014-12-15 VITALS — BP 116/63 | HR 86 | Resp 20 | Ht 64.0 in | Wt 125.0 lb

## 2014-12-15 DIAGNOSIS — D1430 Benign neoplasm of unspecified bronchus and lung: Secondary | ICD-10-CM

## 2014-12-15 DIAGNOSIS — Z9889 Other specified postprocedural states: Secondary | ICD-10-CM

## 2014-12-15 DIAGNOSIS — Z902 Acquired absence of lung [part of]: Secondary | ICD-10-CM

## 2014-12-15 DIAGNOSIS — D3A00 Benign carcinoid tumor of unspecified site: Secondary | ICD-10-CM

## 2014-12-15 NOTE — Progress Notes (Signed)
      [address]       [place] [address]             [phone]       HPI:  Desiree Adams returns today for scheduled 6 month follow-up visit.  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  I last saw her in September 2015. 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  She says she's been doing well. She has had a persistent cough and a sensation of a tickle in her throat since her lobectomy 2-1/2 years ago. She says that her surgery at [facility] also aggravated this cough. It is nonproductive. She does not have any noticeable sinus drainage or reflux symptoms.  She is currently being treated for urinary tract infection.  Past Medical History  Diagnosis Date  . Small bowel obstruction   . 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 History of Clostridium difficile infection   . Vitamin B12 deficiency   . Biliary colic   . PONV (postoperative nausea and vomiting)     always nausea and  vomiting  . Difficult intubation   . Right lower lobe lung mass   . Asthma     as a "baby"  . Urinary tract infection   . Ovarian cancer   . Carcinoid tumor, bronchus, lung, malignant   . Anxiety     Current Outpatient Prescriptions  Medication Sig Dispense Refill  . ALPRAZolam (XANAX) 0.25 MG tablet Take 0.125-0.25 mg by mouth at bedtime as needed for sleep.    Marland Kitchen amoxicillin-clavulanate (AUGMENTIN) 875-125 MG per tablet Take 1 tablet by mouth 2 (two) times daily.     . cyanocobalamin (,VITAMIN B-12,) 1000 MCG/ML injection Inject 1 ml SQ in deltoid once monthly. Pharmacy-please include 3 ml syringes and 1" 25 gauge needles. 10 mL 0  . Multiple Vitamin (MULTIVITAMIN WITH MINERALS) TABS tablet Take 1 tablet by mouth daily.     No current facility-administered medications for this visit.    Physical Exam BP 116/63 mmHg  Pulse 86  Resp 20  Ht  5\' 4"  (1.626 m)  Wt 125 lb (56.7 kg)  BMI 21.45 kg/m2  SpO15 63% 69 year old woman in no acute distress Well-developed and well-nourished Alert and oriented 3 with no focal deficits No cervical or supra clavicular adenopathy Cardiac regular rate and rhythm normal S1 and S2 Lungs clear with very mildly diminished breath sounds the right base  Diagnostic Tests: Chest x-ray shows postoperative changes. There is no active disease.  Impression: 69 year old woman who is now 2-1/2 years out from lobectomy for a bronchial carcinoid. She has no evidence of recurrent disease. She does need to be followed out to 5 years.  She also is being followed for melanoma at [facility]. They will be doing PET scans for that on a semiannual to a basis. Therefore I have not ordering any additional scans. But I have asked Desiree Adams to bring a copy of her latest PET when she comes to see me so that I can evaluated as well.  We briefly discussed referral to a otorhinolaryngologist for evaluation of her persistent cough. It is not bothering her severely at the present time.  Plan: Return in 6 months- after PET at [facility], MD [facility] [phone]

## 2015-06-22 ENCOUNTER — Ambulatory Visit: Payer: Medicare HMO | Admitting: Thoracic Surgery (Cardiothoracic Vascular Surgery)

## 2015-08-06 ENCOUNTER — Telehealth: Payer: Self-pay | Admitting: *Deleted

## 2015-08-10 ENCOUNTER — Ambulatory Visit: Payer: Medicare HMO | Admitting: Thoracic Surgery (Cardiothoracic Vascular Surgery)

## 2015-09-07 ENCOUNTER — Emergency Department (HOSPITAL_BASED_OUTPATIENT_CLINIC_OR_DEPARTMENT_OTHER): Payer: Medicare HMO

## 2015-09-07 ENCOUNTER — Emergency Department (HOSPITAL_BASED_OUTPATIENT_CLINIC_OR_DEPARTMENT_OTHER)
Admission: EM | Admit: 2015-09-07 | Discharge: 2015-09-07 | Disposition: A | Discharge status: Home / Self Care | Payer: Medicare HMO | Attending: Emergency Medicine | Admitting: Emergency Medicine

## 2015-09-07 ENCOUNTER — Encounter (HOSPITAL_BASED_OUTPATIENT_CLINIC_OR_DEPARTMENT_OTHER): Payer: Self-pay | Admitting: *Deleted

## 2015-09-07 DIAGNOSIS — R1110 Vomiting, unspecified: Secondary | ICD-10-CM | POA: Diagnosis not present

## 2015-09-07 DIAGNOSIS — Z8543 Personal history of malignant neoplasm of ovary: Secondary | ICD-10-CM | POA: Insufficient documentation

## 2015-09-07 DIAGNOSIS — F419 Anxiety disorder, unspecified: Secondary | ICD-10-CM | POA: Diagnosis not present

## 2015-09-07 DIAGNOSIS — Z85118 Personal history of other malignant neoplasm of bronchus and lung: Secondary | ICD-10-CM | POA: Diagnosis not present

## 2015-09-07 DIAGNOSIS — Z90710 Acquired absence of both cervix and uterus: Secondary | ICD-10-CM | POA: Diagnosis not present

## 2015-09-07 DIAGNOSIS — R531 Weakness: Secondary | ICD-10-CM | POA: Insufficient documentation

## 2015-09-07 DIAGNOSIS — E538 Deficiency of other specified B group vitamins: Secondary | ICD-10-CM | POA: Diagnosis not present

## 2015-09-07 DIAGNOSIS — R109 Unspecified abdominal pain: Secondary | ICD-10-CM | POA: Insufficient documentation

## 2015-09-07 DIAGNOSIS — J45909 Unspecified asthma, uncomplicated: Secondary | ICD-10-CM | POA: Diagnosis not present

## 2015-09-07 DIAGNOSIS — Z9049 Acquired absence of other specified parts of digestive tract: Secondary | ICD-10-CM | POA: Insufficient documentation

## 2015-09-07 DIAGNOSIS — Z933 Colostomy status: Secondary | ICD-10-CM | POA: Insufficient documentation

## 2015-09-07 DIAGNOSIS — N189 Chronic kidney disease, unspecified: Secondary | ICD-10-CM | POA: Insufficient documentation

## 2015-09-07 DIAGNOSIS — Z87440 Personal history of urinary (tract) infections: Secondary | ICD-10-CM | POA: Insufficient documentation

## 2015-09-07 DIAGNOSIS — Z8619 Personal history of other infectious and parasitic diseases: Secondary | ICD-10-CM | POA: Insufficient documentation

## 2015-09-07 DIAGNOSIS — Z8719 Personal history of other diseases of the digestive system: Secondary | ICD-10-CM | POA: Diagnosis not present

## 2015-09-07 DIAGNOSIS — Z792 Long term (current) use of antibiotics: Secondary | ICD-10-CM | POA: Diagnosis not present

## 2015-09-07 DIAGNOSIS — Z79899 Other long term (current) drug therapy: Secondary | ICD-10-CM | POA: Insufficient documentation

## 2015-09-07 DIAGNOSIS — R52 Pain, unspecified: Secondary | ICD-10-CM

## 2015-09-07 LAB — COMPREHENSIVE METABOLIC PANEL
ALT: 90 U/L — ABNORMAL HIGH (ref 14–54)
AST: 66 U/L — AB (ref 15–41)
Albumin: 4.6 g/dL (ref 3.5–5.0)
Alkaline Phosphatase: 139 U/L — ABNORMAL HIGH (ref 38–126)
Anion gap: 14 (ref 5–15)
BUN: 35 mg/dL — AB (ref 6–20)
CHLORIDE: 101 mmol/L (ref 101–111)
CO2: 20 mmol/L — ABNORMAL LOW (ref 22–32)
Calcium: 9.8 mg/dL (ref 8.9–10.3)
Creatinine, Ser: 2.8 mg/dL — ABNORMAL HIGH (ref 0.44–1.00)
GFR calc Af Amer: 19 mL/min — ABNORMAL LOW (ref >60)
GFR calc non Af Amer: 16 mL/min — ABNORMAL LOW (ref >60)
Glucose, Bld: 177 mg/dL — ABNORMAL HIGH (ref 65–99)
Potassium: 3.8 mmol/L (ref 3.5–5.1)
Sodium: 135 mmol/L (ref 135–145)
Total Bilirubin: 1.1 mg/dL (ref 0.3–1.2)
Total Protein: 8.8 g/dL — ABNORMAL HIGH (ref 6.5–8.1)

## 2015-09-07 LAB — CBC
HCT: 44 % (ref 36.0–46.0)
HEMOGLOBIN: 14.7 g/dL (ref 12.0–15.0)
MCH: 29.6 pg (ref 26.0–34.0)
MCHC: 33.4 g/dL (ref 30.0–36.0)
MCV: 88.7 fL (ref 78.0–100.0)
Platelets: 377 10*3/uL (ref 150–400)
RBC: 4.96 MIL/uL (ref 3.87–5.11)
RDW: 13.8 % (ref 11.5–15.5)
WBC: 8.2 10*3/uL (ref 4.0–10.5)

## 2015-09-07 LAB — LIPASE, BLOOD: LIPASE: 42 U/L (ref 11–51)

## 2015-09-07 MED ORDER — METOCLOPRAMIDE HCL 10 MG PO TABS: 10.0000 mg | ORAL_TABLET | Freq: Three times a day (TID) | ORAL | Status: DC | PRN

## 2015-09-07 MED ORDER — SODIUM CHLORIDE 0.9 % IV BOLUS (SEPSIS)
2000.0000 mL | Freq: Once | INTRAVENOUS | Status: AC
  Administered 2015-09-07: 2000 mL via INTRAVENOUS

## 2015-09-07 NOTE — ED Provider Notes (Signed)
CSN: [id number]     Arrival date & time 09/07/15  1734 History  By signing my name below, I, Soijett Blue, attest that this documentation has been prepared under the direction and in the presence of Orlie Dakin, MD. Electronically Signed: Soijett Blue, ED Scribe. 09/07/2015. 6:13 PM.   Chief Complaint  Patient presents with  . Weakness     The history is provided by the patient. No language interpreter was used.    HOLDEN HUESTON is a 69 y.o. female with a medical hx of SBO, gastroenteritis/colitis, who presents to the Emergency Department complaining of vomiting x 2 days. She notes that she she has a PMHx of Ovarian CA at age 79 and has had issues with bowel obstruction requiring surgery and the addition of a colostomy bag. She states that 2 days ago colostomy bag did not have usual amount of output. She did have a hard bowel movement in her colostomy bag today. She also complains of multiple episodes of vomiting today and feels dehydrated other associated symptoms include crampy abdominal pain though no pain at present. She denies any fever denies urinary symptoms no treatment prior to coming here. Her abdomen does not feel or look distended to her. She notes that when the blockage occurs and she is seen for her symptoms she is given a bag of potassium and electrolytes to alleviate her symptoms.  Pt is having associated symptoms of weakness and intermittent with ambulation abdominal cramping. She notes that she has tried Pedialyte with no medications for the relief of her symptoms. She denies dysuria, and any other symptoms. She denies smoking cigarettes, and is a social drinker. Pt is allergic to sulfa abx.   Past Medical History  Diagnosis Date  . Small bowel obstruction ([place])   . 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 History of Clostridium difficile infection   . Vitamin B12 deficiency   .  Biliary colic   . PONV (postoperative nausea and vomiting)     always nausea and  vomiting  . Difficult intubation   . Right lower lobe lung mass   . Asthma     as a "baby"  . Urinary tract infection   . Ovarian cancer ([place])   . Carcinoid tumor, bronchus, lung, malignant ([place])   . Anxiety    Past Surgical History  Procedure Laterality Date  . Total abdominal hysterectomy w/ bilateral salpingoophorectomy    . Appendectomy    . Laparoscopic cholecystectomy    . Breast enhancement surgery    . Exploratory laparotomy w/ bowel resection    . Colonoscopy  12/25/2011    Procedure: COLONOSCOPY;  Surgeon: Lafayette Dragon, MD;  Location: [facility];  Service: Endoscopy;  Laterality: N/A;  . Lung removal, partial    . Video assisted thoracoscopy  04/25/12    right  . Hemorrhoid surgery N/A 06/04/2013    Procedure: single coulum HEMORRHOIDECTOMY;  Surgeon: Odis Hollingshead, MD;  Location: [facility] ORS;  Service: General;  Laterality: N/A;   Family History  Problem Relation Age of Onset  . Kidney cancer      ???? Grandfather  . Colon cancer Neg Hx   . Bladder Cancer Maternal Grandfather    Social History  Substance Use Topics  . Smoking status: Never Smoker   . Smokeless tobacco: Never Used  . Alcohol Use: Yes     Comment: very rarely   OB History  No data available     Review of Systems  HENT: Negative.   Respiratory: Negative.   Cardiovascular: Negative.   Gastrointestinal: Positive for nausea, vomiting and abdominal pain (abdominal cramping).       No nausea present  Genitourinary: Negative for dysuria.  Musculoskeletal: Negative.   Skin: Negative.   Neurological: Positive for weakness.  Psychiatric/Behavioral: Negative.       Allergies  Sulfamethoxazole  Home Medications   Prior to Admission medications   Medication Sig Start Date End Date Taking? Authorizing Provider  ALPRAZolam (XANAX) 0.25 MG tablet Take 0.125-0.25 mg by mouth at bedtime as needed for sleep.     Historical Provider, MD  amoxicillin-clavulanate (AUGMENTIN) 875-125 MG per tablet Take 1 tablet by mouth 2 (two) times daily.  12/10/14   Historical Provider, MD  cyanocobalamin (,VITAMIN B-12,) 1000 MCG/ML injection Inject 1 ml SQ in deltoid once monthly. Pharmacy-please include 3 ml syringes and 1" 25 gauge needles. 06/12/13   Lafayette Dragon, MD  Multiple Vitamin (MULTIVITAMIN WITH MINERALS) TABS tablet Take 1 tablet by mouth daily.    Historical Provider, MD   BP 84/61 mmHg  Pulse 109  Temp(Src) 98 F (36.7 C) (Oral)  Resp 20  Ht 5\' 4"  (1.626 m)  Wt 125 lb (56.7 kg)  BMI 21.45 kg/m2  SpO2 94% Physical Exam  Constitutional: She appears well-developed and well-nourished. No distress.  HENT:  Head: Normocephalic and atraumatic.  mucusmembranes dry  Eyes: Conjunctivae are normal. Pupils are equal, round, and reactive to light.  Neck: Neck supple. No tracheal deviation present. No thyromegaly present.  Cardiovascular: Normal rate and regular rhythm.   No murmur heard. Pulmonary/Chest: Effort normal and breath sounds normal.  Abdominal: Soft. Bowel sounds are normal. She exhibits no distension. There is no tenderness.  Colostomy in place  Musculoskeletal: Normal range of motion. She exhibits no edema or tenderness.  Neurological: She is alert. Coordination normal.  Skin: Skin is warm and dry. No rash noted.  Psychiatric: She has a normal mood and affect.  Nursing note and vitals reviewed.   ED Course  Procedures (including critical care time) DIAGNOSTIC STUDIES: Oxygen Saturation is 94% on RA, adequate by my interpretation.    COORDINATION OF CARE: 6:13 PM Discussed treatment plan with pt at bedside which includes CBC and the pt agreed to plan.    Labs Review Labs Reviewed  COMPREHENSIVE METABOLIC PANEL - Abnormal; Notable for the following:    CO2 20 (*)    Glucose, Bld 177 (*)    BUN 35 (*)    Creatinine, Ser 2.80 (*)    Total Protein 8.8 (*)    AST 66 (*)    ALT 90  (*)    Alkaline Phosphatase 139 (*)    GFR calc non Af Amer 16 (*)    GFR calc Af Amer 19 (*)    All other components within normal limits  LIPASE, BLOOD  CBC    Imaging Review Ct Abdomen Pelvis Wo Contrast  09/07/2015  CLINICAL DATA:  Acute onset of vomiting, generalized abdominal cramping and weakness. Initial encounter. EXAM: CT ABDOMEN AND PELVIS WITHOUT CONTRAST TECHNIQUE: Multidetector CT imaging of the abdomen and pelvis was performed following the standard protocol without IV contrast. COMPARISON:  CT of the abdomen and pelvis from 11/07/2013 FINDINGS: Minimal bibasilar atelectasis is noted. Asymmetric nodularity within the right breast is grossly stable from 2015 and likely reflect normal fibroglandular tissue. Bilateral breast implants are noted, with calcification more prominent on the  right. The liver and spleen are unremarkable in appearance. The patient is status post cholecystectomy, with clips noted at the gallbladder fossa. The pancreas and adrenal glands are unremarkable. The kidneys are unremarkable in appearance. There is no evidence of hydronephrosis. No renal or ureteral stones are seen. No perinephric stranding is appreciated. No free fluid is identified. The small bowel is largely filled with fluid and is grossly unremarkable in appearance. The stomach is within normal limits. No acute vascular abnormalities are seen. The patient is status post appendectomy, with multiple right-sided bowel suture lines noted. Scattered diverticulosis is noted along the remaining ascending colon. The patient's left lower quadrant colostomy is unremarkable in appearance. The patient is status post resection of the sigmoid colon and rectum. Postoperative presacral stranding and nodularity has improved. Underlying dense calcification is seen, similar in appearance to the prior study. The bladder is mildly distended and grossly unremarkable. The patient is status post hysterectomy. No suspicious adnexal  masses are seen. No inguinal lymphadenopathy is seen. No acute osseous abnormalities are identified. There is a chronic mildly displaced fracture through the right side of the pubic symphysis. IMPRESSION: 1. No acute abnormality seen within the abdomen or pelvis. 2. Postoperative presacral stranding and nodularity has improved. Underlying dense calcification again seen. 3. Scattered diverticulosis along the remaining ascending colon. Left lower quadrant colostomy is unremarkable in appearance. 4. Chronic mildly displaced fracture through the right side of the pubic symphysis. Electronically Signed   By: Garald Balding M.D.   On: 09/07/2015 20:17   I have personally reviewed and evaluated these lab results as part of my medical decision-making.   EKG Interpretation None     8:55 PM patient feels well to go home after treatment with intravenous fluids. She was able to drink oral contrast without difficulty. Results for orders placed or performed during the hospital encounter of 09/07/15  Lipase, blood  Result Value Ref Range   Lipase 42 11 - 51 U/L  Comprehensive metabolic panel  Result Value Ref Range   Sodium 135 135 - 145 mmol/L   Potassium 3.8 3.5 - 5.1 mmol/L   Chloride 101 101 - 111 mmol/L   CO2 20 (L) 22 - 32 mmol/L   Glucose, Bld 177 (H) 65 - 99 mg/dL   BUN 35 (H) 6 - 20 mg/dL   Creatinine, Ser 2.80 (H) 0.44 - 1.00 mg/dL   Calcium 9.8 8.9 - 10.3 mg/dL   Total Protein 8.8 (H) 6.5 - 8.1 g/dL   Albumin 4.6 3.5 - 5.0 g/dL   AST 66 (H) 15 - 41 U/L   ALT 90 (H) 14 - 54 U/L   Alkaline Phosphatase 139 (H) 38 - 126 U/L   Total Bilirubin 1.1 0.3 - 1.2 mg/dL   GFR calc non Af Amer 16 (L) >60 mL/min   GFR calc Af Amer 19 (L) >60 mL/min   Anion gap 14 5 - 15  CBC  Result Value Ref Range   WBC 8.2 4.0 - 10.5 K/uL   RBC 4.96 3.87 - 5.11 MIL/uL   Hemoglobin 14.7 12.0 - 15.0 g/dL   HCT 44.0 36.0 - 46.0 %   MCV 88.7 78.0 - 100.0 fL   MCH 29.6 26.0 - 34.0 pg   MCHC 33.4 30.0 - 36.0 g/dL    RDW 13.8 11.5 - 15.5 %   Platelets 377 150 - 400 K/uL   Ct Abdomen Pelvis Wo Contrast  09/07/2015  CLINICAL DATA:  Acute onset of vomiting, generalized abdominal cramping  and weakness. Initial encounter. EXAM: CT ABDOMEN AND PELVIS WITHOUT CONTRAST TECHNIQUE: Multidetector CT imaging of the abdomen and pelvis was performed following the standard protocol without IV contrast. COMPARISON:  CT of the abdomen and pelvis from 11/07/2013 FINDINGS: Minimal bibasilar atelectasis is noted. Asymmetric nodularity within the right breast is grossly stable from 2015 and likely reflect normal fibroglandular tissue. Bilateral breast implants are noted, with calcification more prominent on the right. The liver and spleen are unremarkable in appearance. The patient is status post cholecystectomy, with clips noted at the gallbladder fossa. The pancreas and adrenal glands are unremarkable. The kidneys are unremarkable in appearance. There is no evidence of hydronephrosis. No renal or ureteral stones are seen. No perinephric stranding is appreciated. No free fluid is identified. The small bowel is largely filled with fluid and is grossly unremarkable in appearance. The stomach is within normal limits. No acute vascular abnormalities are seen. The patient is status post appendectomy, with multiple right-sided bowel suture lines noted. Scattered diverticulosis is noted along the remaining ascending colon. The patient's left lower quadrant colostomy is unremarkable in appearance. The patient is status post resection of the sigmoid colon and rectum. Postoperative presacral stranding and nodularity has improved. Underlying dense calcification is seen, similar in appearance to the prior study. The bladder is mildly distended and grossly unremarkable. The patient is status post hysterectomy. No suspicious adnexal masses are seen. No inguinal lymphadenopathy is seen. No acute osseous abnormalities are identified. There is a chronic mildly  displaced fracture through the right side of the pubic symphysis. IMPRESSION: 1. No acute abnormality seen within the abdomen or pelvis. 2. Postoperative presacral stranding and nodularity has improved. Underlying dense calcification again seen. 3. Scattered diverticulosis along the remaining ascending colon. Left lower quadrant colostomy is unremarkable in appearance. 4. Chronic mildly displaced fracture through the right side of the pubic symphysis. Electronically Signed   By: Garald Balding M.D.   On: 09/07/2015 20:17    MDM  Plan follow-up with [facility] at [facility] . Prescription Reglan renal insufficiency is chronic. Encourage oral hydration Final diagnoses:  None   diagnosis #1 abdominal pain  #2 nausea and vomiting #3 renal insufficiency #4 dehydration #5 hyperglycemia       Orlie Dakin, MD 09/07/15 [id number]

## 2015-09-07 NOTE — ED Notes (Signed)
Weakness, vomiting, abdominal pain. She has a colostomy bag.

## 2015-09-07 NOTE — Discharge Instructions (Signed)
Abdominal Pain, Adult Make sure that you drink at least six 8 ounce glasses of water each day. You're mildly dehydrated today. Your kidney function is slightly worse today than it was one year ago. BUN was 35. Creatinine was 2.8. Also, blood sugar was mildly elevated at 177. Please call your primary care physician at [facility] and asked her to compare these values to the most recent ones. Return if pain is severe, if you're unable to hold down fluids after taking the medication prescribed for nausea or if concerned for any reason. Many things can cause abdominal pain. Usually, abdominal pain is not caused by a disease and will improve without treatment. It can often be observed and treated at home. Your health care provider will do a physical exam and possibly order blood tests and X-rays to help determine the seriousness of your pain. However, in many cases, more time must pass before a clear cause of the pain can be found. Before that point, your health care provider may not know if you need more testing or further treatment. HOME CARE INSTRUCTIONS Monitor your abdominal pain for any changes. The following actions may help to alleviate any discomfort you are experiencing:  Only take over-the-counter or prescription medicines as directed by your health care provider.  Do not take laxatives unless directed to do so by your health care provider.  Try a clear liquid diet (broth, tea, or water) as directed by your health care provider. Slowly move to a bland diet as tolerated. SEEK MEDICAL CARE IF:  You have unexplained abdominal pain.  You have abdominal pain associated with nausea or diarrhea.  You have pain when you urinate or have a bowel movement.  You experience abdominal pain that wakes you in the night.  You have abdominal pain that is worsened or improved by eating food.  You have abdominal pain that is worsened with eating fatty foods.  You have a fever. SEEK IMMEDIATE MEDICAL CARE  IF:  Your pain does not go away within 2 hours.  You keep throwing up (vomiting).  Your pain is felt only in portions of the abdomen, such as the right side or the left lower portion of the abdomen.  You pass bloody or black tarry stools. MAKE SURE YOU:  Understand these instructions.  Will watch your condition.  Will get help right away if you are not doing well or get worse.   This information is not intended to replace advice given to you by your health care provider. Make sure you discuss any questions you have with your health care provider.   Document Released: 06/28/2005 Document Revised: 06/09/2015 Document Reviewed: 05/28/2013 Elsevier Interactive Patient Education [employer].

## 2016-05-23 ENCOUNTER — Emergency Department (HOSPITAL_BASED_OUTPATIENT_CLINIC_OR_DEPARTMENT_OTHER)
Admission: EM | Admit: 2016-05-23 | Discharge: 2016-05-23 | Disposition: A | Discharge status: Home / Self Care | Payer: Medicare HMO | Attending: Emergency Medicine | Admitting: Emergency Medicine

## 2016-05-23 ENCOUNTER — Emergency Department (HOSPITAL_BASED_OUTPATIENT_CLINIC_OR_DEPARTMENT_OTHER): Payer: Medicare HMO

## 2016-05-23 ENCOUNTER — Encounter (HOSPITAL_BASED_OUTPATIENT_CLINIC_OR_DEPARTMENT_OTHER): Payer: Self-pay | Admitting: Emergency Medicine

## 2016-05-23 DIAGNOSIS — R1084 Generalized abdominal pain: Secondary | ICD-10-CM | POA: Diagnosis present

## 2016-05-23 DIAGNOSIS — J45909 Unspecified asthma, uncomplicated: Secondary | ICD-10-CM | POA: Diagnosis not present

## 2016-05-23 DIAGNOSIS — Z8543 Personal history of malignant neoplasm of ovary: Secondary | ICD-10-CM | POA: Insufficient documentation

## 2016-05-23 DIAGNOSIS — E860 Dehydration: Secondary | ICD-10-CM | POA: Diagnosis not present

## 2016-05-23 DIAGNOSIS — R112 Nausea with vomiting, unspecified: Secondary | ICD-10-CM | POA: Insufficient documentation

## 2016-05-23 DIAGNOSIS — Z85118 Personal history of other malignant neoplasm of bronchus and lung: Secondary | ICD-10-CM | POA: Insufficient documentation

## 2016-05-23 DIAGNOSIS — N390 Urinary tract infection, site not specified: Secondary | ICD-10-CM

## 2016-05-23 LAB — URINE MICROSCOPIC-ADD ON

## 2016-05-23 LAB — I-STAT CG4 LACTIC ACID, ED
LACTIC ACID, VENOUS: 5.16 mmol/L — AB (ref 0.5–1.9)
LACTIC ACID, VENOUS: 2.21 mmol/L — AB (ref 0.5–1.9)
Lactic Acid, Venous: 4.88 mmol/L (ref 0.5–1.9)

## 2016-05-23 LAB — COMPREHENSIVE METABOLIC PANEL
ALT: 28 U/L (ref 14–54)
AST: 40 U/L (ref 15–41)
Albumin: 4.3 g/dL (ref 3.5–5.0)
Alkaline Phosphatase: 83 U/L (ref 38–126)
Anion gap: 15 (ref 5–15)
BILIRUBIN TOTAL: 0.5 mg/dL (ref 0.3–1.2)
BUN: 36 mg/dL — ABNORMAL HIGH (ref 6–20)
CALCIUM: 9.7 mg/dL (ref 8.9–10.3)
CHLORIDE: 99 mmol/L — AB (ref 101–111)
CO2: 21 mmol/L — ABNORMAL LOW (ref 22–32)
CREATININE: 2.3 mg/dL — AB (ref 0.44–1.00)
GFR, EST AFRICAN AMERICAN: 24 mL/min — AB (ref >60)
GFR, EST NON AFRICAN AMERICAN: 21 mL/min — AB (ref >60)
Glucose, Bld: 189 mg/dL — ABNORMAL HIGH (ref 65–99)
Potassium: 3.6 mmol/L (ref 3.5–5.1)
Sodium: 135 mmol/L (ref 135–145)
TOTAL PROTEIN: 8.6 g/dL — AB (ref 6.5–8.1)

## 2016-05-23 LAB — CBC WITH DIFFERENTIAL/PLATELET
Basophils Absolute: 0 10*3/uL (ref 0.0–0.1)
Basophils Relative: 0 %
EOS PCT: 1 %
Eosinophils Absolute: 0.1 10*3/uL (ref 0.0–0.7)
HCT: 42.5 % (ref 36.0–46.0)
Hemoglobin: 14.5 g/dL (ref 12.0–15.0)
LYMPHS ABS: 1.5 10*3/uL (ref 0.7–4.0)
LYMPHS PCT: 12 %
MCH: 30.6 pg (ref 26.0–34.0)
MCHC: 34.1 g/dL (ref 30.0–36.0)
MCV: 89.7 fL (ref 78.0–100.0)
MONO ABS: 1 10*3/uL (ref 0.1–1.0)
Monocytes Relative: 8 %
Neutro Abs: 10.7 10*3/uL — ABNORMAL HIGH (ref 1.7–7.7)
Neutrophils Relative %: 79 %
PLATELETS: 348 10*3/uL (ref 150–400)
RBC: 4.74 MIL/uL (ref 3.87–5.11)
RDW: 13.5 % (ref 11.5–15.5)
WBC: 13.3 10*3/uL — AB (ref 4.0–10.5)

## 2016-05-23 LAB — URINALYSIS, ROUTINE W REFLEX MICROSCOPIC
BILIRUBIN URINE: NEGATIVE
Glucose, UA: NEGATIVE mg/dL
KETONES UR: NEGATIVE mg/dL
Nitrite: NEGATIVE
PROTEIN: 30 mg/dL — AB
Specific Gravity, Urine: 1.019 (ref 1.005–1.030)
pH: 5.5 (ref 5.0–8.0)

## 2016-05-23 LAB — LIPASE, BLOOD: LIPASE: 52 U/L — AB (ref 11–51)

## 2016-05-23 MED ORDER — HYDROMORPHONE HCL 1 MG/ML IJ SOLN
0.5000 mg | Freq: Once | INTRAMUSCULAR | Status: DC
  Filled 2016-05-23: qty 1

## 2016-05-23 MED ORDER — CEPHALEXIN 500 MG PO CAPS: 1000.0000 mg | ORAL_CAPSULE | Freq: Two times a day (BID) | ORAL | 0 refills | Status: DC

## 2016-05-23 MED ORDER — SODIUM CHLORIDE 0.9 % IV BOLUS (SEPSIS)
500.0000 mL | Freq: Once | INTRAVENOUS | Status: AC
Start: 2016-05-23 — End: 2016-05-23
  Administered 2016-05-23: 500 mL via INTRAVENOUS

## 2016-05-23 MED ORDER — SODIUM CHLORIDE 0.9 % IV BOLUS (SEPSIS)
500.0000 mL | Freq: Once | INTRAVENOUS | Status: AC
  Administered 2016-05-23: 500 mL via INTRAVENOUS

## 2016-05-23 MED ORDER — ONDANSETRON HCL 4 MG/2ML IJ SOLN
4.0000 mg | Freq: Once | INTRAMUSCULAR | Status: AC
  Administered 2016-05-23: 4 mg via INTRAVENOUS
  Filled 2016-05-23: qty 2

## 2016-05-23 NOTE — ED Notes (Signed)
Patient denies pain and is resting comfortably.  

## 2016-05-23 NOTE — ED Provider Notes (Addendum)
[facility] Provider Note   CSN: [id number] Arrival date & time: 05/23/16  0200     History   Chief Complaint Chief Complaint  Patient presents with  . Dehydration    HPI Desiree Adams is a 70 y.o. female.  Patient presents to the emergency department with complaints of nausea, abdominal pain. Patient reports a previous history of multiple bowel obstructions requiring surgeries. She has a colostomy. Patient reports that earlier today she started having abdominal pain and distention followed by nausea and vomiting. She reports that she has since started to drain through her colostomy and the distention has resolved. She has had frequent episodes of emesis, however, and is now experiencing muscle cramps that she has experienced before with dehydration. Patient reports that her abdominal pain has resolved.      Past Medical History:  Diagnosis Date  . Anxiety   . Asthma    as a "baby"  . Biliary colic   . Carcinoid tumor, bronchus, lung, malignant ([place])   . Difficult intubation   . History of Clostridium difficile infection   . Other and unspecified noninfectious gastroenteritis and colitis(558.9) 01/2001   nonspecific colitis most likely from radiation  . Ovarian cancer ([place])   . PONV (postoperative nausea and vomiting)    always nausea and  vomiting  . Right lower lobe lung mass   . Small bowel obstruction ([place])   . Urinary tract infection   . Vitamin B12 deficiency     Patient Active Problem List   Diagnosis Date Noted  . 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 Thrombosed external hemorrhoid-right 10/28/2012  . Right lower lobe lung mass   . 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 Rectal ulcer 12/25/2011  . 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 VITAMIN B12 DEFICIENCY 11/11/2008  . BILIARY COLIC 99991111  . DIARRHEA, CHRONIC 11/11/2008  . NEOPLASM, MALIGNANT, OVARY, HX OF 11/11/2008  . Personal history of other diseases of digestive system 11/11/2008     Past Surgical History:  Procedure Laterality Date  . APPENDECTOMY    . BREAST ENHANCEMENT SURGERY    . COLONOSCOPY  12/25/2011   Procedure: COLONOSCOPY;  Surgeon: Lafayette Dragon, MD;  Location: [facility] END[facility]COPY;  Service: Endoscopy;  Laterality: N/A;  . EXPLORATORY LAPAROTOMY W/ BOWEL RESECTION    . HEMORRHOID SURGERY N/A 06/04/2013   Procedure: single coulum HEMORRHOIDECTOMY;  Surgeon: Odis Hollingshead, MD;  Location: [facility] ORS;  Service: General;  Laterality: N/A;  . LAPAROSCOPIC CHOLECYSTECTOMY    . LUNG REMOVAL, PARTIAL    . TOTAL ABDOMINAL HYSTERECTOMY W/ BILATERAL SALPINGOOPHORECTOMY    . VIDEO ASSISTED THORACOSCOPY  04/25/12   right    OB History    No data available       Home Medications    Prior to Admission medications   Medication Sig Start Date End Date Taking? Authorizing Provider  ALPRAZolam (XANAX) 0.25 MG tablet Take 0.125-0.25 mg by mouth at bedtime as needed for sleep.    Historical Provider, MD  amoxicillin-clavulanate (AUGMENTIN) 875-125 MG per tablet Take 1 tablet by mouth 2 (two) times daily.  12/10/14   Historical Provider, MD  cyanocobalamin (,VITAMIN B-12,) 1000 MCG/ML injection Inject 1 ml SQ in deltoid once monthly. Pharmacy-please include 3 ml syringes and 1" 25 gauge needles. 06/12/13   Lafayette Dragon, MD  metoCLOPramide (REGLAN) 10 MG tablet Take 1 tablet (10 mg total) by mouth every 8 (eight) hours as needed for nausea or vomiting (nausea/headache). 09/07/15  Orlie Dakin, MD  Multiple Vitamin (MULTIVITAMIN WITH MINERALS) TABS tablet Take 1 tablet by mouth daily.    Historical Provider, MD    Family History Family History  Problem Relation Age of Onset  . Kidney cancer      ???? Grandfather  . Bladder Cancer Maternal Grandfather   . Colon cancer Neg Hx     Social History Social History  Substance Use Topics  . Smoking status: Never Smoker  . Smokeless tobacco: Never Used  . Alcohol use Yes     Comment: very rarely     Allergies    Sulfamethoxazole   Review of Systems Review of Systems  Gastrointestinal: Positive for abdominal pain, nausea and vomiting.  All other systems reviewed and are negative.    Physical Exam Updated Vital Signs BP 98/57 (BP Location: Right Arm)   Pulse 84   Temp 98.7 F (37.1 C)   Resp 18   Ht 5\' 4"  (1.626 m)   Wt 125 lb (56.7 kg)   SpO2 97%   BMI 21.46 kg/m   Physical Exam  Constitutional: She is oriented to person, place, and time. She appears well-developed and well-nourished. No distress.  HENT:  Head: Normocephalic and atraumatic.  Right Ear: Hearing normal.  Left Ear: Hearing normal.  Nose: Nose normal.  Mouth/Throat: Oropharynx is clear and moist and mucous membranes are normal.  Eyes: Conjunctivae and EOM are normal. Pupils are equal, round, and reactive to light.  Neck: Normal range of motion. Neck supple.  Cardiovascular: Regular rhythm, S1 normal and S2 normal.  Exam reveals no gallop and no friction rub.   No murmur heard. Pulmonary/Chest: Effort normal and breath sounds normal. No respiratory distress. She exhibits no tenderness.  Abdominal: Soft. Normal appearance and bowel sounds are normal. There is no hepatosplenomegaly. There is no tenderness. There is no rebound, no guarding, no tenderness at McBurney's point and negative Murphy's sign. No hernia.  Musculoskeletal: Normal range of motion.  Neurological: She is alert and oriented to person, place, and time. She has normal strength. No cranial nerve deficit or sensory deficit. Coordination normal. GCS eye subscore is 4. GCS verbal subscore is 5. GCS motor subscore is 6.  Skin: Skin is warm, dry and intact. No rash noted. No cyanosis.  Psychiatric: She has a normal mood and affect. Her speech is normal and behavior is normal. Thought content normal.  Nursing note and vitals reviewed.    ED Treatments / Results  Labs (all labs ordered are listed, but only abnormal results are displayed) Labs Reviewed  CBC  WITH DIFFERENTIAL/PLATELET - Abnormal; Notable for the following:       Result Value   WBC 13.3 (*)    Neutro Abs 10.7 (*)    All other components within normal limits  COMPREHENSIVE METABOLIC PANEL - Abnormal; Notable for the following:    Chloride 99 (*)    CO2 21 (*)    Glucose, Bld 189 (*)    BUN 36 (*)    Creatinine, Ser 2.30 (*)    Total Protein 8.6 (*)    GFR calc non Af Amer 21 (*)    GFR calc Af Amer 24 (*)    All other components within normal limits  LIPASE, BLOOD - Abnormal; Notable for the following:    Lipase 52 (*)    All other components within normal limits  I-STAT CG4 LACTIC ACID, ED - Abnormal; Notable for the following:    Lactic Acid, Venous 5.16 (*)  All other components within normal limits  I-STAT CG4 LACTIC ACID, ED - Abnormal; Notable for the following:    Lactic Acid, Venous 4.88 (*)    All other components within normal limits  URINALYSIS, ROUTINE W REFLEX MICROSCOPIC (NOT AT [facility])    EKG  EKG Interpretation  Date/Time:  Tuesday May 23 2016 02:23:38 EDT Ventricular Rate:  91 PR Interval:    QRS Duration: 88 QT Interval:  362 QTC Calculation: 446 R Axis:   -5 Text Interpretation:  Sinus rhythm Normal ECG Confirmed by POLLINA  MD, CHRISTOPHER [id number]) on 05/23/2016 2:25:39 AM       Radiology No results found.  Procedures Procedures (including critical care time)  Medications Ordered in ED Medications  HYDROmorphone (DILAUDID) injection 0.5 mg (0.5 mg Intravenous Not Given 05/23/16 0225)  sodium chloride 0.9 % bolus 500 mL (0 mLs Intravenous Stopped 05/23/16 0345)  ondansetron (ZOFRAN) injection 4 mg (4 mg Intravenous Given 05/23/16 0228)  sodium chloride 0.9 % bolus 500 mL (500 mLs Intravenous New Bag/Given 05/23/16 0409)     Initial Impression / Assessment and Plan / ED Course  I have reviewed the triage vital signs and the nursing notes.  Pertinent labs & imaging results that were available during my care of the patient were  reviewed by me and considered in my medical decision making (see chart for details).  Clinical Course  Patient presents to the emergency department for evaluation of nausea and vomiting with abdominal pain. Patient reports that she has a history of recurrent similar episodes. Patient has had small bowel obstructions requiring surgery in the past. She does have a colostomy. She reports that earlier today she started having abdominal pain with distention. This continued through the course of the day with multiple episodes of emesis. This evening she has become weak and feels like she is very dehydrated. She is complaining of diffuse muscle spasms which she has had with her dehydration in the past. Patient was given IV fluids and antiemetics. She reports decompression of her abdomen spontaneously before arrival, with passage of stool through her colostomy. Abdomen is soft at this time. With her previous history, however, I did recommend imaging. Patient has declined plain film x-ray and CAT scan. Lab work reveals mild leukocytosis. She does have an elevated lactic acid as well. This is concerning for intra-abdominal pathology. This was discussed with the patient and she has declined imaging.  She has been monitored continuously here in the emergency department. After the first 500 mL fluid she was feeling much better. She was given additional fluid and continues to do well. Blood pressure is improved. Serial examinations reveal that she is resting comfortably in abdomen is nontender and benign. She does have bowel sounds.  Elevated lactic acid discussed with patient. She understands that this could mean ischemic bowel. Once again, declines imaging and admission. Initial lactate was above 5. Repeat lactate after 500 mL of fluid was 4.88. This is reassuring.  Due to her previous history and surgeries, however, I could not rule out recurrent obstruction without imaging. This was discussed with the patient. She  reports that she has been through this many times, knows her body and does not feel that she requires hospitalization or further testing. She has texted her son to come pick her up. Patient will not agree to admission or further testing, will be discharged and is to return immediately if her symptoms worsen.  Final Clinical Impressions(s) / ED Diagnoses   Final diagnoses:  Nausea  and vomiting, vomiting of unspecified type  Generalized abdominal pain  Dehydration    New Prescriptions New Prescriptions   No medications on file     Orpah Greek, MD 05/23/16 [id number]    Orpah Greek, MD 05/23/16 [phone]

## 2016-05-23 NOTE — ED Triage Notes (Signed)
Pt c/o cramping in all extremities after recent episode of N/V which she believes has caused her to become dehydrated and causing muscle cramping. Pt reports similar in the past.

## 2016-05-25 LAB — URINE CULTURE: Culture: >=100000 — AB

## 2016-05-26 ENCOUNTER — Telehealth (HOSPITAL_BASED_OUTPATIENT_CLINIC_OR_DEPARTMENT_OTHER): Payer: Self-pay | Admitting: Emergency Medicine

## 2016-05-26 NOTE — Telephone Encounter (Signed)
Post ED Visit - Positive Culture Follow-up  Culture report reviewed by antimicrobial stewardship pharmacist:  []  Elenor Quinones, Pharm.D. []  Heide Guile, Pharm.D., BCPS []  Parks Neptune, Pharm.D. []  Alycia Rossetti, Pharm.D., BCPS []  [place], Pharm.D., BCPS, AAHIVP []  Legrand Como, Pharm.D., BCPS, AAHIVP []  Milus Glazier, Pharm.D. []  Stephens November, Pharm.D. Andria Meuse RPh  Positive urine culture Treated with cephalexin, organism sensitive to the same and no further patient follow-up is required at this time.  Hazle Nordmann 05/26/2016, 4:43 PM

## 2016-05-29 ENCOUNTER — Telehealth: Payer: Self-pay | Admitting: Internal Medicine

## 2016-05-30 NOTE — Telephone Encounter (Signed)
Left message for patient to call back  

## 2016-06-01 NOTE — Telephone Encounter (Signed)
Attempted to return call again x2 this am.  Phone line picks up them immediately hangs up.  I will await a return call from the patient

## 2016-10-24 ENCOUNTER — Observation Stay (HOSPITAL_BASED_OUTPATIENT_CLINIC_OR_DEPARTMENT_OTHER)
Admission: EM | Admit: 2016-10-24 | Discharge: 2016-10-25 | Discharge status: Against Medical Advice | Payer: Medicare HMO | Attending: Internal Medicine | Admitting: Internal Medicine

## 2016-10-24 ENCOUNTER — Encounter (HOSPITAL_BASED_OUTPATIENT_CLINIC_OR_DEPARTMENT_OTHER): Payer: Self-pay | Admitting: Emergency Medicine

## 2016-10-24 ENCOUNTER — Emergency Department (HOSPITAL_BASED_OUTPATIENT_CLINIC_OR_DEPARTMENT_OTHER): Payer: Medicare HMO

## 2016-10-24 DIAGNOSIS — K6389 Other specified diseases of intestine: Secondary | ICD-10-CM | POA: Diagnosis present

## 2016-10-24 DIAGNOSIS — Z9049 Acquired absence of other specified parts of digestive tract: Secondary | ICD-10-CM | POA: Insufficient documentation

## 2016-10-24 DIAGNOSIS — Z933 Colostomy status: Secondary | ICD-10-CM | POA: Insufficient documentation

## 2016-10-24 DIAGNOSIS — N390 Urinary tract infection, site not specified: Secondary | ICD-10-CM | POA: Diagnosis not present

## 2016-10-24 DIAGNOSIS — Z923 Personal history of irradiation: Secondary | ICD-10-CM | POA: Diagnosis not present

## 2016-10-24 DIAGNOSIS — Z79899 Other long term (current) drug therapy: Secondary | ICD-10-CM | POA: Insufficient documentation

## 2016-10-24 DIAGNOSIS — E538 Deficiency of other specified B group vitamins: Secondary | ICD-10-CM | POA: Insufficient documentation

## 2016-10-24 DIAGNOSIS — Z902 Acquired absence of lung [part of]: Secondary | ICD-10-CM | POA: Insufficient documentation

## 2016-10-24 DIAGNOSIS — K56609 Unspecified intestinal obstruction, unspecified as to partial versus complete obstruction: Secondary | ICD-10-CM | POA: Diagnosis present

## 2016-10-24 DIAGNOSIS — Z8543 Personal history of malignant neoplasm of ovary: Secondary | ICD-10-CM | POA: Insufficient documentation

## 2016-10-24 DIAGNOSIS — Z85110 Personal history of malignant carcinoid tumor of bronchus and lung: Secondary | ICD-10-CM | POA: Insufficient documentation

## 2016-10-24 DIAGNOSIS — F419 Anxiety disorder, unspecified: Secondary | ICD-10-CM | POA: Insufficient documentation

## 2016-10-24 DIAGNOSIS — Z90710 Acquired absence of both cervix and uterus: Secondary | ICD-10-CM | POA: Insufficient documentation

## 2016-10-24 DIAGNOSIS — K565 Intestinal adhesions [bands], unspecified as to partial versus complete obstruction: Secondary | ICD-10-CM

## 2016-10-24 DIAGNOSIS — Z9079 Acquired absence of other genital organ(s): Secondary | ICD-10-CM | POA: Insufficient documentation

## 2016-10-24 DIAGNOSIS — Z90722 Acquired absence of ovaries, bilateral: Secondary | ICD-10-CM | POA: Insufficient documentation

## 2016-10-24 DIAGNOSIS — R1084 Generalized abdominal pain: Secondary | ICD-10-CM

## 2016-10-24 DIAGNOSIS — R112 Nausea with vomiting, unspecified: Secondary | ICD-10-CM

## 2016-10-24 LAB — URINALYSIS, ROUTINE W REFLEX MICROSCOPIC
BILIRUBIN URINE: NEGATIVE
GLUCOSE, UA: NEGATIVE mg/dL
HGB URINE DIPSTICK: NEGATIVE
Ketones, ur: NEGATIVE mg/dL
Nitrite: POSITIVE — AB
Protein, ur: 30 mg/dL — AB
SPECIFIC GRAVITY, URINE: 1.02 (ref 1.005–1.030)
pH: 5.5 (ref 5.0–8.0)

## 2016-10-24 LAB — COMPREHENSIVE METABOLIC PANEL
ALBUMIN: 4.5 g/dL (ref 3.5–5.0)
ALT: 35 U/L (ref 14–54)
ANION GAP: 10 (ref 5–15)
AST: 45 U/L — ABNORMAL HIGH (ref 15–41)
Alkaline Phosphatase: 89 U/L (ref 38–126)
BUN: 35 mg/dL — ABNORMAL HIGH (ref 6–20)
CO2: 25 mmol/L (ref 22–32)
Calcium: 9.6 mg/dL (ref 8.9–10.3)
Chloride: 102 mmol/L (ref 101–111)
Creatinine, Ser: 2.08 mg/dL — ABNORMAL HIGH (ref 0.44–1.00)
GFR calc Af Amer: 27 mL/min — ABNORMAL LOW (ref >60)
GFR, EST NON AFRICAN AMERICAN: 23 mL/min — AB (ref >60)
GLUCOSE: 130 mg/dL — AB (ref 65–99)
POTASSIUM: 4 mmol/L (ref 3.5–5.1)
Sodium: 137 mmol/L (ref 135–145)
Total Bilirubin: 0.6 mg/dL (ref 0.3–1.2)
Total Protein: 8.4 g/dL — ABNORMAL HIGH (ref 6.5–8.1)

## 2016-10-24 LAB — CBC WITH DIFFERENTIAL/PLATELET
BASOS PCT: 0 %
Basophils Absolute: 0 10*3/uL (ref 0.0–0.1)
EOS PCT: 1 %
Eosinophils Absolute: 0.1 10*3/uL (ref 0.0–0.7)
HCT: 40.6 % (ref 36.0–46.0)
Hemoglobin: 13.6 g/dL (ref 12.0–15.0)
Lymphocytes Relative: 16 %
Lymphs Abs: 2 10*3/uL (ref 0.7–4.0)
MCH: 30.7 pg (ref 26.0–34.0)
MCHC: 33.5 g/dL (ref 30.0–36.0)
MCV: 91.6 fL (ref 78.0–100.0)
MONO ABS: 0.8 10*3/uL (ref 0.1–1.0)
MONOS PCT: 6 %
NEUTROS ABS: 10.1 10*3/uL — AB (ref 1.7–7.7)
Neutrophils Relative %: 77 %
Platelets: 363 10*3/uL (ref 150–400)
RBC: 4.43 MIL/uL (ref 3.87–5.11)
RDW: 14.2 % (ref 11.5–15.5)
WBC: 12.9 10*3/uL — AB (ref 4.0–10.5)

## 2016-10-24 LAB — URINALYSIS, MICROSCOPIC (REFLEX)

## 2016-10-24 LAB — I-STAT CG4 LACTIC ACID, ED: LACTIC ACID, VENOUS: 1.35 mmol/L (ref 0.5–1.9)

## 2016-10-24 LAB — LIPASE, BLOOD: Lipase: 47 U/L (ref 11–51)

## 2016-10-24 MED ORDER — ONDANSETRON HCL 4 MG/2ML IJ SOLN
4.0000 mg | Freq: Once | INTRAMUSCULAR | Status: AC
  Administered 2016-10-24: 4 mg via INTRAVENOUS
  Filled 2016-10-24: qty 2

## 2016-10-24 MED ORDER — SODIUM CHLORIDE 0.9 % IV BOLUS (SEPSIS)
1000.0000 mL | Freq: Once | INTRAVENOUS | Status: AC
  Administered 2016-10-24: 1000 mL via INTRAVENOUS

## 2016-10-24 MED ORDER — SODIUM CHLORIDE 0.9 % IV BOLUS (SEPSIS)
1000.0000 mL | Freq: Once | INTRAVENOUS | Status: AC
  Administered 2016-10-25: 1000 mL via INTRAVENOUS

## 2016-10-24 MED ORDER — DEXTROSE 5 % IV SOLN
1.0000 g | Freq: Once | INTRAVENOUS | Status: AC
  Administered 2016-10-25: 1 g via INTRAVENOUS
  Filled 2016-10-24: qty 10

## 2016-10-24 NOTE — ED Notes (Signed)
Patient transported to CT 

## 2016-10-24 NOTE — ED Provider Notes (Signed)
[place] DEPT [facility] Provider Note   CSN: [id number] Arrival date & time: 10/24/16  1740  By signing my name below, I, Jeanell Sparrow, attest that this documentation has been prepared under the direction and in the presence of Courtney Paris, MD. Electronically Signed: Jeanell Sparrow, Scribe. 10/24/2016. 8:38 PM.  History   Chief Complaint Chief Complaint  Patient presents with  . Abdominal Pain   The history is provided by the patient. No language interpreter was used.   HPI Comments: Desiree Adams is a 71 y.o. female with a PMHx of colostomy And numerous episodes of small bowel obstruction who presents to the Emergency Department complaining of constant moderate generalized abdominal pain that started about 8 hours ago. 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 Her pain episodes have worsened with age. Her pain was relieved by associated vomiting (2 episodes without blood). She currently rates the pain as a 4/10 and an 8/10 at its worst. Her ostomy output has been decreased. She reports associated symptoms of abdominal bloating, generalized cramping, and nausea. She denies any lightheadedness, URI-like symptoms, or other complaints.   She is primarily concerned about small bowel obstruction because she says this feels very similar to prior. The last episode Was last year.  PCP: [name], MD (Inactive)  Past Medical History:  Diagnosis Date  . Anxiety   . Asthma    as a "baby"  . Biliary colic   . Carcinoid tumor, bronchus, lung, malignant ([place])   . Difficult intubation   . History of Clostridium difficile infection   . Other and unspecified noninfectious gastroenteritis and colitis(558.9) 01/2001   nonspecific colitis most likely from radiation  . Ovarian cancer ([place])   . PONV (postoperative nausea and vomiting)    always nausea and  vomiting  . Right lower lobe lung mass   .  Small bowel obstruction   . Urinary tract infection   . Vitamin B12 deficiency     Patient Active Problem List   Diagnosis Date Noted  . 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 Thrombosed external hemorrhoid-right 10/28/2012  . Right lower lobe lung mass   . 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 Rectal ulcer 12/25/2011  . 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 VITAMIN B12 DEFICIENCY 11/11/2008  . BILIARY COLIC 05/15/4817  . DIARRHEA, CHRONIC 11/11/2008  . NEOPLASM, MALIGNANT, OVARY, HX OF 11/11/2008  . Personal history of other diseases of digestive system 11/11/2008    Past Surgical History:  Procedure Laterality Date  . APPENDECTOMY    . BREAST ENHANCEMENT SURGERY    . COLONOSCOPY  12/25/2011   Procedure: COLONOSCOPY;  Surgeon: Lafayette Dragon, MD;  Location: [facility];  Service: Endoscopy;  Laterality: N/A;  . EXPLORATORY LAPAROTOMY W/ BOWEL RESECTION    . HEMORRHOID SURGERY N/A 06/04/2013   Procedure: single coulum HEMORRHOIDECTOMY;  Surgeon: Odis Hollingshead, MD;  Location: [facility] ORS;  Service: General;  Laterality: N/A;  . LAPAROSCOPIC CHOLECYSTECTOMY    . LUNG REMOVAL, PARTIAL    . TOTAL ABDOMINAL HYSTERECTOMY W/ BILATERAL SALPINGOOPHORECTOMY    . VIDEO ASSISTED THORACOSCOPY  04/25/12   right    OB History    No data available       Home Medications    Prior to Admission medications   Medication Sig Start Date End Date Taking? Authorizing Provider  ALPRAZolam (XANAX) 0.25 MG tablet Take 0.125-0.25 mg by mouth  at bedtime as needed for sleep.    Historical Provider, MD  amoxicillin-clavulanate (AUGMENTIN) 875-125 MG per tablet Take 1 tablet by mouth 2 (two) times daily.  12/10/14   Historical Provider, MD  cephALEXin (KEFLEX) 500 MG capsule Take 2 capsules (1,000 mg total) by mouth 2 (two) times daily. 05/23/16   Orpah Greek, MD  cyanocobalamin (,VITAMIN B-12,) 1000 MCG/ML injection Inject 1 ml SQ in deltoid once monthly. Pharmacy-please include 3 ml syringes and  1" 25 gauge needles. 06/12/13   Lafayette Dragon, MD  metoCLOPramide (REGLAN) 10 MG tablet Take 1 tablet (10 mg total) by mouth every 8 (eight) hours as needed for nausea or vomiting (nausea/headache). 09/07/15   Orlie Dakin, MD  Multiple Vitamin (MULTIVITAMIN WITH MINERALS) TABS tablet Take 1 tablet by mouth daily.    Historical Provider, MD    Family History Family History  Problem Relation Age of Onset  . Kidney cancer      ???? Grandfather  . Bladder Cancer Maternal Grandfather   . Colon cancer Neg Hx     Social History Social History  Substance Use Topics  . Smoking status: Never Smoker  . Smokeless tobacco: Never Used  . Alcohol use Yes     Comment: very rarely     Allergies   Sulfamethoxazole   Review of Systems Review of Systems  Constitutional: Negative for activity change, chills, diaphoresis, fatigue and fever.  HENT: Negative for congestion and rhinorrhea.   Eyes: Negative for visual disturbance.  Respiratory: Negative for cough, chest tightness, shortness of breath and stridor.   Cardiovascular: Negative for chest pain, palpitations and leg swelling.  Gastrointestinal: Positive for abdominal pain, nausea and vomiting. Negative for abdominal distention, constipation and diarrhea.  Genitourinary: Negative for difficulty urinating, dysuria, flank pain, frequency, hematuria, menstrual problem, pelvic pain, vaginal bleeding and vaginal discharge.  Musculoskeletal: Negative for back pain and neck pain.  Skin: Negative for rash and wound.  Neurological: Negative for dizziness, weakness, light-headedness, numbness and headaches.  Psychiatric/Behavioral: Negative for agitation and confusion.  All other systems reviewed and are negative.    Physical Exam Updated Vital Signs BP 150/70 (BP Location: Left Arm)   Pulse 87   Temp 97.7 F (36.5 C) (Oral)   Resp 16   Ht 5\' 4"  (1.626 m)   Wt 124 lb (56.2 kg)   SpO2 99%   BMI 21.28 kg/m   Physical Exam    Constitutional: She is oriented to person, place, and time. She appears well-developed and well-nourished. No distress.  HENT:  Head: Normocephalic and atraumatic.  Right Ear: External ear normal.  Left Ear: External ear normal.  Nose: Nose normal.  Mouth/Throat: Oropharynx is clear and moist. No oropharyngeal exudate.  Eyes: Conjunctivae and EOM are normal. Pupils are equal, round, and reactive to light.  Neck: Normal range of motion. Neck supple.  Pulmonary/Chest: No stridor. No respiratory distress.  Squeak was heard, no wheeze.   Abdominal: She exhibits no distension. There is tenderness. There is no rebound.  Generalized TTP to abdomen, worse in the lower region and around ostomy site.   Neurological: She is alert and oriented to person, place, and time. She has normal reflexes. She exhibits normal muscle tone. Coordination normal.  Skin: Skin is warm. No rash noted. She is not diaphoretic. No erythema.  Nursing note and vitals reviewed.    ED Treatments / Results  DIAGNOSTIC STUDIES: Oxygen Saturation is 99% on RA, normal by my interpretation.    COORDINATION OF  CARE: 8:42 PM- Pt advised of plan for treatment and pt agrees.  Labs (all labs ordered are listed, but only abnormal results are displayed) Labs Reviewed  URINALYSIS, ROUTINE W REFLEX MICROSCOPIC - Abnormal; Notable for the following:       Result Value   APPearance CLOUDY (*)    Protein, ur 30 (*)    Nitrite POSITIVE (*)    Leukocytes, UA MODERATE (*)    All other components within normal limits  URINALYSIS, MICROSCOPIC (REFLEX) - Abnormal; Notable for the following:    Bacteria, UA MANY (*)    Squamous Epithelial / LPF 0-5 (*)    All other components within normal limits  CBC WITH DIFFERENTIAL/PLATELET - Abnormal; Notable for the following:    WBC 12.9 (*)    Neutro Abs 10.1 (*)    All other components within normal limits  COMPREHENSIVE METABOLIC PANEL - Abnormal; Notable for the following:    Glucose,  Bld 130 (*)    BUN 35 (*)    Creatinine, Ser 2.08 (*)    Total Protein 8.4 (*)    AST 45 (*)    GFR calc non Af Amer 23 (*)    GFR calc Af Amer 27 (*)    All other components within normal limits  URINE CULTURE  LIPASE, BLOOD  I-STAT CG4 LACTIC ACID, ED    EKG  EKG Interpretation None       Radiology Ct Abdomen Pelvis Wo Contrast  Result Date: 10/24/2016 CLINICAL DATA:  71 year old female with abdominal pain. History of small-bowel obstruction and colostomy. No output from the colostomy. EXAM: CT ABDOMEN AND PELVIS WITHOUT CONTRAST TECHNIQUE: Multidetector CT imaging of the abdomen and pelvis was performed following the standard protocol without IV contrast. COMPARISON:  Abdominal CT dated 09/07/2015 FINDINGS: Evaluation of this exam is limited in the absence of intravenous contrast. Lower chest: Linear densities at the right lung base extending to the lateral pleural surface appears similar to prior CT and likely related to postsurgical scarring. The visualized lung bases are otherwise clear. There is apparent dilatation of the aortic root which is all only partially visualized and not well evaluated. No intra-abdominal free air or free fluid. Hepatobiliary: The liver is unremarkable. No intrahepatic biliary ductal dilatation. Cholecystectomy. The there is mild dilatation of the CBD measuring up to 12 mm in diameter, likely post cholecystectomy. There is normal tapering of the central CBD at the head of the pancreas. No retained biliary stone identified. Pancreas: Unremarkable. No pancreatic ductal dilatation or surrounding inflammatory changes. Spleen: Normal in size without focal abnormality. Adrenals/Urinary Tract: The adrenal glands are unremarkable. There is mild bilateral renal parenchymal atrophy as well as cortical irregularity with areas of scarring, likely related to recurrent prior infection and infarct. A subcentimeter exophytic left renal lesion demonstrates high attenuation and  is not well characterized but may represent a complex/hemorrhagic cyst. There is a 1.4 x 1.2 cm right renal upper pole hypodense lesion which is not well characterized but appears stable and most likely represents a cyst. A 1.6 x 1.4 cm hypodense lesion arising from the medial aspect of the right kidney is not well evaluated but appears stable as well. Ultrasound may provide better characterisation of the renal lesions. There is a 2 mm parenchymal appearing calculus in the inferior pole of the left kidney. Punctate nonobstructing left renal stones noted. There is no hydronephrosis on either side. The visualized ureters and urinary bladder appear unremarkable. Stomach/Bowel: The stomach is distended with gastric contents  and oral contrast. There is no evidence of gastric outlet obstruction. There is postsurgical changes of the bowel with resection of distal colon, and a portion of the small bowel. Multiple anastomotic sutures noted including ileoileal as well as an ileocolonic anastomosis. There is abutment of loops of small bowel in the lower abdomen and pelvis to the 8 adjacent lobes as well as to the anterior pelvic wall compatible with adhesions. There is mild dilatation of small-bowel loops within the pelvis measuring up to 3.6 cm in diameter. There is fecalization of small bowel contents. There is mild engorgement of the mesentery in the lower abdomen and pelvis. Evaluation of these loops of bowel is limited due to non opacification with contrast. Findings however are concerning for the degree of obstruction secondary to adhesion. A transition zone may be present in the mid pelvis anteriorly (series 2, image 59) where there is adhesion of a loop of small bowel to the anterior pelvic peritoneal along the incisional scar. Follow-up with delayed images or small bowel series recommended to evaluate for passage of oral contrast into the colon. There is scattered colonic diverticula without active inflammatory  changes. A 1.3 cm lipoma noted in the mid transverse colon. There is a left lower quadrant colostomy which appears unremarkable. The appendix is surgically absent. Vascular/Lymphatic: The abdominal aorta and IVC are grossly unremarkable on this noncontrast study. No portal venous gas identified. There is no adenopathy. Reproductive: Hysterectomy. Other: There is stable postsurgical changes of the pelvic floor and presacral region with dystrophic calcification. Musculoskeletal: There is chronic fracture of the right pubic bone adjacent to the symphysis pubis with nonunion. The bones are osteopenic. No acute osseous pathology. Bilateral breast implants noted. There is a lobulated soft tissue density along the inferior and medial aspect of the right breast implant as seen on the prior CT. Clinical correlation is recommended. IMPRESSION: Postsurgical changes of the bowel with a left lower quadrant ostomy. Mild dilatation of distal small bowel within the pelvis is concerning for a degree of small-bowel obstruction secondary to adhesions. Correlation with clinical exam and follow-up small bowel series recommended to document passage of contrast into the colon. Colonic diverticulosis. Bilateral renal hypodense lesions as described appears similar to prior CT but not characterized. Ultrasound may provide better evaluation. Lobulated soft tissue density along the inferior and medial aspect of the right breast implant. Correlation with clinical exam and mammography studies recommended. Electronically Signed   By: Anner Crete M.D.   On: 10/24/2016 23:01    Procedures Procedures (including critical care time)  Medications Ordered in ED Medications  sodium chloride 0.9 % bolus 1,000 mL (0 mLs Intravenous Stopped 10/24/16 2129)  ondansetron (ZOFRAN) injection 4 mg (4 mg Intravenous Given 10/24/16 2053)  ondansetron (ZOFRAN) injection 4 mg (4 mg Intravenous Given 10/24/16 2304)  sodium chloride 0.9 % bolus 1,000 mL (0  mLs Intravenous Stopped 10/25/16 0155)  cefTRIAXone (ROCEPHIN) 1 g in dextrose 5 % 50 mL IVPB (0 g Intravenous Stopped 10/25/16 0045)     Initial Impression / Assessment and Plan / ED Course  I have reviewed the triage vital signs and the nursing notes.  Pertinent labs & imaging results that were available during my care of the patient were reviewed by me and considered in my medical decision making (see chart for details).     Desiree Adams is a 71 y.o. female with a PMHx of colostomy And numerous episodes of small bowel obstruction who presents to the Emergency Department complaining  of constant moderate generalized abdominal pain, Nausea, vomiting, bloating, and decreased ostomy output.  History and exam are seen above. On exam, patient has tenderness across her lower abdomen. Patient had decreased bowel sounds. Patient's lungs were clear. Back nontender. Exam otherwise unremarkable.  Clinical suspicion present for small bowel obstruction.  Patient laboratory testing showing evidence of leukocytosis. Urinalysis also shows evidence of UTI. CT imaging revealed concern for small bowel Obstruction.  Gen. Surgery was called and they recommended admission for were management. Patient called for mission after treatment for urinary tract infection.  While waiting for admission, patient reports that he wants to go home. She says that she has dealt with this "many times before" and knows how her course will likely go. She says that she is comfortable taking antibiotics for UTI as well as pain and nausea medicine. She says that she understands the risks of leaving that she could become dehydrated, have kidney failure, have a perforation or cause necrosis to her bowels, and could even die. Patient says that he will follow-up with her PCP as well as her surgery team at [facility]. Patient says she does not to be admitted tonight.  Patient understood strict return precautions for any continued or  worsened symptoms. Patient understands the rest of discharge. Patient understood that by leaving, it would be against medical advice. Patient decided to leave AMA. Patient given prescription for pain medicine, nausea medicine, and and about. Patient left AMA in stable condition.  Final Clinical Impressions(s) / ED Diagnoses   Final diagnoses:  Generalized abdominal pain  Small bowel obstruction due to adhesions  Nausea and vomiting, intractability of vomiting not specified, unspecified vomiting type  Urinary tract infection without hematuria, site unspecified    New Prescriptions Discharge Medication List as of 10/25/2016 12:34 AM    START taking these medications   Details  !! cephALEXin (KEFLEX) 500 MG capsule Take 1 capsule (500 mg total) by mouth 3 (three) times daily., Starting Wed 10/25/2016, Until Sat 11/04/2016, Print    ondansetron (ZOFRAN) 4 MG tablet Take 1 tablet (4 mg total) by mouth every 8 (eight) hours as needed for nausea or vomiting., Starting Wed 10/25/2016, Print     !! - Potential duplicate medications found. Please discuss with provider.     *I personally performed the services described in this documentation, which was scribed in my presence. The recorded information has been reviewed and is accurate.  Clinical Impression: 1. Generalized abdominal pain   2. Small bowel obstruction due to adhesions   3. Nausea and vomiting, intractability of vomiting not specified, unspecified vomiting type   4. Urinary tract infection without hematuria, site unspecified     Disposition: Leaving against medical advice  Condition: Stable  I have discussed the results, Dx and Tx plan with the pt(& family if present). He/she/they expressed understanding and agree(s) with the plan. Discharge instructions discussed at great length. Strict return precautions discussed and pt &/or family have verbalized understanding of the instructions. No further questions at time of discharge.     Discharge Medication List as of 10/25/2016 12:34 AM    START taking these medications   Details  !! cephALEXin (KEFLEX) 500 MG capsule Take 1 capsule (500 mg total) by mouth 3 (three) times daily., Starting Wed 10/25/2016, Until Sat 11/04/2016, Print    ondansetron (ZOFRAN) 4 MG tablet Take 1 tablet (4 mg total) by mouth every 8 (eight) hours as needed for nausea or vomiting., Starting Wed 10/25/2016, Print     !! -  Potential duplicate medications found. Please discuss with provider.      Follow Up: Lafayette Dragon, MD     [facility] EMERGENCY DEPARTMENT [address] [id number] [address] [place] [address] [phone]  If symptoms worsen      Courtney Paris, MD 10/25/16 1055

## 2016-10-24 NOTE — ED Notes (Signed)
ED Provider at bedside. 

## 2016-10-24 NOTE — Progress Notes (Signed)
Patient ID: Desiree Adams, female   DOB: 01-05-1946, 71 y.o.   MRN: [id number]  71 year old female with a past medical history of anxiety, asthma, carcinoid tumor, C. difficile colitis, ovarian cancer, right lower lobe lung mass, multiple episodes of small bowel obstruction who is coming to the emergency department with complaints of bloating, cramping, nausea and emesis similar to her episodes of SBO in the past. CT scan imaging shows possible SBO and workup shows the also urine tract infection, please see below reports. Dr. Tina Griffiths has discussed this with Dr. Lucia Gaskins from general surgery who recommended admission to hospitalist service and will be consulting. Patient received IV fluids, antiemetics and IV Rocephin in for UTI. Accepted to a MedSurg bed observation.   Comprehensive metabolic panel [[id number]] (Abnormal) Collected: 10/24/16 2000  Updated: 10/24/16 2213   Specimen Type: Blood   Specimen Source: Vein    Sodium 137 mmol/L   Potassium 4.0 mmol/L   Chloride 102 mmol/L   CO2 25 mmol/L   Glucose, Bld 130 (H) mg/dL   BUN 35 (H) mg/dL   Creatinine, Ser 2.08 (H) mg/dL   Calcium 9.6 mg/dL   Total Protein 8.4 (H) g/dL   Albumin 4.5 g/dL   AST 45 (H) U/L   ALT 35 U/L   Alkaline Phosphatase 89 U/L   Total Bilirubin 0.6 mg/dL   GFR calc non Af Amer 23 (L) mL/min   GFR calc Af Amer 27 (L) mL/min   Anion gap 10  Lipase, blood [[id number]] Collected: 10/24/16 2000  Updated: 10/24/16 2213   Specimen Type: Blood   Specimen Source: Vein    Lipase 47 U/L  CBC with Differential [[id number]] (Abnormal) Collected: 10/24/16 2000  Updated: 10/24/16 2204   Specimen Type: Blood   Specimen Source: Vein    WBC 12.9 (H) K/uL   RBC 4.43 MIL/uL   Hemoglobin 13.6 g/dL   HCT 40.6 %   MCV 91.6 fL   MCH 30.7 pg   MCHC 33.5 g/dL   RDW 14.2 %   Platelets 363 K/uL   Neutrophils Relative % 77 %   Neutro Abs 10.1 (H) K/uL   Lymphocytes Relative 16 %   Lymphs Abs 2.0 K/uL   Monocytes Relative 6 %    Monocytes Absolute 0.8 K/uL   Eosinophils Relative 1 %   Eosinophils Absolute 0.1 K/uL   Basophils Relative 0 %   Basophils Absolute 0.0 K/uL  Urine culture [[id number]] Collected: 10/24/16 1740  Updated: 10/24/16 2150   Specimen Type: Urine   Specimen Source: Urine, Clean Catch   I-Stat CG4 Lactic Acid, ED [[id number]] Collected: 10/24/16 2104  Updated: 10/24/16 2106   Specimen Type: Blood    Lactic Acid, Venous 1.35 mmol/L  Urinalysis, Microscopic (reflex) [[id number]] (Abnormal) Collected: 10/24/16 1740  Updated: 10/24/16 1810    RBC / HPF 0-5 RBC/hpf   WBC, UA TOO NUMEROUS TO COUNT WBC/hpf   Bacteria, UA MANY (A)   Squamous Epithelial / LPF 0-5 (A)  Urinalysis, Routine w reflex microscopic [[id number]] (Abnormal) Collected: 10/24/16 1740  Updated: 10/24/16 1809   Specimen Type: Urine   Specimen Source: Urine, Clean Catch    Color, Urine YELLOW   APPearance CLOUDY (A)   Specific Gravity, Urine 1.020   pH 5.5   Glucose, UA NEGATIVE mg/dL   Hgb urine dipstick NEGATIVE   Bilirubin Urine NEGATIVE   Ketones, ur NEGATIVE mg/dL   Protein, ur 30 (A) mg/dL   Nitrite POSITIVE (A)   Leukocytes, UA MODERATE (A)  CT scan abdomen/pelvis  IMPRESSION: Postsurgical changes of the bowel with a left lower quadrant ostomy. Mild dilatation of distal small bowel within the pelvis is concerning for a degree of small-bowel obstruction secondary to adhesions. Correlation with clinical exam and follow-up small bowel series recommended to document passage of contrast into the colon.  Colonic diverticulosis.  Bilateral renal hypodense lesions as described appears similar to prior CT but not characterized. Ultrasound may provide better evaluation.  Lobulated soft tissue density along the inferior and medial aspect of the right breast implant. Correlation with clinical exam and mammography studies recommended.   Electronically Signed   By: Anner Crete M.D.   On: 10/24/2016  23:01  ---------------------------------------------------------------------------------------------------------------------------------  Tennis Must, MD

## 2016-10-24 NOTE — ED Triage Notes (Signed)
Potential intestinal blockage. Pt has colostomy and sts she has had  "Hundreds" of blockages and the process is always the same.  She starts with bloating and cramping and the vomiting comes next.  She has the bloating and cramping so far.  Started at noon today.

## 2016-10-24 NOTE — ED Notes (Signed)
Pt drinking oral contrast at this time. 

## 2016-10-25 MED ORDER — CEPHALEXIN 500 MG PO CAPS: 500.0000 mg | ORAL_CAPSULE | Freq: Three times a day (TID) | ORAL | 0 refills | Status: AC

## 2016-10-25 MED ORDER — ONDANSETRON HCL 4 MG PO TABS: 4.0000 mg | ORAL_TABLET | Freq: Three times a day (TID) | ORAL | 0 refills | Status: DC | PRN

## 2016-10-25 NOTE — ED Notes (Signed)
ED Provider at bedside discussing plan of care.  Pt has decided that she is not willing to be transferred to [facility].

## 2016-10-26 LAB — URINE CULTURE: Culture: NO GROWTH

## 2016-12-24 ENCOUNTER — Emergency Department (HOSPITAL_BASED_OUTPATIENT_CLINIC_OR_DEPARTMENT_OTHER)
Admission: EM | Admit: 2016-12-24 | Discharge: 2016-12-24 | Disposition: A | Discharge status: Home / Self Care | Payer: Medicare HMO | Attending: Emergency Medicine | Admitting: Emergency Medicine

## 2016-12-24 ENCOUNTER — Encounter (HOSPITAL_BASED_OUTPATIENT_CLINIC_OR_DEPARTMENT_OTHER): Payer: Self-pay | Admitting: *Deleted

## 2016-12-24 ENCOUNTER — Emergency Department (HOSPITAL_BASED_OUTPATIENT_CLINIC_OR_DEPARTMENT_OTHER): Payer: Medicare HMO

## 2016-12-24 DIAGNOSIS — R109 Unspecified abdominal pain: Secondary | ICD-10-CM

## 2016-12-24 DIAGNOSIS — K56609 Unspecified intestinal obstruction, unspecified as to partial versus complete obstruction: Secondary | ICD-10-CM

## 2016-12-24 DIAGNOSIS — J45909 Unspecified asthma, uncomplicated: Secondary | ICD-10-CM | POA: Insufficient documentation

## 2016-12-24 DIAGNOSIS — Z79899 Other long term (current) drug therapy: Secondary | ICD-10-CM | POA: Insufficient documentation

## 2016-12-24 DIAGNOSIS — K56699 Other intestinal obstruction unspecified as to partial versus complete obstruction: Secondary | ICD-10-CM | POA: Insufficient documentation

## 2016-12-24 DIAGNOSIS — Z8719 Personal history of other diseases of the digestive system: Secondary | ICD-10-CM

## 2016-12-24 DIAGNOSIS — R1031 Right lower quadrant pain: Secondary | ICD-10-CM | POA: Diagnosis present

## 2016-12-24 LAB — COMPREHENSIVE METABOLIC PANEL
ALT: 32 U/L (ref 14–54)
AST: 48 U/L — AB (ref 15–41)
Albumin: 3.9 g/dL (ref 3.5–5.0)
Alkaline Phosphatase: 87 U/L (ref 38–126)
Anion gap: 9 (ref 5–15)
BUN: 28 mg/dL — ABNORMAL HIGH (ref 6–20)
CHLORIDE: 109 mmol/L (ref 101–111)
CO2: 20 mmol/L — AB (ref 22–32)
CREATININE: 1.73 mg/dL — AB (ref 0.44–1.00)
Calcium: 8.9 mg/dL (ref 8.9–10.3)
GFR calc Af Amer: 34 mL/min — ABNORMAL LOW (ref >60)
GFR calc non Af Amer: 29 mL/min — ABNORMAL LOW (ref >60)
Glucose, Bld: 114 mg/dL — ABNORMAL HIGH (ref 65–99)
POTASSIUM: 3.9 mmol/L (ref 3.5–5.1)
SODIUM: 138 mmol/L (ref 135–145)
Total Bilirubin: 0.6 mg/dL (ref 0.3–1.2)
Total Protein: 7.7 g/dL (ref 6.5–8.1)

## 2016-12-24 LAB — CBC WITH DIFFERENTIAL/PLATELET
BASOS ABS: 0.1 10*3/uL (ref 0.0–0.1)
BASOS PCT: 1 %
EOS ABS: 0.1 10*3/uL (ref 0.0–0.7)
Eosinophils Relative: 1 %
HCT: 38.2 % (ref 36.0–46.0)
Hemoglobin: 12.5 g/dL (ref 12.0–15.0)
LYMPHS ABS: 1.6 10*3/uL (ref 0.7–4.0)
Lymphocytes Relative: 15 %
MCH: 30.2 pg (ref 26.0–34.0)
MCHC: 32.7 g/dL (ref 30.0–36.0)
MCV: 92.3 fL (ref 78.0–100.0)
MONO ABS: 0.5 10*3/uL (ref 0.1–1.0)
Monocytes Relative: 5 %
NEUTROS PCT: 78 %
Neutro Abs: 8.4 10*3/uL — ABNORMAL HIGH (ref 1.7–7.7)
PLATELETS: 327 10*3/uL (ref 150–400)
RBC: 4.14 MIL/uL (ref 3.87–5.11)
RDW: 14 % (ref 11.5–15.5)
WBC: 10.6 10*3/uL — ABNORMAL HIGH (ref 4.0–10.5)

## 2016-12-24 LAB — LIPASE, BLOOD: LIPASE: 47 U/L (ref 11–51)

## 2016-12-24 MED ORDER — METOCLOPRAMIDE HCL 5 MG/ML IJ SOLN
10.0000 mg | Freq: Once | INTRAMUSCULAR | Status: AC
  Administered 2016-12-24: 10 mg via INTRAVENOUS
  Filled 2016-12-24: qty 2

## 2016-12-24 MED ORDER — ONDANSETRON HCL 4 MG/2ML IJ SOLN
4.0000 mg | Freq: Once | INTRAMUSCULAR | Status: AC
  Administered 2016-12-24: 4 mg via INTRAVENOUS
  Filled 2016-12-24: qty 2

## 2016-12-24 MED ORDER — MORPHINE SULFATE (PF) 4 MG/ML IV SOLN: 4.0000 mg | Freq: Once | INTRAVENOUS | Status: DC

## 2016-12-24 MED ORDER — SODIUM CHLORIDE 0.9 % IV BOLUS (SEPSIS)
1000.0000 mL | Freq: Once | INTRAVENOUS | Status: AC
  Administered 2016-12-24: 1000 mL via INTRAVENOUS

## 2016-12-24 MED ORDER — SODIUM CHLORIDE 0.9 % IV SOLN
INTRAVENOUS | Status: DC
  Administered 2016-12-24: 11:00:00 via INTRAVENOUS

## 2016-12-24 MED ORDER — DIPHENHYDRAMINE HCL 50 MG/ML IJ SOLN
25.0000 mg | Freq: Once | INTRAMUSCULAR | Status: AC
  Administered 2016-12-24: 25 mg via INTRAVENOUS
  Filled 2016-12-24: qty 1

## 2016-12-24 NOTE — ED Notes (Signed)
Patient transported to CT 

## 2016-12-24 NOTE — ED Notes (Signed)
Pt discharged to home NAD.  

## 2016-12-24 NOTE — ED Notes (Signed)
ED Provider at bedside. 

## 2016-12-24 NOTE — ED Triage Notes (Signed)
Pt reports hx of bowel obstructions; states she has a colostomy bag and has had decreased output and n/v since around 0400. Denies fever, diarrhea.

## 2016-12-24 NOTE — ED Notes (Signed)
Ok to remove cardiac monitoring per Dr. Tyrone Nine.

## 2016-12-24 NOTE — ED Provider Notes (Signed)
[facility] DEPT [facility] Provider Note   CSN: [id number] Arrival date & time: 12/24/16  0755     History   Chief Complaint Chief Complaint  Patient presents with  . Emesis    HPI Desiree Adams is a 71 y.o. female.  71 yo F with a significant past medical history of ovarian cancer requiring multiple radiation therapies.  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 She comes in with sudden onset cramping and vomiting. She feels that this consistent with her past small bowel obstructions. Has decreased output in her ostomy bag. Some cramping lower abdominal pain without radiation. Denies fevers or chills. Denies recent injury.   The history is provided by the patient.  Emesis   This is a recurrent problem. The current episode started 3 to 5 hours ago. Associated symptoms include abdominal pain. Pertinent negatives include no arthralgias, no chills, no fever, no headaches and no myalgias.  Abdominal Pain   This is a recurrent problem. The current episode started 3 to 5 hours ago. The problem occurs constantly. The problem has been gradually worsening. The pain is associated with a previous surgery. The pain is located in the RLQ. The quality of the pain is cramping, colicky and sharp. The pain is at a severity of 7/10. The pain is severe. Associated symptoms include nausea and vomiting. Pertinent negatives include fever, dysuria, headaches, arthralgias and myalgias. Nothing aggravates the symptoms. Nothing relieves the symptoms. Past workup includes CT scan and surgery.    Past Medical History:  Diagnosis Date  . Anxiety   . Asthma    as a "baby"  . Biliary colic   . Carcinoid tumor, bronchus, lung, malignant ([place])   . Difficult intubation   . History of Clostridium difficile infection   . Other and unspecified noninfectious gastroenteritis and colitis(558.9) 01/2001   nonspecific colitis most likely from radiation    . Ovarian cancer ([place])   . PONV (postoperative nausea and vomiting)    always nausea and  vomiting  . Right lower lobe lung mass   . Small bowel obstruction   . Urinary tract infection   . Vitamin B12 deficiency     Patient Active Problem List   Diagnosis Date Noted  . SBO (small bowel obstruction) 10/24/2016  . 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 Thrombosed external hemorrhoid-right 10/28/2012  . Right lower lobe lung mass   . 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 Rectal ulcer 12/25/2011  . 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 VITAMIN B12 DEFICIENCY 11/11/2008  . BILIARY COLIC 40/34/7425  . DIARRHEA, CHRONIC 11/11/2008  . NEOPLASM, MALIGNANT, OVARY, HX OF 11/11/2008  . Personal history of other diseases of digestive system 11/11/2008    Past Surgical History:  Procedure Laterality Date  . APPENDECTOMY    . BREAST ENHANCEMENT SURGERY    . COLONOSCOPY  12/25/2011   Procedure: COLONOSCOPY;  Surgeon: Lafayette Dragon, MD;  Location: [facility];  Service: Endoscopy;  Laterality: N/A;  . EXPLORATORY LAPAROTOMY W/ BOWEL RESECTION    . HEMORRHOID SURGERY N/A 06/04/2013   Procedure: single coulum HEMORRHOIDECTOMY;  Surgeon: Odis Hollingshead, MD;  Location: [facility];  Service: General;  Laterality: N/A;  . LAPAROSCOPIC CHOLECYSTECTOMY    . LUNG REMOVAL, PARTIAL    . TOTAL ABDOMINAL HYSTERECTOMY W/ BILATERAL SALPINGOOPHORECTOMY    . VIDEO ASSISTED THORACOSCOPY  04/25/12   right    OB History    No data available  Home Medications    Prior to Admission medications   Medication Sig Start Date End Date Taking? Authorizing Provider  cyanocobalamin (,VITAMIN B-12,) 1000 MCG/ML injection Inject 1 ml SQ in deltoid once monthly. Pharmacy-please include 3 ml syringes and 1" 25 gauge needles. 06/12/13  Yes Lafayette Dragon, MD  Multiple Vitamin (MULTIVITAMIN WITH MINERALS) TABS tablet Take 1 tablet by mouth daily.   Yes Historical Provider, MD  ALPRAZolam (XANAX) 0.25 MG tablet Take  0.125-0.25 mg by mouth at bedtime as needed for sleep.    Historical Provider, MD  amoxicillin-clavulanate (AUGMENTIN) 875-125 MG per tablet Take 1 tablet by mouth 2 (two) times daily.  12/10/14   Historical Provider, MD  cephALEXin (KEFLEX) 500 MG capsule Take 2 capsules (1,000 mg total) by mouth 2 (two) times daily. 05/23/16   Orpah Greek, MD  metoCLOPramide (REGLAN) 10 MG tablet Take 1 tablet (10 mg total) by mouth every 8 (eight) hours as needed for nausea or vomiting (nausea/headache). 09/07/15   Orlie Dakin, MD  ondansetron (ZOFRAN) 4 MG tablet Take 1 tablet (4 mg total) by mouth every 8 (eight) hours as needed for nausea or vomiting. 10/25/16   Courtney Paris, MD    Family History Family History  Problem Relation Age of Onset  . Kidney cancer      ???? Grandfather  . Bladder Cancer Maternal Grandfather   . Colon cancer Neg Hx     Social History Social History  Substance Use Topics  . Smoking status: Never Smoker  . Smokeless tobacco: Never Used  . Alcohol use Yes     Comment: very rarely     Allergies   Sulfamethoxazole   Review of Systems Review of Systems  Constitutional: Negative for chills and fever.  HENT: Negative for congestion and rhinorrhea.   Eyes: Negative for redness and visual disturbance.  Respiratory: Negative for shortness of breath and wheezing.   Cardiovascular: Negative for chest pain and palpitations.  Gastrointestinal: Positive for abdominal distention, abdominal pain, nausea and vomiting.  Genitourinary: Negative for dysuria and urgency.  Musculoskeletal: Negative for arthralgias and myalgias.  Skin: Negative for pallor and wound.  Neurological: Negative for dizziness and headaches.     Physical Exam Updated Vital Signs BP 120/69   Pulse 86   Temp 98.2 F (36.8 C) (Oral)   Resp (!) 21   Ht 5\' 4"  (1.626 m)   Wt 130 lb (59 kg)   SpO2 95%   BMI 22.31 kg/m   Physical Exam  Constitutional: She is oriented to person,  place, and time. She appears well-developed and well-nourished. No distress.  HENT:  Head: Normocephalic and atraumatic.  Eyes: EOM are normal. Pupils are equal, round, and reactive to light.  Neck: Normal range of motion. Neck supple.  Cardiovascular: Normal rate and regular rhythm.  Exam reveals no gallop and no friction rub.   No murmur heard. Pulmonary/Chest: Effort normal. She has no wheezes. She has no rales.  Abdominal: Soft. She exhibits distension and mass (RLQ). There is tenderness (RLQ worse). There is no guarding.  Musculoskeletal: She exhibits no edema or tenderness.  Neurological: She is alert and oriented to person, place, and time.  Skin: Skin is warm and dry. She is not diaphoretic.  Psychiatric: She has a normal mood and affect. Her behavior is normal.  Nursing note and vitals reviewed.    ED Treatments / Results  Labs (all labs ordered are listed, but only abnormal results are displayed) Labs Reviewed  CBC  WITH DIFFERENTIAL/PLATELET - Abnormal; Notable for the following:       Result Value   WBC 10.6 (*)    Neutro Abs 8.4 (*)    All other components within normal limits  COMPREHENSIVE METABOLIC PANEL - Abnormal; Notable for the following:    CO2 20 (*)    Glucose, Bld 114 (*)    BUN 28 (*)    Creatinine, Ser 1.73 (*)    AST 48 (*)    GFR calc non Af Amer 29 (*)    GFR calc Af Amer 34 (*)    All other components within normal limits  LIPASE, BLOOD    EKG  EKG Interpretation  Date/Time:  Sunday December 24 2016 08:23:50 EDT Ventricular Rate:  79 PR Interval:    QRS Duration: 83 QT Interval:  368 QTC Calculation: 422 R Axis:   35 Text Interpretation:  Sinus rhythm No significant change since last tracing Confirmed by Jakorey Mcconathy MD, Quillian Quince ([id number]) on 12/24/2016 8:29:52 AM Also confirmed by Tyrone Nine MD, DANIEL [phone]), editor Drema Pry [phone])  on 12/24/2016 10:02:09 AM       Radiology Ct Abdomen Pelvis Wo Contrast  Result Date: 12/24/2016 CLINICAL  DATA:  Abdominal pain and bloating with vomiting. Patient reports cessation of colostomy output recently. EXAM: CT ABDOMEN AND PELVIS WITHOUT CONTRAST TECHNIQUE: Multidetector CT imaging of the abdomen and pelvis was performed following the standard protocol without IV contrast. Oral contrast was administered. COMPARISON:  October 24, 2016 and November 07, 2013 FINDINGS: Lower chest: There is mild scarring in the right lung base. No lung base edema or consolidation evident. There is a small hiatal hernia. Hepatobiliary: No liver lesions are apparent on this noncontrast enhanced study. Gallbladder is absent. There is dilatation of the common bile duct is 17 mm which does taper distally. No biliary duct mass or calculus is evident. Pancreas: There is no pancreatic mass or inflammatory focus. Spleen: No splenic lesions are evident. Adrenals/Urinary Tract: Adrenals appear unremarkable. There is a stable 9 x 8 mm hyperdense mass arising from the periphery of the left kidney, a likely small hyperdense cyst. There is an apparent cyst arising from the medial upper pole of the left kidney measuring 1.4 x 1.2 cm. No new renal mass evident. There is no appreciable hydronephrosis on either side. There is a the 1 mm calculus in the lower pole the left kidney. There is no ureteral calculus on either side. Urinary bladder is midline. The urinary bladder contains air. Stomach/Bowel: The patient has had a previous partial colon and rectum resection. There is postoperative change in the pelvis with areas of peritoneal calcification in the pelvis, stable. Presacral soft tissue thickening is stable. There is a left lower quadrant ostomy. There is no colonic dilatation. There is bowel dilatation to the level of the distal ileum near the ileocecal valve consistent with bowel obstruction distally. No free air or portal venous air is evident. Vascular/Lymphatic: There is no abdominal aortic aneurysm. There are scattered foci of  calcification in each common iliac artery. There is no evident adenopathy in the abdomen or pelvis. Reproductive: Uterus is absent.  No pelvic mass is demonstrable. Other: Appendix absent. No abscess or ascites evident in the abdomen or pelvis. Musculoskeletal: There are breast implants bilaterally. There is degenerative change in the lumbar spine. There is evidence of old trauma involving the right superior pubic ramus. Areas of osteoporosis noted. No lytic or destructive lesions are identified. The trabecula in the sacrum is somewhat coarsened, stable.  No intramuscular or abdominal wall lesion. There is a small presumed bone island in the inferior left iliac crest. IMPRESSION: Small bowel obstruction with transition zone near the terminal ileum. No free air. Postoperative changes noted throughout the abdomen and pelvis. There is a left lower quadrant ostomy. Postoperative change and presumed post radiation therapy change noted in the pelvis. There is stable presacral soft tissue prominence likely due to previous rectal removal with radiation therapy change. Calcification in the pelvis is likely due to chronic radiation therapy change. Coarsening of the trabecula of the sacrum likewise may be secondary to post radiation therapy change. Air is present in the urinary bladder. If patient has recently been instrumented with respect to the bladder, air could be present from that etiology. Assuming no recent bladder instrumentation, air in the bladder of this nature is most likely due either to gas-forming infection within the urinary bladder or possibly vesicoenteric fistula. Urinalysis advised in this regard. Small hiatal hernia. Small nonobstructing calculus lower pole left kidney. Probable hyperdense cyst left kidney, stable. Gallbladder absent. Common bile duct prominence 17 mm. No mass or calculus seen by CT. If further evaluation of the common bile duct is felt to be warranted, MRCP would be the imaging study of  choice to further assess. Old healed trauma medial right pubic symphysis. Electronically Signed   By: Lowella Grip III M.D.   On: 12/24/2016 09:54    Procedures Procedures (including critical care time)  Medications Ordered in ED Medications  morphine 4 MG/ML injection 4 mg (4 mg Intravenous Not Given 12/24/16 0836)  0.9 %  sodium chloride infusion ( Intravenous New Bag/Given 12/24/16 1031)  sodium chloride 0.9 % bolus 1,000 mL (0 mLs Intravenous Stopped 12/24/16 0920)  ondansetron (ZOFRAN) injection 4 mg (4 mg Intravenous Given 12/24/16 0834)  metoCLOPramide (REGLAN) injection 10 mg (10 mg Intravenous Given 12/24/16 1236)  diphenhydrAMINE (BENADRYL) injection 25 mg (25 mg Intravenous Given 12/24/16 1235)     Initial Impression / Assessment and Plan / ED Course  I have reviewed the triage vital signs and the nursing notes.  Pertinent labs & imaging results that were available during my care of the patient were reviewed by me and considered in my medical decision making (see chart for details).     71 yo F With a chief complaint of crampy lower abdominal pain nausea and vomiting. Patient has a history of multiple small bowel obstructions and feels like this feels the same. Will obtain a CT scan to evaluate. Give labs and fluids. Patient declining narcotics at this time.  CT scan reviewed by me and consistent with a small bowel obstruction. Radiology read concurs. I discussed the results with the patient. As she has had multiple of these and most have resolved spontaneously the patient is requesting to stay in the ED and given IV fluids. I will observe for a period in the ED.  Patient is feeling much better on the room. She is requesting discharge this time. Patient understands the risks and benefits of admission and is electing for discharge. Strict return precautions.  2:38 PM:  I have discussed the diagnosis/risks/treatment options with the patient and believe the pt to be eligible for  discharge home to follow-up with PCP, Surg ONC. We also discussed returning to the ED immediately if new or worsening sx occur. We discussed the sx which are most concerning (e.g., sudden worsening pain, fever, inability to tolerate by mouth) that necessitate immediate return. Medications administered to the patient during  their visit and any new prescriptions provided to the patient are listed below.  Medications given during this visit Medications  morphine 4 MG/ML injection 4 mg (4 mg Intravenous Not Given 12/24/16 0836)  0.9 %  sodium chloride infusion ( Intravenous New Bag/Given 12/24/16 1031)  sodium chloride 0.9 % bolus 1,000 mL (0 mLs Intravenous Stopped 12/24/16 0920)  ondansetron (ZOFRAN) injection 4 mg (4 mg Intravenous Given 12/24/16 0834)  metoCLOPramide (REGLAN) injection 10 mg (10 mg Intravenous Given 12/24/16 1236)  diphenhydrAMINE (BENADRYL) injection 25 mg (25 mg Intravenous Given 12/24/16 1235)     The patient appears reasonably screen and/or stabilized for discharge and I doubt any other medical condition or other [facility] requiring further screening, evaluation, or treatment in the ED at this time prior to discharge.    Final Clinical Impressions(s) / ED Diagnoses   Final diagnoses:  SBO (small bowel obstruction)    New Prescriptions New Prescriptions   No medications on file     Deno Etienne, DO 12/24/16 1439

## 2017-02-16 ENCOUNTER — Emergency Department (HOSPITAL_BASED_OUTPATIENT_CLINIC_OR_DEPARTMENT_OTHER)
Admission: EM | Admit: 2017-02-16 | Discharge: 2017-02-16 | Disposition: A | Discharge status: Home / Self Care | Payer: Medicare HMO | Attending: Emergency Medicine | Admitting: Emergency Medicine

## 2017-02-16 ENCOUNTER — Emergency Department (HOSPITAL_BASED_OUTPATIENT_CLINIC_OR_DEPARTMENT_OTHER): Payer: Medicare HMO

## 2017-02-16 ENCOUNTER — Encounter (HOSPITAL_BASED_OUTPATIENT_CLINIC_OR_DEPARTMENT_OTHER): Payer: Self-pay

## 2017-02-16 DIAGNOSIS — R112 Nausea with vomiting, unspecified: Secondary | ICD-10-CM | POA: Diagnosis present

## 2017-02-16 DIAGNOSIS — J45909 Unspecified asthma, uncomplicated: Secondary | ICD-10-CM | POA: Diagnosis not present

## 2017-02-16 DIAGNOSIS — R1110 Vomiting, unspecified: Secondary | ICD-10-CM

## 2017-02-16 LAB — CBC WITH DIFFERENTIAL/PLATELET
BASOS PCT: 0 %
Basophils Absolute: 0 10*3/uL (ref 0.0–0.1)
Eosinophils Absolute: 0.2 10*3/uL (ref 0.0–0.7)
Eosinophils Relative: 3 %
HEMATOCRIT: 36.4 % (ref 36.0–46.0)
Hemoglobin: 12.4 g/dL (ref 12.0–15.0)
LYMPHS PCT: 29 %
Lymphs Abs: 2.1 10*3/uL (ref 0.7–4.0)
MCH: 30.8 pg (ref 26.0–34.0)
MCHC: 34.1 g/dL (ref 30.0–36.0)
MCV: 90.5 fL (ref 78.0–100.0)
MONO ABS: 0.5 10*3/uL (ref 0.1–1.0)
MONOS PCT: 7 %
NEUTROS ABS: 4.4 10*3/uL (ref 1.7–7.7)
Neutrophils Relative %: 61 %
PLATELETS: 315 10*3/uL (ref 150–400)
RBC: 4.02 MIL/uL (ref 3.87–5.11)
RDW: 14.2 % (ref 11.5–15.5)
WBC: 7.2 10*3/uL (ref 4.0–10.5)

## 2017-02-16 LAB — BASIC METABOLIC PANEL
Anion gap: 10 (ref 5–15)
BUN: 25 mg/dL — AB (ref 6–20)
CALCIUM: 9.5 mg/dL (ref 8.9–10.3)
CO2: 21 mmol/L — AB (ref 22–32)
CREATININE: 1.91 mg/dL — AB (ref 0.44–1.00)
Chloride: 106 mmol/L (ref 101–111)
GFR calc Af Amer: 30 mL/min — ABNORMAL LOW (ref >60)
GFR calc non Af Amer: 25 mL/min — ABNORMAL LOW (ref >60)
Glucose, Bld: 106 mg/dL — ABNORMAL HIGH (ref 65–99)
Potassium: 3.7 mmol/L (ref 3.5–5.1)
Sodium: 137 mmol/L (ref 135–145)

## 2017-02-16 MED ORDER — SODIUM CHLORIDE 0.9 % IV BOLUS (SEPSIS)
1000.0000 mL | Freq: Once | INTRAVENOUS | Status: AC
  Administered 2017-02-16: 1000 mL via INTRAVENOUS

## 2017-02-16 MED ORDER — ONDANSETRON 4 MG PO TBDP
ORAL_TABLET | ORAL | Status: AC
  Filled 2017-02-16: qty 1

## 2017-02-16 MED ORDER — ONDANSETRON HCL 4 MG/2ML IJ SOLN
4.0000 mg | Freq: Once | INTRAMUSCULAR | Status: AC
  Administered 2017-02-16: 4 mg via INTRAVENOUS
  Filled 2017-02-16: qty 2

## 2017-02-16 MED ORDER — ONDANSETRON 4 MG PO TBDP
4.0000 mg | ORAL_TABLET | Freq: Once | ORAL | Status: AC
  Administered 2017-02-16: 4 mg via ORAL

## 2017-02-16 MED ORDER — SODIUM CHLORIDE 0.9 % IV SOLN
Freq: Once | INTRAVENOUS | Status: AC
  Administered 2017-02-16: 02:00:00 via INTRAVENOUS

## 2017-02-16 MED ORDER — ONDANSETRON HCL 4 MG/2ML IJ SOLN: 4.0000 mg | Freq: Once | INTRAMUSCULAR | Status: DC

## 2017-02-16 NOTE — ED Triage Notes (Signed)
Pt has hx of bowel obstruction and states she has another.  Her colostomy bag stopped emptying this afternoon, and pt's abdomen started to get distended, started vomiting in triage

## 2017-02-16 NOTE — ED Notes (Signed)
Pt states she feels well enough to go home

## 2017-02-16 NOTE — ED Notes (Signed)
Patient transported to X-ray 

## 2017-02-16 NOTE — ED Provider Notes (Signed)
[place] DEPT [facility] Provider Note: Georgena Spurling, MD, FACEP  CSN: [id number] MRN: [id number] ARRIVAL: 02/16/17 at [place]: [employer]  Desiree Adams is a 71 y.o. female with a history of multiple small bowel obstructions status post multiple surgeries. She is here with a sense of indigestion that began yesterday afternoon after eating soup. This progressed to nausea, vomiting, a sensation of abdominal bloating, no colostomy output and abdominal pain. She rated her abdominal pain is a 3 out of 10. She states her symptoms are consistent with previous bowel obstructions. She usually receives antiemetics and IV fluids and returns home with resolution. That is her intent on this visit. She was given IV fluids and Zofran IV on arrival per protocol.   Past Medical History:  Diagnosis Date  . Anxiety   . Asthma    pt denies  . Biliary colic   . Carcinoid tumor, bronchus, lung, malignant ([place])   . Difficult intubation   . History of Clostridium difficile infection   . Other and unspecified noninfectious gastroenteritis and colitis(558.9) 01/2001   nonspecific colitis most likely from radiation  . Ovarian cancer ([place])   . PONV (postoperative nausea and vomiting)    always nausea and  vomiting  . Right lower lobe lung mass   . Small bowel obstruction ([place])   . Urinary tract infection   . Vitamin B12 deficiency     Past Surgical History:  Procedure Laterality Date  . APPENDECTOMY    . BREAST ENHANCEMENT SURGERY    . COLONOSCOPY  12/25/2011   Procedure: COLONOSCOPY;  Surgeon: Lafayette Dragon, MD;  Location: [facility];  Service: Endoscopy;  Laterality: N/A;  . EXPLORATORY LAPAROTOMY W/ BOWEL RESECTION    . HEMORRHOID SURGERY N/A 06/04/2013   Procedure: single coulum HEMORRHOIDECTOMY;  Surgeon: Odis Hollingshead, MD;  Location: [facility];  Service: General;  Laterality: N/A;  . LAPAROSCOPIC CHOLECYSTECTOMY    . LUNG REMOVAL,  PARTIAL    . TOTAL ABDOMINAL HYSTERECTOMY W/ BILATERAL SALPINGOOPHORECTOMY    . VIDEO ASSISTED THORACOSCOPY  04/25/12   right    Family History  Problem Relation Age of Onset  . Kidney cancer Unknown        ???? Grandfather  . Bladder Cancer Maternal Grandfather   . Colon cancer Neg Hx     Social History  Substance Use Topics  . Smoking status: Never Smoker  . Smokeless tobacco: Never Used  . Alcohol use Yes     Comment: very rarely    Prior to Admission medications   Medication Sig Start Date End Date Taking? Authorizing Provider  ALPRAZolam (XANAX) 0.25 MG tablet Take 0.125-0.25 mg by mouth at bedtime as needed for sleep.    [provider]  amoxicillin-clavulanate (AUGMENTIN) 875-125 MG per tablet Take 1 tablet by mouth 2 (two) times daily.  12/10/14   [provider]  cephALEXin (KEFLEX) 500 MG capsule Take 2 capsules (1,000 mg total) by mouth 2 (two) times daily. 05/23/16   Orpah Greek, MD  cyanocobalamin (,VITAMIN B-12,) 1000 MCG/ML injection Inject 1 ml SQ in deltoid once monthly. Pharmacy-please include 3 ml syringes and 1" 25 gauge needles. 06/12/13   Lafayette Dragon, MD  metoCLOPramide (REGLAN) 10 MG tablet Take 1 tablet (10 mg total) by mouth every 8 (eight) hours as needed for nausea or vomiting (nausea/headache). 09/07/15   Orlie Dakin, MD  Multiple Vitamin (MULTIVITAMIN WITH MINERALS) TABS  tablet Take 1 tablet by mouth daily.    [provider]  ondansetron (ZOFRAN) 4 MG tablet Take 1 tablet (4 mg total) by mouth every 8 (eight) hours as needed for nausea or vomiting. 10/25/16   Tegeler, [name], MD    Allergies Sulfamethoxazole   REVIEW OF SYSTEMS  Negative except as noted here or in the History of Present Illness.   PHYSICAL EXAMINATION  Initial Vital Signs Blood pressure (!) 143/72, pulse 87, temperature 98.1 F (36.7 C), temperature source Oral, resp. rate 18, height 5\' 4"  (1.626 m), weight 128 lb (58.1 kg), SpO2  98 %.  Examination General: Well-developed, well-nourished female in no acute distress; appearance consistent with age of record HENT: normocephalic; atraumatic Eyes: pupils equal, round and reactive to light; extraocular muscles intact Neck: supple Heart: regular rate and rhythm Lungs: clear to auscultation bilaterally Abdomen: soft; mildly distended; mild diffuse tenderness; left lower quadrant colostomy without drainage; bowel sounds hypoactive Extremities: No deformity; full range of motion; pulses normal Neurologic: Awake, alert and oriented; motor function intact in all extremities and symmetric; no facial droop Skin: Warm and dry Psychiatric: Normal mood and affect   RESULTS  Summary of this visit's results, reviewed by myself:   EKG Interpretation  Date/Time:    Ventricular Rate:    PR Interval:    QRS Duration:   QT Interval:    QTC Calculation:   R Axis:     Text Interpretation:        Laboratory Studies: Results for orders placed or performed during the hospital encounter of 02/16/17 (from the past 24 hour(s))  CBC with Differential/Platelet     Status: None   Collection Time: 02/16/17  1:50 AM  Result Value Ref Range   WBC 7.2 4.0 - 10.5 K/uL   RBC 4.02 3.87 - 5.11 MIL/uL   Hemoglobin 12.4 12.0 - 15.0 g/dL   HCT 36.4 36.0 - 46.0 %   MCV 90.5 78.0 - 100.0 fL   MCH 30.8 26.0 - 34.0 pg   MCHC 34.1 30.0 - 36.0 g/dL   RDW 14.2 11.5 - 15.5 %   Platelets 315 150 - 400 K/uL   Neutrophils Relative % 61 %   Neutro Abs 4.4 1.7 - 7.7 K/uL   Lymphocytes Relative 29 %   Lymphs Abs 2.1 0.7 - 4.0 K/uL   Monocytes Relative 7 %   Monocytes Absolute 0.5 0.1 - 1.0 K/uL   Eosinophils Relative 3 %   Eosinophils Absolute 0.2 0.0 - 0.7 K/uL   Basophils Relative 0 %   Basophils Absolute 0.0 0.0 - 0.1 K/uL  Basic metabolic panel     Status: Abnormal   Collection Time: 02/16/17  1:50 AM  Result Value Ref Range   Sodium 137 135 - 145 mmol/L   Potassium 3.7 3.5 - 5.1 mmol/L    Chloride 106 101 - 111 mmol/L   CO2 21 (L) 22 - 32 mmol/L   Glucose, Bld 106 (H) 65 - 99 mg/dL   BUN 25 (H) 6 - 20 mg/dL   Creatinine, Ser 1.91 (H) 0.44 - 1.00 mg/dL   Calcium 9.5 8.9 - 10.3 mg/dL   GFR calc non Af Amer 25 (L) >60 mL/min   GFR calc Af Amer 30 (L) >60 mL/min   Anion gap 10 5 - 15   Imaging Studies: Dg Abd 2 Views  Result Date: 02/16/2017 CLINICAL DATA:  Vomiting.  Abdominal pain. EXAM: ABDOMEN - 2 VIEW COMPARISON:  CT 12/24/2016, 10/24/2016 FINDINGS:  No free intra-abdominal air. Air-fluid level noted in the stomach. No air-filled dilated small bowel. Multiple enteric sutures in the right abdomen. Small volume of stool in the colon. Colostomy in the left lower quadrant. Cholecystectomy clips in the right upper quadrant. Curvilinear calcifications/ hyperdensity in the pelvis is chronic. Chronic blunting of the right costophrenic angle. Stable osseous structures. IMPRESSION: No radiographic findings to suggest bowel obstruction. Small volume of colonic stool. Electronically Signed   By: Jeb Levering M.D.   On: 02/16/2017 02:08    ED COURSE  Nursing notes and initial vitals signs, including pulse oximetry, reviewed.  Vitals:   02/16/17 0048 02/16/17 0051 02/16/17 0442  BP: 134/87  (!) 143/72  Pulse: 88  87  Resp: 19  18  Temp: 98.1 F (36.7 C)    TempSrc: Oral    SpO2: 100%  98%  Weight:  128 lb (58.1 kg)   Height:  5\' 4"  (1.626 m)    4:59 AM Patient had been without emesis until a few minutes ago. Her Zofran was repeated and an additional liter of fluid ordered.  6:15 AM Patient has had no further vomiting. She states she is ready to go home. She states she does not need any prescriptions for antiemetics as she has some at home already.  PROCEDURES    ED DIAGNOSES     ICD-9-CM ICD-10-CM   1. Vomiting 787.03 R11.10 DG Abd 2 Views     DG Abd 2 Views       [place], Meldrick Buttery, MD 02/16/17 [phone]

## 2017-02-16 NOTE — ED Notes (Signed)
Pt verbalizes understanding of d/c instructions and denies any further need at this time. 

## 2017-02-16 NOTE — ED Notes (Signed)
Pt given additional warm blankets  

## 2017-02-16 NOTE — ED Notes (Signed)
Pt ambulatory to bathroom

## 2017-06-11 ENCOUNTER — Telehealth: Payer: Self-pay | Admitting: Internal Medicine

## 2017-06-11 NOTE — Telephone Encounter (Signed)
Pt states she has a sore around her ostomy and would like to have it looked at. Discussed with pt that she shoud contact her surgeon that did the colostomy. Pt states she does not see that surgeon anymore and has been seeing [facility]. Pt states she does not want to take off work to go spend the day in [place]. DIscussed with her that we have referred patients to an ostomy nurse at [facility] to speak with their ostomy nurses. Pt states she is quite frustrated because she has spoken to 4 different offices and been told different things. Offered to check with the DOD to see who they recommend she see and the patient stated "thanks for nothing" and hung up.

## 2017-10-26 ENCOUNTER — Encounter (HOSPITAL_BASED_OUTPATIENT_CLINIC_OR_DEPARTMENT_OTHER): Payer: Self-pay | Admitting: Adult Health

## 2017-10-26 ENCOUNTER — Other Ambulatory Visit: Payer: Self-pay

## 2017-10-26 ENCOUNTER — Emergency Department (HOSPITAL_BASED_OUTPATIENT_CLINIC_OR_DEPARTMENT_OTHER): Payer: Medicare HMO

## 2017-10-26 ENCOUNTER — Inpatient Hospital Stay (HOSPITAL_BASED_OUTPATIENT_CLINIC_OR_DEPARTMENT_OTHER)
Admission: EM | Admit: 2017-10-26 | Discharge: 2017-10-28 | DRG: 062 | Disposition: A | Discharge status: Home / Self Care | Payer: Medicare HMO | Attending: Neurology | Admitting: Neurology

## 2017-10-26 DIAGNOSIS — I639 Cerebral infarction, unspecified: Secondary | ICD-10-CM | POA: Diagnosis present

## 2017-10-26 DIAGNOSIS — I361 Nonrheumatic tricuspid (valve) insufficiency: Secondary | ICD-10-CM | POA: Diagnosis not present

## 2017-10-26 DIAGNOSIS — Z87891 Personal history of nicotine dependence: Secondary | ICD-10-CM

## 2017-10-26 DIAGNOSIS — J45909 Unspecified asthma, uncomplicated: Secondary | ICD-10-CM | POA: Diagnosis present

## 2017-10-26 DIAGNOSIS — I712 Thoracic aortic aneurysm, without rupture: Secondary | ICD-10-CM | POA: Diagnosis present

## 2017-10-26 DIAGNOSIS — E538 Deficiency of other specified B group vitamins: Secondary | ICD-10-CM | POA: Diagnosis present

## 2017-10-26 DIAGNOSIS — E876 Hypokalemia: Secondary | ICD-10-CM | POA: Diagnosis present

## 2017-10-26 DIAGNOSIS — I63531 Cerebral infarction due to unspecified occlusion or stenosis of right posterior cerebral artery: Secondary | ICD-10-CM

## 2017-10-26 DIAGNOSIS — Z85110 Personal history of malignant carcinoid tumor of bronchus and lung: Secondary | ICD-10-CM

## 2017-10-26 DIAGNOSIS — Z8052 Family history of malignant neoplasm of bladder: Secondary | ICD-10-CM | POA: Diagnosis not present

## 2017-10-26 DIAGNOSIS — E781 Pure hyperglyceridemia: Secondary | ICD-10-CM | POA: Diagnosis not present

## 2017-10-26 DIAGNOSIS — Z8543 Personal history of malignant neoplasm of ovary: Secondary | ICD-10-CM

## 2017-10-26 DIAGNOSIS — N184 Chronic kidney disease, stage 4 (severe): Secondary | ICD-10-CM | POA: Diagnosis present

## 2017-10-26 DIAGNOSIS — I63431 Cerebral infarction due to embolism of right posterior cerebral artery: Secondary | ICD-10-CM | POA: Diagnosis not present

## 2017-10-26 DIAGNOSIS — Z933 Colostomy status: Secondary | ICD-10-CM | POA: Diagnosis not present

## 2017-10-26 DIAGNOSIS — K56600 Partial intestinal obstruction, unspecified as to cause: Secondary | ICD-10-CM | POA: Diagnosis present

## 2017-10-26 DIAGNOSIS — Z85820 Personal history of malignant melanoma of skin: Secondary | ICD-10-CM

## 2017-10-26 DIAGNOSIS — Z9049 Acquired absence of other specified parts of digestive tract: Secondary | ICD-10-CM

## 2017-10-26 DIAGNOSIS — H53462 Homonymous bilateral field defects, left side: Secondary | ICD-10-CM | POA: Diagnosis present

## 2017-10-26 DIAGNOSIS — Z882 Allergy status to sulfonamides status: Secondary | ICD-10-CM

## 2017-10-26 DIAGNOSIS — Z90710 Acquired absence of both cervix and uterus: Secondary | ICD-10-CM

## 2017-10-26 DIAGNOSIS — D72829 Elevated white blood cell count, unspecified: Secondary | ICD-10-CM | POA: Diagnosis not present

## 2017-10-26 DIAGNOSIS — Z859 Personal history of malignant neoplasm, unspecified: Secondary | ICD-10-CM | POA: Diagnosis not present

## 2017-10-26 DIAGNOSIS — N183 Chronic kidney disease, stage 3 (moderate): Secondary | ICD-10-CM | POA: Diagnosis not present

## 2017-10-26 DIAGNOSIS — Z923 Personal history of irradiation: Secondary | ICD-10-CM | POA: Diagnosis not present

## 2017-10-26 DIAGNOSIS — R112 Nausea with vomiting, unspecified: Secondary | ICD-10-CM

## 2017-10-26 DIAGNOSIS — R140 Abdominal distension (gaseous): Secondary | ICD-10-CM

## 2017-10-26 DIAGNOSIS — R29702 NIHSS score 2: Secondary | ICD-10-CM | POA: Diagnosis present

## 2017-10-26 HISTORY — DX: Disorder of kidney and ureter, unspecified: N28.9

## 2017-10-26 LAB — URINALYSIS, ROUTINE W REFLEX MICROSCOPIC
BILIRUBIN URINE: NEGATIVE
GLUCOSE, UA: NEGATIVE mg/dL
KETONES UR: NEGATIVE mg/dL
Leukocytes, UA: NEGATIVE
Nitrite: POSITIVE — AB
Protein, ur: 30 mg/dL — AB
Specific Gravity, Urine: 1.015 (ref 1.005–1.030)
pH: 6 (ref 5.0–8.0)

## 2017-10-26 LAB — COMPREHENSIVE METABOLIC PANEL
ALT: 21 U/L (ref 14–54)
AST: 27 U/L (ref 15–41)
Albumin: 4.3 g/dL (ref 3.5–5.0)
Alkaline Phosphatase: 84 U/L (ref 38–126)
Anion gap: 9 (ref 5–15)
BUN: 27 mg/dL — ABNORMAL HIGH (ref 6–20)
CHLORIDE: 111 mmol/L (ref 101–111)
CO2: 16 mmol/L — AB (ref 22–32)
Calcium: 9.2 mg/dL (ref 8.9–10.3)
Creatinine, Ser: 2.16 mg/dL — ABNORMAL HIGH (ref 0.44–1.00)
GFR, EST AFRICAN AMERICAN: 25 mL/min — AB (ref >60)
GFR, EST NON AFRICAN AMERICAN: 22 mL/min — AB (ref >60)
Glucose, Bld: 135 mg/dL — ABNORMAL HIGH (ref 65–99)
POTASSIUM: 3.7 mmol/L (ref 3.5–5.1)
SODIUM: 136 mmol/L (ref 135–145)
Total Bilirubin: 0.5 mg/dL (ref 0.3–1.2)
Total Protein: 8.5 g/dL — ABNORMAL HIGH (ref 6.5–8.1)

## 2017-10-26 LAB — CBC
HCT: 39.4 % (ref 36.0–46.0)
Hemoglobin: 13.4 g/dL (ref 12.0–15.0)
MCH: 31.1 pg (ref 26.0–34.0)
MCHC: 34 g/dL (ref 30.0–36.0)
MCV: 91.4 fL (ref 78.0–100.0)
PLATELETS: 329 10*3/uL (ref 150–400)
RBC: 4.31 MIL/uL (ref 3.87–5.11)
RDW: 15 % (ref 11.5–15.5)
WBC: 13.2 10*3/uL — ABNORMAL HIGH (ref 4.0–10.5)

## 2017-10-26 LAB — DIFFERENTIAL
BASOS PCT: 0 %
Basophils Absolute: 0 10*3/uL (ref 0.0–0.1)
EOS PCT: 0 %
Eosinophils Absolute: 0 10*3/uL (ref 0.0–0.7)
Lymphocytes Relative: 6 %
Lymphs Abs: 0.8 10*3/uL (ref 0.7–4.0)
MONO ABS: 0.6 10*3/uL (ref 0.1–1.0)
MONOS PCT: 5 %
NEUTROS ABS: 11.7 10*3/uL — AB (ref 1.7–7.7)
Neutrophils Relative %: 89 %

## 2017-10-26 LAB — URINALYSIS, MICROSCOPIC (REFLEX): WBC UA: NONE SEEN WBC/hpf (ref 0–5)

## 2017-10-26 LAB — PROTIME-INR
INR: 0.87
Prothrombin Time: 11.7 seconds (ref 11.4–15.2)

## 2017-10-26 LAB — RAPID URINE DRUG SCREEN, HOSP PERFORMED
AMPHETAMINES: NOT DETECTED
BARBITURATES: NOT DETECTED
BENZODIAZEPINES: NOT DETECTED
COCAINE: NOT DETECTED
Opiates: NOT DETECTED
TETRAHYDROCANNABINOL: NOT DETECTED

## 2017-10-26 LAB — TROPONIN I: Troponin I: <0.03 ng/mL (ref <0.03)

## 2017-10-26 LAB — LIPASE, BLOOD: LIPASE: 58 U/L — AB (ref 11–51)

## 2017-10-26 LAB — ETHANOL

## 2017-10-26 LAB — APTT: APTT: 24 s (ref 24–36)

## 2017-10-26 LAB — CBG MONITORING, ED: GLUCOSE-CAPILLARY: 128 mg/dL — AB (ref 65–99)

## 2017-10-26 MED ORDER — ALTEPLASE (STROKE) FULL DOSE INFUSION
0.9000 mg/kg | Freq: Once | INTRAVENOUS | Status: AC
  Administered 2017-10-26: 50 mg via INTRAVENOUS

## 2017-10-26 MED ORDER — ALTEPLASE (STROKE) FULL DOSE INFUSION
0.9000 mg/kg | Freq: Once | INTRAVENOUS | Status: DC
  Filled 2017-10-26: qty 100

## 2017-10-26 MED ORDER — IOPAMIDOL (ISOVUE-370) INJECTION 76%
125.0000 mL | Freq: Once | INTRAVENOUS | Status: AC | PRN
  Administered 2017-10-26: 125 mL via INTRAVENOUS

## 2017-10-26 MED ORDER — SODIUM CHLORIDE 0.9 % IV SOLN: 50.0000 mL | Freq: Once | INTRAVENOUS | Status: DC

## 2017-10-26 MED ORDER — SODIUM CHLORIDE 0.9 % IV SOLN
50.0000 mL | Freq: Once | INTRAVENOUS | Status: AC
  Administered 2017-10-26: 50 mL via INTRAVENOUS

## 2017-10-26 MED ORDER — STROKE: EARLY STAGES OF RECOVERY BOOK
Freq: Once | Status: DC
  Filled 2017-10-26 (×2): qty 1

## 2017-10-26 MED ORDER — ACETAMINOPHEN 325 MG PO TABS: 650.0000 mg | ORAL_TABLET | ORAL | Status: DC | PRN

## 2017-10-26 MED ORDER — SODIUM CHLORIDE 0.9 % IV BOLUS (SEPSIS)
1000.0000 mL | Freq: Once | INTRAVENOUS | Status: AC
  Administered 2017-10-26: 1000 mL via INTRAVENOUS

## 2017-10-26 MED ORDER — PANTOPRAZOLE SODIUM 40 MG IV SOLR
40.0000 mg | Freq: Every day | INTRAVENOUS | Status: DC
  Administered 2017-10-26: 40 mg via INTRAVENOUS
  Filled 2017-10-26 (×2): qty 40

## 2017-10-26 MED ORDER — ACETAMINOPHEN 650 MG RE SUPP: 650.0000 mg | RECTAL | Status: DC | PRN

## 2017-10-26 MED ORDER — ACETAMINOPHEN 160 MG/5ML PO SOLN: 650.0000 mg | ORAL | Status: DC | PRN

## 2017-10-26 MED ORDER — DIAZEPAM 5 MG/ML IJ SOLN
5.0000 mg | Freq: Once | INTRAMUSCULAR | Status: AC
  Administered 2017-10-26: 5 mg via INTRAVENOUS
  Filled 2017-10-26: qty 2

## 2017-10-26 MED ORDER — ALTEPLASE 100 MG IV SOLR
INTRAVENOUS | Status: AC
  Administered 2017-10-26: 50 mg via INTRAVENOUS
  Filled 2017-10-26: qty 1

## 2017-10-26 MED ORDER — SODIUM CHLORIDE 0.9 % IV SOLN
INTRAVENOUS | Status: DC
  Administered 2017-10-26 – 2017-10-27 (×3): via INTRAVENOUS

## 2017-10-26 MED ORDER — ONDANSETRON HCL 4 MG/2ML IJ SOLN
4.0000 mg | Freq: Once | INTRAMUSCULAR | Status: AC
  Administered 2017-10-26: 4 mg via INTRAVENOUS
  Filled 2017-10-26: qty 2

## 2017-10-26 NOTE — ED Notes (Signed)
Pt's 15 minute post TPA assessment done by [facility].  One assessment done at [facility] @ 1730.

## 2017-10-26 NOTE — H&P (Signed)
Neurology H&P  CC: Visual change  History is obtained from: Patient  HPI: Desiree Adams is a 72 y.o. female presenting with a right PCA infarct in the setting of partial small bowel obstruction.   She has been vomiting more than typical today, she has a history of chronic abdominal issue secondary to previous radiation she received at age 66 for ovarian cancer. Then, around 1 PM she developed acute left-sided hemi-anopia. She sought care in the emergency department or to neurology saw her and diagnosed acute stroke and treated her with IV TPA.  CT demonstrated a distal P2 occlusion, not felt to be an INR candidate. She was transported via EMS to the emergency department here where her symptoms had markedly improved following IV TPA.  She states that she vomited a lot earlier, and feels better from that standpoint as well now.   LKW: 1 PM tpa given?: Yes Modified Rankin Scale: 1-No significant post stroke disability and can perform usual duties with stroke symptoms  ROS: A 14 point ROS was performed and is negative except as noted in the HPI.   Past Medical History:  Diagnosis Date  . Anxiety   . Asthma    pt denies  . Biliary colic   . Carcinoid tumor, bronchus, lung, malignant ([place])   . Difficult intubation   . History of Clostridium difficile infection   . Other and unspecified noninfectious gastroenteritis and colitis(558.9) 01/2001   nonspecific colitis most likely from radiation  . Ovarian cancer ([place])   . PONV (postoperative nausea and vomiting)    always nausea and  vomiting  . Renal disorder   . Right lower lobe lung mass   . Small bowel obstruction ([place])   . Urinary tract infection   . Vitamin B12 deficiency      Family History  Problem Relation Age of Onset  . Kidney cancer Unknown        ???? Grandfather  . Bladder Cancer Maternal Grandfather   . Colon cancer Neg Hx      Social History:  reports that  has never smoked. she has never used smokeless  tobacco. She reports that she drinks alcohol. She reports that she does not use drugs.   Exam: Current vital signs: BP 114/65   Pulse 91   Temp 98.4 F (36.9 C) (Oral)   Resp (!) 25   Ht 5\' 4"  (1.626 m)   Wt 55.7 kg (122 lb 14.4 oz)   SpO2 96%   BMI 21.10 kg/m  Vital signs in last 24 hours: Temp:  [98 F (36.7 C)-98.4 F (36.9 C)] 98.4 F (36.9 C) (01/25 1911) Pulse Rate:  [77-97] 91 (01/25 2230) Resp:  [12-25] 25 (01/25 2230) BP: (99-129)/(59-77) 114/65 (01/25 2230) SpO2:  [85 %-100 %] 96 % (01/25 2230) Weight:  [55.7 kg (122 lb 14.4 oz)-56.2 kg (124 lb)] 55.7 kg (122 lb 14.4 oz) (01/25 1659)  Physical Exam  Constitutional: Appears well-developed and well-nourished.  Psych: Affect appropriate to situation Eyes: No scleral injection HENT: No OP obstrucion Head: Normocephalic.  Cardiovascular: Normal rate and regular rhythm.  Respiratory: Effort normal and breath sounds normal to anterior ascultation GI: Soft.  There is mild tenderness, colostomy in place. Skin: WDI  Neuro: Mental Status: Patient is awake, alert, oriented to person, place, month, year, and situation. Patient is able to give a clear and coherent history. No signs of aphasia or neglect Cranial Nerves: II: Visual Fields are full. Pupils are equal, round, and reactive to  light.   III,IV, VI: EOMI without ptosis or diploplia.  V: Facial sensation is symmetric to temperature VII: Facial movement is symmetric.  VIII: hearing is intact to voice X: Uvula elevates symmetrically XI: Shoulder shrug is symmetric. XII: tongue is midline without atrophy or fasciculations.  Motor: Tone is normal. Bulk is normal. 5/5 strength was present in all four extremities.  Sensory: Sensation is symmetric to light touch and temperature in the arms and legs. 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 She is no longer having any nausea or vomiting.   She may need discussion for partial small bowel dissection with internal medicine and/or surgery tomorrow, but for now she needs to be nothing by mouth. With her symptoms improving, I don't think an NG tube is necessary at this time.  Recommendations: 1. HgbA1c, fasting lipid panel 2. MRI of the brain without contrast 3. Frequent neuro checks 4. Echocardiogram 7. Prophylactic therapy-none for 24 hours 7. Risk factor modification 8. Telemetry monitoring 9. PT consult, OT consult, Speech consult 10. please page stroke NP  Or  PA  Or MD  from 8am -4 pm as this patient will be followed by the stroke team at this point.   You can look them up on [email or web address]     Roland Rack, MD [facility] [phone]  If 7pm- 7am, please page neurology on call as listed in [place]. 10/26/2017  10:37 PM

## 2017-10-26 NOTE — ED Notes (Signed)
Hospital ordered for patient

## 2017-10-26 NOTE — ED Notes (Signed)
Transport cancelled per EDP and tele neurologist.

## 2017-10-26 NOTE — ED Provider Notes (Signed)
[place] EMERGENCY DEPARTMENT Provider Note   CSN: [id number] Arrival date & time: 10/26/17  1355     History   Chief Complaint Chief Complaint  Patient presents with  . Code Stroke    HPI Desiree Adams is a 72 y.o. female.  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   Lost partial vision in left eye, 1.5 hr ago. No numbness/weakness on one side or other. Side of vision left side on both eyes gone.  Nauseated, lightheaded, dizzy Throwing up most of the day. Thought it felt like prior bowel obstructions. Has history of similar, multiple bowel obstructions.  Reports yesterday began to have some mild cramping and then this morning woke up and vomited 10-15 times. Was throwing up all morning then suddenly at 1PM developed loss of vision on the left side of her vision.  No headache, no other neurologic symptoms. No speech problems.  Feels like abdominal distention is better now, feels like it is resolving.  "I vomited so much that now I feel better."    Past Medical History:  Diagnosis Date  . Anxiety   . Asthma    pt denies  . Biliary colic   . Carcinoid tumor, bronchus, lung, malignant ([place])   . Difficult intubation   . History of Clostridium difficile infection   . Other and unspecified noninfectious gastroenteritis and colitis(558.9) 01/2001   nonspecific colitis most likely from radiation  . Ovarian cancer ([place])   . PONV (postoperative nausea and vomiting)    always nausea and   vomiting  . Renal disorder   . Right lower lobe lung mass   . Small bowel obstruction ([place])   . Urinary tract infection   . Vitamin B12 deficiency     Patient Active Problem List   Diagnosis Date Noted  . SBO (small bowel obstruction) ([place]) 10/24/2016  . Melanoma of anal canal ([place]) 06/18/2013  . Thrombosed external hemorrhoid-right 10/28/2012  . Right lower lobe lung mass   . 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 Rectal ulcer 12/25/2011  . 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 VITAMIN B12 DEFICIENCY 11/11/2008  . BILIARY COLIC 17/61/6073  . DIARRHEA, CHRONIC 11/11/2008  . NEOPLASM, MALIGNANT, OVARY, HX OF 11/11/2008  . Personal history of other diseases of digestive system 11/11/2008    Past Surgical History:  Procedure Laterality Date  . APPENDECTOMY    . BREAST ENHANCEMENT SURGERY    . COLONOSCOPY  12/25/2011   Procedure: COLONOSCOPY;  Surgeon: Lafayette Dragon, MD;  Location: [facility];  Service: Endoscopy;  Laterality: N/A;  . EXPLORATORY LAPAROTOMY W/ BOWEL RESECTION    . HEMORRHOID SURGERY N/A 06/04/2013   Procedure: single coulum HEMORRHOIDECTOMY;  Surgeon: Odis Hollingshead, MD;  Location: [facility] ORS;  Service: General;  Laterality: N/A;  . LAPAROSCOPIC CHOLECYSTECTOMY    . LUNG REMOVAL, PARTIAL    . TOTAL ABDOMINAL HYSTERECTOMY W/ BILATERAL SALPINGOOPHORECTOMY    . VIDEO ASSISTED THORACOSCOPY  04/25/12   right    OB History    No data available       Home Medications  Prior to Admission medications   Medication Sig Start Date End Date Taking? Authorizing Provider  ALPRAZolam (XANAX) 0.25 MG tablet Take 0.125-0.25 mg by mouth at bedtime as needed for sleep.    [provider]  amoxicillin-clavulanate (AUGMENTIN) 875-125 MG per tablet Take 1 tablet by mouth 2 (two) times daily.  12/10/14   [provider]  cephALEXin (KEFLEX) 500 MG capsule Take 2 capsules (1,000 mg total) by mouth 2 (two) times daily. 05/23/16   Orpah Greek, MD    cyanocobalamin (,VITAMIN B-12,) 1000 MCG/ML injection Inject 1 ml SQ in deltoid once monthly. Pharmacy-please include 3 ml syringes and 1" 25 gauge needles. 06/12/13   Lafayette Dragon, MD  metoCLOPramide (REGLAN) 10 MG tablet Take 1 tablet (10 mg total) by mouth every 8 (eight) hours as needed for nausea or vomiting (nausea/headache). 09/07/15   Orlie Dakin, MD  Multiple Vitamin (MULTIVITAMIN WITH MINERALS) TABS tablet Take 1 tablet by mouth daily.    [provider]  ondansetron (ZOFRAN) 4 MG tablet Take 1 tablet (4 mg total) by mouth every 8 (eight) hours as needed for nausea or vomiting. 10/25/16   Tegeler, [name], MD    Family History Family History  Problem Relation Age of Onset  . Kidney cancer Unknown        ???? Grandfather  . Bladder Cancer Maternal Grandfather   . Colon cancer Neg Hx     Social History Social History   Tobacco Use  . Smoking status: Never Smoker  . Smokeless tobacco: Never Used  Substance Use Topics  . Alcohol use: Yes    Comment: very rarely  . Drug use: No     Allergies   Sulfamethoxazole   Review of Systems Review of Systems  Constitutional: Negative for fever.  HENT: Negative for sore throat.   Eyes: Positive for visual disturbance.  Respiratory: Negative for cough and shortness of breath.   Cardiovascular: Negative for chest pain.  Gastrointestinal: Positive for abdominal distention, abdominal pain, nausea and vomiting. Negative for constipation and diarrhea (ostomy).  Genitourinary: Negative for difficulty urinating.  Musculoskeletal: Negative for back pain and neck pain.  Skin: Negative for rash.  Neurological: Negative for syncope, facial asymmetry, speech difficulty, weakness, numbness and headaches.     Physical Exam Updated Vital Signs BP 123/68   Pulse 77   Temp 98.1 F (36.7 C)   Resp (!) 21   Ht 5\' 4"  (1.626 m)   Wt 55.7 kg (122 lb 14.4 oz)   SpO2 100%   BMI 21.10 kg/m   Physical Exam   Constitutional: She is oriented to person, place, and time. She appears well-developed and well-nourished. No distress.  HENT:  Head: Normocephalic and atraumatic.  Eyes: Conjunctivae and EOM are normal.  Neck: Normal range of motion.  Cardiovascular: Normal rate, regular rhythm, normal heart sounds and intact distal pulses. Exam reveals no gallop and no friction rub.  No murmur heard. Pulmonary/Chest: Effort normal and breath sounds normal. No respiratory distress. She has no wheezes. She has no rales.  Abdominal: Soft. She exhibits no distension. There is no tenderness. There is no guarding.  Musculoskeletal: She exhibits no edema or tenderness.  Neurological: She is alert and oriented to person, place, and time. She has normal strength. No cranial nerve deficit or sensory deficit. Coordination and gait normal. GCS eye subscore is 4. GCS verbal subscore is 5. GCS motor subscore is 6.  Left visual field cut, greater than 50% vision loss left side  visual field  Skin: Skin is warm and dry. No rash noted. She is not diaphoretic. No erythema.  Nursing note and vitals reviewed.    ED Treatments / Results  Labs (all labs ordered are listed, but only abnormal results are displayed) Labs Reviewed  LIPASE, BLOOD - Abnormal; Notable for the following components:      Result Value   Lipase 58 (*)    All other components within normal limits  COMPREHENSIVE METABOLIC PANEL - Abnormal; Notable for the following components:   CO2 16 (*)    Glucose, Bld 135 (*)    BUN 27 (*)    Creatinine, Ser 2.16 (*)    Total Protein 8.5 (*)    GFR calc non Af Amer 22 (*)    GFR calc Af Amer 25 (*)    All other components within normal limits  CBC - Abnormal; Notable for the following components:   WBC 13.2 (*)    All other components within normal limits  URINALYSIS, ROUTINE W REFLEX MICROSCOPIC - Abnormal; Notable for the following components:   APPearance CLOUDY (*)    Hgb urine dipstick SMALL (*)     Protein, ur 30 (*)    Nitrite POSITIVE (*)    All other components within normal limits  DIFFERENTIAL - Abnormal; Notable for the following components:   Neutro Abs 11.7 (*)    All other components within normal limits  CBG MONITORING, ED - Abnormal; Notable for the following components:   Glucose-Capillary 128 (*)    All other components within normal limits  ETHANOL  PROTIME-INR  APTT  TROPONIN I  RAPID URINE DRUG SCREEN, HOSP PERFORMED  URINALYSIS, MICROSCOPIC (REFLEX)    EKG  EKG Interpretation None       Radiology Ct Angio Head W Or Wo Contrast  Result Date: 10/26/2017 CLINICAL DATA:  Left homonymous hemianopia. EXAM: CT ANGIOGRAPHY HEAD AND NECK TECHNIQUE: Multidetector CT imaging of the head and neck was performed using the standard protocol during bolus administration of intravenous contrast. Multiplanar CT image reconstructions and MIPs were obtained to evaluate the vascular anatomy. Carotid stenosis measurements (when applicable) are obtained utilizing NASCET criteria, using the distal internal carotid diameter as the denominator. CONTRAST:  192mL ISOVUE-370 IOPAMIDOL (ISOVUE-370) INJECTION 76% COMPARISON:  None. FINDINGS: CTA NECK FINDINGS Aortic arch: See chest CTA.  There is an ascending aortic aneurysm. Right carotid system: No noted atheromatous changes. Mild tortuosity. No stenosis or ulceration. Negative for dissection. Left carotid system: No noted atheromatous changes. No stenosis, ulceration, or dissection. Vertebral arteries: No proximal subclavian stenosis or atherosclerosis. Slight right vertebral artery dominance. Both vertebral arteries are widely patent to the dura. Best seen on coronal reformats there is bilateral V3 segment beading, greater on the left. Skeleton: No acute or aggressive finding. Cervical spine degeneration. Other neck: No incidental mass or adenopathy. Microcalcifications in the left more than right parotids. The submandibular ducts are atrophic  and also heterogeneous. Questions Sjogren's syndrome. Upper chest: Reported separately Review of the MIP images confirms the above findings CTA HEAD FINDINGS Anterior circulation: No noted atheromatous changes. Left A1 hypoplasia. No branch occlusion, beading, or aneurysm. Posterior circulation: Slight right vertebral artery dominance. The vertebral and basilar arteries are smooth and diffusely patent. There is a hypoplastic left P1 segment with robust posterior communicating artery. Distal right P2, nearly P3, branch occlusion with abrupt cut off. There is some distal branch reconstitution. Negative for aneurysm. Venous sinuses: Negative Anatomic variants: As above Delayed phase: No abnormal intracranial  enhancement no visible PCA distribution infarct. Critical Value/emergent results were called by telephone at the time of interpretation on 10/26/2017 at 4:45 pm to Dr. Gareth Morgan , who verbally acknowledged these results. Review of the MIP images confirms the above findings IMPRESSION: 1. Right distal P2 occlusion with downstream reconstitution. No visible infarct. 2. No noted atheromatous changes in the head and neck. 3. Subtle beading of bilateral V3 segments, possible mild fibromuscular dysplasia. No superimposed dissection seen to specifically explain the presumed PCA embolus. 4. Salivary gland changes suspicious for Sjogren's syndrome. Electronically Signed   By: Monte Fantasia M.D.   On: 10/26/2017 16:57   Ct Angio Neck W And/or Wo Contrast  Result Date: 10/26/2017 CLINICAL DATA:  Left homonymous hemianopia. EXAM: CT ANGIOGRAPHY HEAD AND NECK TECHNIQUE: Multidetector CT imaging of the head and neck was performed using the standard protocol during bolus administration of intravenous contrast. Multiplanar CT image reconstructions and MIPs were obtained to evaluate the vascular anatomy. Carotid stenosis measurements (when applicable) are obtained utilizing NASCET criteria, using the distal internal  carotid diameter as the denominator. CONTRAST:  179mL ISOVUE-370 IOPAMIDOL (ISOVUE-370) INJECTION 76% COMPARISON:  None. FINDINGS: CTA NECK FINDINGS Aortic arch: See chest CTA.  There is an ascending aortic aneurysm. Right carotid system: No noted atheromatous changes. Mild tortuosity. No stenosis or ulceration. Negative for dissection. Left carotid system: No noted atheromatous changes. No stenosis, ulceration, or dissection. Vertebral arteries: No proximal subclavian stenosis or atherosclerosis. Slight right vertebral artery dominance. Both vertebral arteries are widely patent to the dura. Best seen on coronal reformats there is bilateral V3 segment beading, greater on the left. Skeleton: No acute or aggressive finding. Cervical spine degeneration. Other neck: No incidental mass or adenopathy. Microcalcifications in the left more than right parotids. The submandibular ducts are atrophic and also heterogeneous. Questions Sjogren's syndrome. Upper chest: Reported separately Review of the MIP images confirms the above findings CTA HEAD FINDINGS Anterior circulation: No noted atheromatous changes. Left A1 hypoplasia. No branch occlusion, beading, or aneurysm. Posterior circulation: Slight right vertebral artery dominance. The vertebral and basilar arteries are smooth and diffusely patent. There is a hypoplastic left P1 segment with robust posterior communicating artery. Distal right P2, nearly P3, branch occlusion with abrupt cut off. There is some distal branch reconstitution. Negative for aneurysm. Venous sinuses: Negative Anatomic variants: As above Delayed phase: No abnormal intracranial enhancement no visible PCA distribution infarct. Critical Value/emergent results were called by telephone at the time of interpretation on 10/26/2017 at 4:45 pm to Dr. Gareth Morgan , who verbally acknowledged these results. Review of the MIP images confirms the above findings IMPRESSION: 1. Right distal P2 occlusion with  downstream reconstitution. No visible infarct. 2. No noted atheromatous changes in the head and neck. 3. Subtle beading of bilateral V3 segments, possible mild fibromuscular dysplasia. No superimposed dissection seen to specifically explain the presumed PCA embolus. 4. Salivary gland changes suspicious for Sjogren's syndrome. Electronically Signed   By: Monte Fantasia M.D.   On: 10/26/2017 16:57   Ct Head Code Stroke Wo Contrast  Result Date: 10/26/2017 CLINICAL DATA:  Code stroke. Nausea and vomiting today. Blurred vision today in the left greater than right eye. EXAM: CT HEAD WITHOUT CONTRAST TECHNIQUE: Contiguous axial images were obtained from the base of the skull through the vertex without intravenous contrast. COMPARISON:  None. FINDINGS: Brain: No evidence of acute infarction, hemorrhage, hydrocephalus, extra-axial collection or mass effect. Normal brain volume for age. Ossification along the dura of the right frontal convexity,  small incidental. 6mm pineal cyst. Vascular: No hyperdense vessel or unexpected calcification. Skull: Negative Sinuses/Orbits: Negative Other: These results were called by telephone at the time of interpretation on 10/26/2017 at 2:43 pm to Dr. Gareth Morgan , who verbally acknowledged these results. ASPECTS [facility] Stroke Program Early CT Score) Not scored with this history. IMPRESSION: No acute finding or explanation for symptoms. Electronically Signed   By: Monte Fantasia M.D.   On: 10/26/2017 14:53    Procedures .Critical Care Performed by: Gareth Morgan, MD Authorized by: Gareth Morgan, MD   Critical care provider statement:    Critical care time (minutes):  90   Critical care time was exclusive of:  Separately billable procedures and treating other patients and teaching time   Critical care was necessary to treat or prevent imminent or life-threatening deterioration of the following conditions:  CNS failure or compromise   Critical care was time spent  personally by me on the following activities:  Obtaining history from patient or surrogate, examination of patient, evaluation of patient's response to treatment, discussions with consultants, ordering and review of laboratory studies, ordering and review of radiographic studies and re-evaluation of patient's condition   (including critical care time)  Medications Ordered in ED Medications  alteplase (ACTIVASE) 1 mg/mL infusion 50 mg (50 mg Intravenous New Bag/Given 10/26/17 1706)    Followed by  0.9 %  sodium chloride infusion (not administered)  sodium chloride 0.9 % bolus 1,000 mL (0 mLs Intravenous Stopped 10/26/17 1646)  sodium chloride 0.9 % bolus 1,000 mL (1,000 mLs Intravenous New Bag/Given 10/26/17 1646)  diazepam (VALIUM) injection 5 mg (5 mg Intravenous Given 10/26/17 1508)  iopamidol (ISOVUE-370) 76 % injection 125 mL (125 mLs Intravenous Contrast Given 10/26/17 1606)     Initial Impression / Assessment and Plan / ED Course  I have reviewed the triage vital signs and the nursing notes.  Pertinent labs & imaging results that were available during my care of the patient were reviewed by me and considered in my medical decision making (see chart for details).     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   Given patient's left-sided visual field cut, called code stroke.  CT noncon head showed no acute findings, and tele-neurology physician Dr. Sheppard Coil evaluated patient, however given patient's blood pressure  of 99 on arrival, also concerns of abdominal pain, nausea and vomiting, recommended CT dissection study prior to proceeding with TPA.  Patient with chronic renal disease, and discussed that the contrast needed in order to complete a dissection study would be potentially more damaging to her kidneys--she consents to the scan.  Feel at this time risks of dissection, as well as this need to rule out dissection and evaluate for signs of acute stroke outweigh the potential risks of contrast administration.  There was a delay in obtaining the scan, given patient concern regarding bilateral leg cramping for which she would not proceed with further testing unless she received medications.  She was given Valium, and scan was completed.  Discussed with Dr. Collins Scotland, of radiatiology and given concern for symptoms consistent with acute stroke with  left homonymous hemianopsia, with patient still in the window of either TPA or intervention, will proceed with CT, with plan to do CT Angio of head and neck which will evaluate the aortic arch for signs of dissection in this area and proximal dissection which we would expect to cause neurologic symptoms, and continue the scan through the rest of the chest abdomen and pelvis given pain in these regions.  We are unable to provide multiple contrast boluses to patient to obtain both CTA head and neck and CTA dissection study, however feel that at this time obtaining the CTA head/neck which will capture proximal dissection and look for vessel occlusion is most appropriate--and will continue scan through the chest, abdomen and pelvis.  Pt initially reports visual symptoms improving but noted to have dense defect on exam, and then she reports her visual symptoms are worsening again.    Discussed with Dr. Pascal Lux CT angio results which are concerning for right P2 occlusion, likely too proximal for intervention. Told tele-neurology nurse who was contacting Dr. Sheppard Coil again.  While they were  attempting to get Dr. Sheppard Coil back online, discussed risks/benefits of tPA with patient and proceeded with ordering the medications. Discussed with Dr. Rory Percy of Neurology and will transfer. He will review CT with Dr. Pascal Lux and contact intervention if indicated.   Dr. Sheppard Coil again on tele neuro with patient, recommending weighing patient and decreasing dose of tPA. (pt reports 124lb, is 122lb) tPA infusing without side effects. Discussed again with Dr. Rory Percy, and given Neuro ICU beds full will transfer to [facility] ED for further evaluation as she awaits Neuro ICU bed. Dr. Dayna Barker accepting the patient.    CT chest/abd/pelvis that was completed shows partial small bowel obstruction of indeterminate chronicity.  Her symptoms certainly this AM were consistent with this, however she reports improved distention, no nausea, no vomiting in ED.  Recommend patient stay npo. Would consider surgical consult if symptoms recur, however in setting of tPA for acute CVA which I feel risk of is greater in setting of patient's resolved obstruction symptoms and equivocal scan, do not feel acute surgical consult is indicated and would not recommend this or ng in this setting. Discussed with patient and told her to tell us as soon as she has symptoms of nausea.  Discussed other incidental findings with patient including aneurysm and recommend follow up with per PCP.    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

## 2017-10-26 NOTE — ED Notes (Signed)
ED Provider at bedside. 

## 2017-10-26 NOTE — ED Notes (Signed)
Pt discussing risks/benefits of TPA with teleneurologist

## 2017-10-26 NOTE — Consult Note (Signed)
[facility] TeleNeurology Consult Services  Impression:  Patient with N/V/Abd discomfort and left homonymous hemianopia.  Aortic dissection needed to be ruled out given her abd pain, N/V and hypotension.  CTA chest/abd/pel did not show dissection.  CTA head/neck was done at that time showing right P2 occlusion, which is congruent with symptoms. I reviewed the clinical situation with the patient, namely concern for right occipital stroke.  I reviewed the risks/benefits/alternatives to tPA and the patient consented to tPA. She will be transferred to [facility] for post-tPA care and for [facility] evaluation - Dr. Billy Fischer has already contacted [facility] and [facility] aware of patient.    Differential Diagnosis:  1. Cardioembolic stroke  2. Small vessel disease/lacune  3. Thromboembolic, artery-to-artery mechanism  4. Hypercoagulable state-related infarct  5. Transient ischemic attack  6. Thrombotic mechanism, large artery disease   Comments:   Door time:  1355 [employer] contacted: [phone] [employer] at bedside: 1437 NIHSS assessment time: 1442 Verbal tpa order: 0626 (delay due to needing to rule out aortic dissection with CTA - radiology needed to approve study with her reduced GFR) Pre-tPA BP: 114/57 Finger stick glucose: 128 Needle time: 1706  Verbal Consent to tPA:  I have explained to the patient/family/guardian the nature of the patient's condition, the use of tPA fibrinolytic agent, and the benefits to be reasonably expected compared with alternative approaches. I have discussed the likelihood of major risks or complications of this procedure including (if applicable) but not limited to loss of limb function, brain damage, paralysis, hemorrhage, infection, complications from transfusion of blood components, drug reactions, blood clots and loss of life. I have also indicated that with any procedure there is always the possibility of an unexpected complication. I have explained  the risks which include:    1. Death, Stroke or permanent neurologic injury (paralysis, coma, etc)   2. Worsening of stroke symptoms from swelling or bleeding in the brain   3. Bleeding in other parts of the body   4. Need for blood transfusions to replace blood or clotting factors   5. Allergic reaction to medications   6. Other unexpected complications    All questions were answered and the patient/family/guardian express understanding of the treatment plan and consent to the procedure.  Our recommendations are outlined below.   We will be seeing the patient back in follow up as noted.    Recommendations:   IV tPA - dose = 5mg  bolus, 45mg  infusion (weight 55.7kg by scale) Routine post tPA monitoring including neuro checks and blood pressure control during/after treatment Monitor blood pressure Check blood pressure and NIHSS every 15 min for 2 h, then every 30 min for 6 h, and finally every hour for 16 h  Systolic greater than 948 OR diastolic greater than 546: Option 1: Labetalol 10 mg IV for 1 - 2 min May repeat or double labetalol every 10 min to maximum dose of 300 mg, or give initial labetalol dose, then start labetalol drip at 2 - 8 mg/min. Option 2: Nicardipine 5 mg/h IV infusion as initial dose and titrate to desired effect by increasing 2.5 mg/h every 5 min to maximum of 15 mg/h;  If blood pressure is not controlled by labetolol or nicardipine, consider sodium nitroprusside.  Admission to ICU - being transferred to [facility] CT brain 24 hours post tPA NPO until swallowing screen performed and passed No antiplatelet agents or anticoagulants (including heparin for DVT prophylaxis) in first 24 hours No Foley catheter, nasogastric tube, arterial  catheter or central venous catheter for 24 hr, unless absolutely necessary Telemetry  Inpatient Neurology Consultation Stroke evaluation as per inpatient neurology recommendations Discussed with ED  MD   ------------------------------------------------------------------------------  CC: stroke alert  History of Present Illness  Patient is a 72 year old woman with a history of carcinoid tumor, ovarian cancer, colectomy who presented to the ED with nausea, vomiting, abdominal cramps and vision change.  She woke this morning with N/V/abd discomfort.  She thought she was having a recurrent SBO, as this is usually how it presents.  However, around 1300 she noted abrupt onset of vision changes to the left.  She noted it was in both eyes.  No weakness, sensory changes, speech/langauge changes, HA, CP.  She was hypotensive on presentation with SBP of 99 and remained in 100s on intake.  Diagnostic CT head wo - nothing acute CTA head/neck - right P2 occlusion with distal reconstitution CTA chest/abd/pel - no aortic dissection  Exam  NIHSS score: 1a LOC: 0 1b Questions: 0  1c Commands: 0  2 Gaze: 0  3 VF: 2 4 Face: 0  5a Motor arm left: 0  5b Motor arm right: 0  6a Motor leg left: 0  6b Motor leg right: 0  7 Ataxia: 0  8 Sensory: 0  9: Language: 0  10: Speech: 0  11: Extinction: 0   Medical Decision Making:  - Extensive number of diagnosis or management options are considered above.   - Extensive amount of complex data reviewed.   - High risk of complication and/or morbidity or mortality are associated with differential diagnostic considerations above.  - There may be Uncertain outcome and increased probability of prolonged functional impairment or high probability of severe prolonged functional impairment associated with some of these differential diagnosis.  Medical Data Reviewed:  1.Data reviewed include clinical labs, radiology,  Medical Tests;   2.Tests results discussed w/performing or interpreting physician;   3.Obtaining/reviewing old medical records;  4.Obtaining case history from another source;  5.Independent review of image, tracing or specimen.   Medical Decision  Making:  - Extensive number of diagnosis or management options are considered above.   - Extensive amount of complex data reviewed.   - High risk of complication and/or morbidity or mortality are associated with differential diagnostic considerations above.  - There may be Uncertain outcome and increased probability of prolonged functional impairment or high probability of severe prolonged functional impairment associated with some of these differential diagnosis.  Medical Data Reviewed:  1.Data reviewed include clinical labs, radiology,  Medical Tests;   2.Tests results discussed w/performing or interpreting physician;   3.Obtaining/reviewing old medical records;  4.Obtaining case history from another source;  5.Independent review of image, tracing or specimen.    Patient was informed the Neurology Consult would happen via telehealth (remote video) and consented to receiving care in this manner.

## 2017-10-26 NOTE — ED Provider Notes (Signed)
7:18 PM Desiree Adams is a 72 y.o. 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  According to documentation, patient was seen at [facility] earlier today for nausea, vomiting, abdominal pain, and vision changes.  Patient reported that her left vision was acutely abnormal.  Patient was made a code stroke and received TPA at the direction of the neurology team.  After TPA, patient reports that her vision is drastically improving.  She reports that there is only minimal blurriness on the left vision compared to prior.  Next  Patient also had CT imaging showing concern for possible partial small bowel obstruction.  Patient reports her nausea vomiting abdominal pain have improved after fluids and nausea medicine.  After patient's arrival, neurology will be called for admission.   Neurology will admit patient for further management of.  Of note, patient had infiltration of her right arm IV which was infusing the TPA.  It was stopped and it was switched to other side.  Hematoma was marked and was not causing patient significant discomfort.  No focal neurologic deficits in the arm after infiltration.  Patient will be admitted for further management.  Patient reported that her vision changes were improving.    Clinical Impression: 1. 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 Left homonymous hemianopsia   3. Nausea and vomiting, intractability of vomiting not specified, unspecified vomiting type   4. Abdominal distention   5. Partial small bowel obstruction ([facility])     Disposition: Admit  This note was prepared with assistance of Dragon voice recognition software. Occasional  wrong-word or sound-a-like substitutions may have occurred due to the inherent limitations of voice recognition software.       Desiree Adams, [name], MD 10/27/17 [phone]

## 2017-10-26 NOTE — ED Triage Notes (Signed)
PResents with Hx of bowel obstruction and vision loss in her left eye, she has vomited about 10-15 times today. States it feels like a bowel obstruction. She c/o abdominal pain as well. The vision loss started this AM after vomiting.

## 2017-10-27 ENCOUNTER — Inpatient Hospital Stay (HOSPITAL_COMMUNITY): Payer: Medicare HMO

## 2017-10-27 ENCOUNTER — Encounter (HOSPITAL_COMMUNITY): Payer: Self-pay | Admitting: *Deleted

## 2017-10-27 DIAGNOSIS — I63431 Cerebral infarction due to embolism of right posterior cerebral artery: Principal | ICD-10-CM

## 2017-10-27 DIAGNOSIS — N183 Chronic kidney disease, stage 3 (moderate): Secondary | ICD-10-CM

## 2017-10-27 DIAGNOSIS — D72829 Elevated white blood cell count, unspecified: Secondary | ICD-10-CM

## 2017-10-27 DIAGNOSIS — E781 Pure hyperglyceridemia: Secondary | ICD-10-CM

## 2017-10-27 DIAGNOSIS — I361 Nonrheumatic tricuspid (valve) insufficiency: Secondary | ICD-10-CM

## 2017-10-27 LAB — HEMOGLOBIN A1C
Hgb A1c MFr Bld: 5.2 % (ref 4.8–5.6)
MEAN PLASMA GLUCOSE: 102.54 mg/dL

## 2017-10-27 LAB — LIPID PANEL
CHOLESTEROL: 142 mg/dL (ref 0–200)
HDL: 41 mg/dL (ref >40)
LDL Cholesterol: 54 mg/dL (ref 0–99)
TRIGLYCERIDES: 234 mg/dL — AB (ref <150)
Total CHOL/HDL Ratio: 3.5 RATIO
VLDL: 47 mg/dL — ABNORMAL HIGH (ref 0–40)

## 2017-10-27 LAB — MRSA PCR SCREENING: MRSA BY PCR: POSITIVE — AB

## 2017-10-27 LAB — ECHOCARDIOGRAM COMPLETE
Height: 64 in
Weight: 1966.4 oz

## 2017-10-27 MED ORDER — EZETIMIBE 10 MG PO TABS
10.0000 mg | ORAL_TABLET | Freq: Every day | ORAL | Status: DC
  Administered 2017-10-27 – 2017-10-28 (×2): 10 mg via ORAL
  Filled 2017-10-27 (×2): qty 1

## 2017-10-27 MED ORDER — ONDANSETRON HCL 4 MG PO TABS: 4.0000 mg | ORAL_TABLET | Freq: Three times a day (TID) | ORAL | Status: DC | PRN

## 2017-10-27 MED ORDER — ASPIRIN EC 325 MG PO TBEC
325.0000 mg | DELAYED_RELEASE_TABLET | Freq: Every day | ORAL | Status: DC
  Administered 2017-10-27 – 2017-10-28 (×2): 325 mg via ORAL
  Filled 2017-10-27 (×2): qty 1

## 2017-10-27 MED ORDER — PANTOPRAZOLE SODIUM 40 MG PO TBEC
40.0000 mg | DELAYED_RELEASE_TABLET | Freq: Every day | ORAL | Status: DC
  Administered 2017-10-27 – 2017-10-28 (×2): 40 mg via ORAL
  Filled 2017-10-27 (×2): qty 1

## 2017-10-27 NOTE — Progress Notes (Addendum)
STROKE TEAM PROGRESS NOTE   SUBJECTIVE (INTERVAL HISTORY) No family is at the bedside.  Patient left hemianopia much improved, still complains of far left peripheral vision loss.  Vomiting resolved.  Put on clear liquid diet.  MRI showed right PCA infarct.  Patient denies any cardiac issue.  May need TEE and loop.  OBJECTIVE Temp:  [98.1 F (36.7 C)-99 F (37.2 C)] 99 F (37.2 C) (01/26 1631) Pulse Rate:  [65-92] 91 (01/26 1600) Cardiac Rhythm: Normal sinus rhythm (01/26 0900) Resp:  [12-27] 27 (01/26 1600) BP: (106-129)/(55-77) 112/57 (01/26 1500) SpO2:  [85 %-100 %] 95 % (01/26 1600) Weight:  [122 lb 14.4 oz (55.7 kg)] 122 lb 14.4 oz (55.7 kg) (01/25 1659)  CBC:  Recent Labs  Lab 10/26/17 1431  WBC 13.2*  NEUTROABS 11.7*  HGB 13.4  HCT 39.4  MCV 91.4  PLT 536    Basic Metabolic Panel:  Recent Labs  Lab 10/26/17 1431  NA 136  K 3.7  CL 111  CO2 16*  GLUCOSE 135*  BUN 27*  CREATININE 2.16*  CALCIUM 9.2    Lipid Panel:     Component Value Date/Time   CHOL 142 10/27/2017 0440   TRIG 234 (H) 10/27/2017 0440   HDL 41 10/27/2017 0440   CHOLHDL 3.5 10/27/2017 0440   VLDL 47 (H) 10/27/2017 0440   LDLCALC 54 10/27/2017 0440   HgbA1c:  Lab Results  Component Value Date   HGBA1C 5.2 10/27/2017   Urine Drug Screen:     Component Value Date/Time   LABOPIA NONE DETECTED 10/26/2017 1720   COCAINSCRNUR NONE DETECTED 10/26/2017 1720   LABBENZ NONE DETECTED 10/26/2017 1720   AMPHETMU NONE DETECTED 10/26/2017 1720   THCU NONE DETECTED 10/26/2017 1720   LABBARB NONE DETECTED 10/26/2017 1720    Alcohol Level     Component Value Date/Time   ETH <10 10/26/2017 1431    IMAGING I have personally reviewed the radiological images below and agree with the radiology interpretations.  Ct Angio Head W Or Wo Contrast Ct Angio Neck W And/or Wo Contrast 10/26/2017 IMPRESSION:  1. Right distal P2 occlusion with downstream reconstitution. No visible infarct.  2. No noted  atheromatous changes in the head and neck.  3. Subtle beading of bilateral V3 segments, possible mild fibromuscular dysplasia. No superimposed dissection seen to specifically explain the presumed PCA embolus.  4. Salivary gland changes suspicious for Sjogren's syndrome.   Ct Angio Chest/abd/pel For Dissection W And/or Wo Contrast 10/26/2017 IMPRESSION:  1. No evidence of acute aortic syndrome.  2. History of anal melanoma with proctectomy and descending colostomy. There is also ileocolic anastomosis. Dilated small bowel in the lower abdomen with fecalized contents/slow transit, findings of partial obstruction. This is of indeterminate chronicity given this patient presented with stroke symptoms.  3. Ascending aortic aneurysm, 4.2 cm in diameter. Recommend annual imaging followup by CTA or MRA. This recommendation follows 2010 ACCF/AHA/AATS/ACR/ASA/SCA/SCAI/SIR/STS/SVM Guidelines for the Diagnosis and Management of Patients with Thoracic Aortic Disease. Circulation. 2010; 121: R443-X540 4. Bilateral renal scarring.  5. Extracapsular breast implant rupture on the right.   Ct Head Code Stroke Wo Contrast 10/26/2017 IMPRESSION:  No acute finding or explanation for symptoms.   MR Brain Wo Contrast Patchy acute cortical infarction in the right occipital lobe. Small acute right posterior thalamus infarct.  Transthoracic Echocardiogram - Left ventricle: The cavity size was normal. Systolic function was   normal. The estimated ejection fraction was in the range of 60%   to 65%.  Wall motion was normal; there were no regional wall   motion abnormalities. - Aortic valve: Trileaflet; normal thickness, mildly calcified   leaflets. There was moderate regurgitation. - Aorta: Ascending aorta diameter: 38 mm (ED). - Ascending aorta: The ascending aorta was mildly dilated. - Mitral valve: There was trivial regurgitation. - Atrial septum: There was increased thickness of the septum,   consistent with  lipomatous hypertrophy. - Pulmonary arteries: PA peak pressure: 33 mm Hg (S).   PHYSICAL EXAM Vitals:   10/27/17 1400 10/27/17 1500 10/27/17 1600 10/27/17 1631  BP: 126/68 (!) 112/57    Pulse: 73 65 91   Resp: 19 (!) 21 (!) 27   Temp:    99 F (37.2 C)  TempSrc:    Oral  SpO2: 99% 100% 95%   Weight:      Height:        Temp:  [98.1 F (36.7 C)-99 F (37.2 C)] 99 F (37.2 C) (01/26 1631) Pulse Rate:  [65-92] 91 (01/26 1600) Resp:  [12-27] 27 (01/26 1600) BP: (106-129)/(55-77) 112/57 (01/26 1500) SpO2:  [85 %-100 %] 95 % (01/26 1600) Weight:  [122 lb 14.4 oz (55.7 kg)] 122 lb 14.4 oz (55.7 kg) (01/25 1659)  General - Well nourished, well developed, in no apparent distress.  Ophthalmologic - Sharp disc margins OU.  Cardiovascular - Regular rate and rhythm.  Abdomen - soft, non-tender, colostomy present  Mental Status -  Level of arousal and orientation to time, place, and person were intact. Language including expression, naming, repetition, comprehension was assessed and found intact. Attention span and concentration were normal. Fund of Knowledge was assessed and was intact  Cranial Nerves II - XII - II - Visual field intact OU. III, IV, VI - Extraocular movements intact. V - Facial sensation intact bilaterally. VII - Facial movement intact bilaterally. VIII - Hearing & vestibular intact bilaterally. X - Palate elevates symmetrically. XI - Chin turning & shoulder shrug intact bilaterally. XII - Tongue protrusion intact.  Motor Strength - The patient's strength was normal in all extremities and pronator drift was absent.  Bulk was normal and fasciculations were absent.   Motor Tone - Muscle tone was assessed at the neck and appendages and was normal.  Reflexes - The patient's reflexes were 1+ in all extremities and she had no pathological reflexes.  Sensory - Light touch, temperature/pinprick were assessed and were symmetrical.    Coordination - The patient  had normal movements in the hands and feet with no ataxia or dysmetria.  Tremor was absent.  Gait and Station - deferred   ASSESSMENT/PLAN Desiree Adams is a 72 y.o. 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 IV TPA Friday 10/26/17 at 1715  Stroke: Right PCA infarct due to right distal P2 occlusion - presumed embolic - etiology unknown.  Resultant left hemianopia much improved  CT head  - No acute finding or explanation for symptoms.   MRI head - right PCA infarct  CTA head and neck showed right distal P2 cut off  2D Echo - EF 60-65%  Will consider TEE and loop recorder to rule out cardiac source  LDL - 54  HgbA1c - 5.2  VTE prophylaxis - SCDs Diet clear liquid Room service appropriate? 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 Yearly follow-up recommended.  Salivary gland changes suspicious for Sjogren's syndrome - SSA / SSB pending  History of ovarian cancer status post surgery and radiation  History of carcinoid lung cancer status post surgery   Hospital day # 1  This patient is critically ill due to right PCA stroke status post TPA and at significant  risk of neurological worsening, death form recurrent stroke, hemorrhagic conversion, cerebral edema, seizure. This patient's care requires constant monitoring of vital signs, hemodynamics, respiratory and cardiac monitoring, review of multiple databases, neurological assessment, discussion with family, other specialists and medical decision making of high complexity. I spent 35 minutes of neurocritical care time in the care of this patient.  Rosalin Hawking, MD PhD Stroke Neurology 10/27/2017 4:53 PM   To contact Stroke Continuity provider, please refer to [email or web address] After hours, contact General Neurology

## 2017-10-27 NOTE — Progress Notes (Signed)
PT Cancellation Note  Patient Details Name: Desiree Adams MRN: [id number] DOB: July 29, 1946   Cancelled Treatment:    Reason Eval/Treat Not Completed: Patient not medically ready. Pt with strict bed rest orders in place. PT will continue to follow pt as available and await updated activity orders.   [place] 10/27/2017, 9:45 AM

## 2017-10-27 NOTE — Progress Notes (Signed)
  Echocardiogram 2D Echocardiogram has been performed.  Kenya Shiraishi T Haruna Rohlfs 10/27/2017, 12:00 PM

## 2017-10-27 NOTE — Plan of Care (Signed)
Progressing

## 2017-10-27 NOTE — Progress Notes (Addendum)
Pt. Still has complaint of partial vision disturbance, she states " Im still having problems with my eyes but its not as bad as it was" prior to TPA.

## 2017-10-27 NOTE — Progress Notes (Signed)
  Echocardiogram 2D Echocardiogram has been performed.  Mechel Schutter T Kelley Knoth 10/27/2017, 11:59 AM

## 2017-10-28 DIAGNOSIS — Z859 Personal history of malignant neoplasm, unspecified: Secondary | ICD-10-CM

## 2017-10-28 LAB — BASIC METABOLIC PANEL
Anion gap: 8 (ref 5–15)
BUN: 16 mg/dL (ref 6–20)
CALCIUM: 8 mg/dL — AB (ref 8.9–10.3)
CO2: 15 mmol/L — ABNORMAL LOW (ref 22–32)
CREATININE: 1.75 mg/dL — AB (ref 0.44–1.00)
Chloride: 115 mmol/L — ABNORMAL HIGH (ref 101–111)
GFR calc non Af Amer: 28 mL/min — ABNORMAL LOW (ref >60)
GFR, EST AFRICAN AMERICAN: 33 mL/min — AB (ref >60)
Glucose, Bld: 95 mg/dL (ref 65–99)
Potassium: 3 mmol/L — ABNORMAL LOW (ref 3.5–5.1)
SODIUM: 138 mmol/L (ref 135–145)

## 2017-10-28 LAB — CBC
HCT: 32.7 % — ABNORMAL LOW (ref 36.0–46.0)
Hemoglobin: 10.9 g/dL — ABNORMAL LOW (ref 12.0–15.0)
MCH: 30.8 pg (ref 26.0–34.0)
MCHC: 33.3 g/dL (ref 30.0–36.0)
MCV: 92.4 fL (ref 78.0–100.0)
PLATELETS: 263 10*3/uL (ref 150–400)
RBC: 3.54 MIL/uL — ABNORMAL LOW (ref 3.87–5.11)
RDW: 15.4 % (ref 11.5–15.5)
WBC: 6.8 10*3/uL (ref 4.0–10.5)

## 2017-10-28 MED ORDER — POTASSIUM CHLORIDE CRYS ER 10 MEQ PO TBCR: 10.0000 meq | EXTENDED_RELEASE_TABLET | Freq: Two times a day (BID) | ORAL | Status: DC

## 2017-10-28 MED ORDER — POTASSIUM CHLORIDE CRYS ER 10 MEQ PO TBCR
10.0000 meq | EXTENDED_RELEASE_TABLET | Freq: Two times a day (BID) | ORAL | 0 refills | Status: DC
Start: 2017-10-29 — End: 2018-01-01

## 2017-10-28 MED ORDER — LORATADINE 10 MG PO TABS
ORAL_TABLET | ORAL | Status: AC
  Filled 2017-10-28: qty 1

## 2017-10-28 MED ORDER — ASPIRIN 325 MG PO TBEC: 325.0000 mg | DELAYED_RELEASE_TABLET | Freq: Every day | ORAL | 0 refills | Status: DC

## 2017-10-28 MED ORDER — EZETIMIBE 10 MG PO TABS: 10.0000 mg | ORAL_TABLET | Freq: Every day | ORAL | 6 refills | Status: DC

## 2017-10-28 MED ORDER — POTASSIUM CHLORIDE CRYS ER 20 MEQ PO TBCR: 40.0000 meq | EXTENDED_RELEASE_TABLET | Freq: Once | ORAL | Status: DC

## 2017-10-28 MED ORDER — LORATADINE 10 MG PO TABS
10.0000 mg | ORAL_TABLET | Freq: Once | ORAL | Status: AC
  Administered 2017-10-28: 10 mg via ORAL

## 2017-10-28 NOTE — Discharge Summary (Signed)
Stroke Discharge Summary  Patient ID: Desiree Adams   MRN: [id number]      DOB: 10/06/1945  Date of Admission: 10/26/2017 Date of Discharge: 10/28/2017  Attending Physician:  Rosalin Hawking, MD, Stroke MD Consultant(s):   Treatment Team:  Stroke, Md, MD None  Patient's PCP:  Rehabilitation, [facility] &  DISCHARGE DIAGNOSIS: Patchy acute cortical infarction in the right occipital lobe. Small acute right posterior thalamus infarct. Active Problems:   Chronic vomiting    CKD III   History of ovarian cancer    History of carcinoid lung cancer   hypokalemia   Past Medical History:  Diagnosis Date  . Anxiety   . Asthma    pt denies  . Biliary colic   . Carcinoid tumor, bronchus, lung, malignant ([place])   . Difficult intubation   . History of Clostridium difficile infection   . Other and unspecified noninfectious gastroenteritis and colitis(558.9) 01/2001   nonspecific colitis most likely from radiation  . Ovarian cancer ([place])   . PONV (postoperative nausea and vomiting)    always nausea and  vomiting  . Renal disorder   . Right lower lobe lung mass   . Small bowel obstruction ([place])   . Urinary tract infection   . Vitamin B12 deficiency    Past Surgical History:  Procedure Laterality Date  . APPENDECTOMY    . BREAST ENHANCEMENT SURGERY    . COLONOSCOPY  12/25/2011   Procedure: COLONOSCOPY;  Surgeon: Lafayette Dragon, MD;  Location: [facility] ENDOSCOPY;  Service: Endoscopy;  Laterality: N/A;  . EXPLORATORY LAPAROTOMY W/ BOWEL RESECTION    . HEMORRHOID SURGERY N/A 06/04/2013   Procedure: single coulum HEMORRHOIDECTOMY;  Surgeon: Odis Hollingshead, MD;  Location: [facility] ORS;  Service: General;  Laterality: N/A;  . LAPAROSCOPIC CHOLECYSTECTOMY    . LUNG REMOVAL, PARTIAL    . TOTAL ABDOMINAL HYSTERECTOMY W/ BILATERAL SALPINGOOPHORECTOMY    . VIDEO ASSISTED THORACOSCOPY  04/25/12   right    Allergies as of 10/28/2017      Reactions   Other Hives   Reaction to  hospital sheets ( pt is allergic to TIDE and WISK)   Sulfamethoxazole    Possibly caused severe headaches approx 72 years old (also stopped caffeine at the same time)      Medication List    TAKE these medications   ARTIFICIAL TEARS 1.4 % ophthalmic solution Generic drug:  polyvinyl alcohol Place 1 drop into both eyes at bedtime.   aspirin 325 MG EC tablet Take 1 tablet (325 mg total) by mouth daily. Start taking on:  10/29/2017   cyanocobalamin 1000 MCG/ML injection Commonly known as:  (VITAMIN B-12) Inject 1 ml SQ in deltoid once monthly. Pharmacy-please include 3 ml syringes and 1" 25 gauge needles.   ezetimibe 10 MG tablet Commonly known as:  ZETIA Take 1 tablet (10 mg total) by mouth daily. Start taking on:  10/29/2017   metoCLOPramide 10 MG tablet Commonly known as:  REGLAN Take 1 tablet (10 mg total) by mouth every 8 (eight) hours as needed for nausea or vomiting (nausea/headache).   MULTI-VITAMIN PO Take 5 mLs by mouth See admin instructions. Mix 5 mls multivitamin vegetable powder in water and drink   omeprazole 20 MG capsule Commonly known as:  PRILOSEC Take 20 mg by mouth at bedtime.   ondansetron 4 MG tablet Commonly known as:  ZOFRAN Take 1 tablet (4 mg total) by mouth every 8 (eight) hours as  needed for nausea or vomiting.   potassium chloride 10 MEQ tablet Commonly known as:  K-DUR,KLOR-CON Take 1 tablet (10 mEq total) by mouth 2 (two) times daily. Start taking on:  10/29/2017       LABORATORY STUDIES CBC    Component Value Date/Time   WBC 6.8 10/28/2017 0338   RBC 3.54 (L) 10/28/2017 0338   HGB 10.9 (L) 10/28/2017 0338   HCT 32.7 (L) 10/28/2017 0338   PLT 263 10/28/2017 0338   MCV 92.4 10/28/2017 0338   MCH 30.8 10/28/2017 0338   MCHC 33.3 10/28/2017 0338   RDW 15.4 10/28/2017 0338   LYMPHSABS 0.8 10/26/2017 1431   MONOABS 0.6 10/26/2017 1431   EOSABS 0.0 10/26/2017 1431   BASOSABS 0.0 10/26/2017 1431   CMP    Component Value Date/Time    NA 138 10/28/2017 0338   K 3.0 (L) 10/28/2017 0338   CL 115 (H) 10/28/2017 0338   CO2 15 (L) 10/28/2017 0338   GLUCOSE 95 10/28/2017 0338   BUN 16 10/28/2017 0338   CREATININE 1.75 (H) 10/28/2017 0338   CREATININE 1.42 (H) 03/22/2012 1545   CALCIUM 8.0 (L) 10/28/2017 0338   PROT 8.5 (H) 10/26/2017 1431   ALBUMIN 4.3 10/26/2017 1431   AST 27 10/26/2017 1431   ALT 21 10/26/2017 1431   ALKPHOS 84 10/26/2017 1431   BILITOT 0.5 10/26/2017 1431   GFRNONAA 28 (L) 10/28/2017 0338   GFRAA 33 (L) 10/28/2017 0338   COAGS Lab Results  Component Value Date   INR 0.87 10/26/2017   INR 0.96 05/29/2013   INR 0.84 06/24/2012   Lipid Panel    Component Value Date/Time   CHOL 142 10/27/2017 0440   TRIG 234 (H) 10/27/2017 0440   HDL 41 10/27/2017 0440   CHOLHDL 3.5 10/27/2017 0440   VLDL 47 (H) 10/27/2017 0440   LDLCALC 54 10/27/2017 0440   HgbA1C  Lab Results  Component Value Date   HGBA1C 5.2 10/27/2017   Urinalysis    Component Value Date/Time   COLORURINE YELLOW 10/26/2017 1715   APPEARANCEUR CLOUDY (A) 10/26/2017 1715   LABSPEC 1.015 10/26/2017 1715   PHURINE 6.0 10/26/2017 1715   GLUCOSEU NEGATIVE 10/26/2017 1715   HGBUR SMALL (A) 10/26/2017 1715   BILIRUBINUR NEGATIVE 10/26/2017 1715   KETONESUR NEGATIVE 10/26/2017 1715   PROTEINUR 30 (A) 10/26/2017 1715   UROBILINOGEN 0.2 04/24/2012 1345   NITRITE POSITIVE (A) 10/26/2017 1715   LEUKOCYTESUR NEGATIVE 10/26/2017 1715   Urine Drug Screen     Component Value Date/Time   LABOPIA NONE DETECTED 10/26/2017 1720   COCAINSCRNUR NONE DETECTED 10/26/2017 1720   LABBENZ NONE DETECTED 10/26/2017 1720   AMPHETMU NONE DETECTED 10/26/2017 1720   THCU NONE DETECTED 10/26/2017 1720   LABBARB NONE DETECTED 10/26/2017 1720    Alcohol Level    Component Value Date/Time   ETH <10 10/26/2017 1431     SIGNIFICANT DIAGNOSTIC STUDIES   Ct Angio Head W Or Wo Contrast Ct Angio Neck W And/or Wo Contrast 10/26/2017 IMPRESSION:   1. Right distal P2 occlusion with downstream reconstitution. No visible infarct.  2. No noted atheromatous changes in the head and neck.  3. Subtle beading of bilateral V3 segments, possible mild fibromuscular dysplasia. No superimposed dissection seen to specifically explain the presumed PCA embolus.  4. Salivary gland changes suspicious for Sjogren's syndrome.   Ct Angio Chest/abd/pel For Dissection W And/or Wo Contrast 10/26/2017 IMPRESSION:  1. No evidence of acute aortic syndrome.  2. 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 There is also ileocolic anastomosis. Dilated small bowel in the lower abdomen with fecalized contents/slow transit, findings of partial obstruction. This is of indeterminate chronicity given this patient presented with stroke symptoms.  3. Ascending aortic aneurysm, 4.2 cm in diameter. Recommend annual imaging followup by CTA or MRA. This recommendation follows 2010 ACCF/AHA/AATS/ACR/ASA/SCA/SCAI/SIR/STS/SVM Guidelines for the Diagnosis and Management of Patients with Thoracic Aortic Disease. Circulation. 2010; 121: I951-O841 4. Bilateral renal scarring.  5. Extracapsular breast implant rupture on the right.   Ct Head Code Stroke Wo Contrast 10/26/2017 IMPRESSION:  No acute finding or explanation for symptoms.   MR Brain Wo Contrast Patchy acute cortical infarction in the right occipital lobe. Small acute right posterior thalamus infarct.  Transthoracic Echocardiogram - Left ventricle: The cavity size was normal. Systolic function was normal. The estimated ejection fraction was in the range of 60% to 65%. Wall motion was normal; there were no regional wall motion abnormalities. - Aortic valve: Trileaflet; normal thickness, mildly calcified leaflets. There was moderate regurgitation. - Aorta: Ascending aorta diameter: 38 mm (ED). - Ascending aorta: The ascending aorta was mildly dilated. - Mitral valve: There was trivial  regurgitation. - Atrial septum: There was increased thickness of the septum, consistent with lipomatous hypertrophy. - Pulmonary arteries: PA peak pressure: 33 mm Hg (S).      HISTORY OF PRESENT ILLNESS  Desiree Adams is a 72 y.o. female presenting with a right PCA infarct in the setting of partial small bowel obstruction.  She had been vomiting more than typical on the day of admission, she has a history of chronic abdominal issue secondary to previous radiation she received at age 5 for ovarian cancer. Then, around 1 PM she developed acute left-sided hemi-anopia. She sought care in the emergency department tele neurology saw her and diagnosed acute stroke and treated her with IV TPA.  CT demonstrated a distal P2 occlusion, not felt to be an interventional radiology candidate. She was transported via EMS to the emergency department at [facility] where her symptoms had markedly improved following IV TPA.  She stated that she had vomited a lot earlier, and felt better from that standpoint.   LKW: 1 PM tpa given?: Yes Modified Rankin Scale: 1-No significant post stroke disability and can perform usual duties with stroke symptoms    HOSPITAL COURSE Desiree Adams is a 72 y.o. 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  Resultant left hemianopia much improved  CT head  - No acute finding or explanation for symptoms.   MRI head - right PCA infarct  CTA head and neck showed right distal P2 cut off  2D Echo - EF 60-65%  Pt refused TEE but OK with plan for loop recorder as an outpatient to rule out afib - refer to cardiology EP as outpt  LDL - 54  HgbA1c - 5.2  VTE  prophylaxis - SCDs  Diet clear liquid Room service appropriate? Yes; Fluid consistency: Thin  No antithrombotic prior to admission, now on aspirin 325  Patient will be counseled to be compliant with her antithrombotic medications  Ongoing aggressive stroke risk factor management  Therapy recommendations:  - no follow-up therapies recommended  Disposition:  discharge to home  Chronic episodic vomiting  Due to chronic partial SBO 2/2 remote pelvic radiation for ovarian cancer  Symptoms improved  On clear liquid diet  CKD, stage III  Elevated creatinine 2.16->1.75  Stable at baseline  Other Stroke Risk Factors  Advanced age  Former cigarette smoker - quit  High TG - put on zetia  Other Active Problems  Leukocytosis - WBCs - 13.2 - temp 99 -> WBCs 6.8 on repeat. Afebrile.  Ascending aortic aneurysm, 4.2 cm in diameter. Yearly follow-up recommended.  Salivary gland changes suspicious for Sjogren's syndrome - SSA / SSB pending  History of ovarian cancer status post surgery and radiation  History of carcinoid lung cancer status post surgery  Hypokalemia - 40 mEq KCl given prior to discharge - then 10 mEq twice daily for 3 days - recheck next office visit.    DISCHARGE EXAM Blood pressure (!) 116/57, pulse 75, temperature 98.2 F (36.8 C), resp. rate 18, height 5\' 4"  (1.626 m), weight 122 lb 3.2 oz (55.4 kg), SpO2 97 %. General - Well nourished, well developed, in no apparent distress.  Ophthalmologic - Sharp disc margins OU.  Cardiovascular - Regular rate and rhythm.  Abdomen - soft, non-tender, colostomy present  Mental Status -  Level of arousal and orientation to time, place, and person were intact. Language including expression, naming, repetition, comprehension was assessed and found intact. Attention span and concentration were normal. Fund of Knowledge was assessed and was intact  Cranial Nerves II - XII - II - Visual field intact  OU. III, IV, VI - Extraocular movements intact. V - Facial sensation intact bilaterally. VII - Facial movement intact bilaterally. VIII - Hearing & vestibular intact bilaterally. X - Palate elevates symmetrically. XI - Chin turning & shoulder shrug intact bilaterally. XII - Tongue protrusion intact.  Motor Strength - The patient's strength was normal in all extremities and pronator drift was absent.  Bulk was normal and fasciculations were absent.   Motor Tone - Muscle tone was assessed at the neck and appendages and was normal.  Reflexes - The patient's reflexes were 1+ in all extremities and she had no pathological reflexes.  Sensory - Light touch, temperature/pinprick were assessed and were symmetrical.    Coordination - The patient had normal movements in the hands and feet with no ataxia or dysmetria.  Tremor was absent.  Gait and Station - deferred    Discharge Diet   Bleeding precautions Fall precautions Diet Heart Room service appropriate? Yes; Fluid consistency: Thin liquids  DISCHARGE PLAN  Disposition:  Discharge to home  aspirin 325 mg daily for secondary stroke prevention.  Ongoing risk factor control by Primary Care Physician at time of discharge  Follow-up Rehabilitation, [facility] & in 1 week to check K+ level  Follow-up with Dr. Rosalin Hawking, Stroke Clinic in 6 weeks, office to schedule an appointment.  Follow-up Dr. Rayann Heman in cardiology for loop recorder - they will call you with appointment.  40 minutes were spent preparing discharge.  Mikey Bussing PA-C [facility] Pager [phone] 10/28/2017, 1:11 PM  ATTENDING NOTE: I reviewed above note and agree with the assessment and plan. I have made any additions or clarifications directly to the above note. Pt was seen and examined.   Pt refused TEE as she tired with all procedures but OK with loop recorder to rule out afib. Will refer to EP as outpt  with Dr. Rayann Heman. Her Cre much improved but still  has hypokalemia. Will give supplement. She needs to recheck it with her PCP after discharge. She will follow up with me in clinic .   Rosalin Hawking, MD PhD Stroke Neurology 10/28/2017 3:56 PM

## 2017-10-28 NOTE — Progress Notes (Signed)
Patient is complaining of a burning and itching sensation to the skin on her back.  She states that this has happened multiple times when hospitalized and ultimately develops into hives.  She believes that she is allergic to the laundry detergent used by the facility and is requesting something to relieve the symptoms.  The skin on her back is free from any rash or discoloration at the present time.  Will page practitioner on call.

## 2017-10-28 NOTE — Progress Notes (Signed)
Occupational Therapy Evaluation and Discharge Patient Details Name: Desiree Adams MRN: [id number] DOB: April 05, 1946 Today's Date: 10/28/2017    History of Present Illness Desiree Adams is a 72 y.o. female presenting with a right PCA infarct in the setting of partial small bowel obstruction. Patient left hemianopia much improved, still complains of far left peripheral vision loss. Pt has a past medical history including Anxiety, Asthma, Biliary colic, Carcinoid tumor, bronchus, lung, malignant; Ovarian cancer, Right lower lobe lung mass (and partial lung removal), and Small bowel obstruction.   Clinical Impression   PTA Pt independent in ADL and mobility. Works in [employer], drives, active. Pt is currently functioning at an independent level for ADL (showered independently earlier today) and functional transfers. Pt continues to present with far left peripheral vision deficits. Pt educated in compensatory strategies (which she has started to implement). Pt reports that her vision continues to improve. Pt is currently able to function with visual deficits at independent level. My biggest concern is driving as the left periphery is essential for road safety. OT instructed Pt that if she continues to have deficits at her neurology appointment that she should ask about a consultation with a neuro-opthamologist.  No other OT concerns at this point. OT to sign off. Thank you for the opportunity to serve this patient.     Follow Up Recommendations  No OT follow up    Equipment Recommendations  None recommended by OT    Recommendations for Other Services Other (comment)(if L vision deficits persist, neuro opthamologist)     Precautions / Restrictions Precautions Precautions: None Restrictions Weight Bearing Restrictions: No      Mobility Bed Mobility Overal bed mobility: Independent                Transfers Overall transfer level: Independent                    Balance Overall  balance assessment: No apparent balance deficits (not formally assessed)                                         ADL either performed or assessed with clinical judgement   ADL Overall ADL's : Independent                                             Vision Baseline Vision/History: Wears glasses Wears Glasses: At all times Patient Visual Report: Peripheral vision impairment Vision Assessment?: Yes Eye Alignment: Within Functional Limits Ocular Range of Motion: Within Functional Limits Alignment/Gaze Preference: Within Defined Limits Tracking/Visual Pursuits: Able to track stimulus in all quads without difficulty Saccades: Within functional limits Convergence: Within functional limits Visual Fields: Left visual field deficit(far periphery) Diplopia Assessment: (denies) Additional Comments: Pt far left peripheral vision impaired, educated on compensatory stratgies.  Pt able to read from book, play "eye spy" and find all items in room, and read far away signs/clock.      Perception     Praxis      Pertinent Vitals/Pain Pain Assessment: No/denies pain     Hand Dominance Right   Extremity/Trunk Assessment Upper Extremity Assessment Upper Extremity Assessment: Overall WFL for tasks assessed   Lower Extremity Assessment Lower Extremity Assessment: Overall WFL for tasks assessed   Cervical / Trunk  Assessment Cervical / Trunk Assessment: Normal   Communication Communication Communication: No difficulties   Cognition Arousal/Alertness: Awake/alert Behavior During Therapy: WFL for tasks assessed/performed Overall Cognitive Status: Within Functional Limits for tasks assessed                                     General Comments  Pt reports vision improving, Pt already compensating for visual disturbance. Told Pt that if she is continuing to have deficits that she should let the neurologist know and follow up with a  neuro-opthamologist    Exercises     Shoulder Instructions      Home Living Family/patient expects to be discharged to:: Private residence Living Arrangements: Alone Available Help at Discharge: Family;Available PRN/intermittently;Friend(s) Type of Home: House Home Access: Stairs to enter [employer] of Steps: 3   Home Layout: One level     Bathroom Shower/Tub: Teacher, early years/pre: Standard     Home Equipment: None          Prior Functioning/Environment Level of Independence: Independent        Comments: Drives, works in [employer]        OT Problem List:        OT Treatment/Interventions:      OT Goals(Current goals can be found in the care plan section) Acute Rehab OT Goals Patient Stated Goal: to get home OT Goal Formulation: With patient Time For Goal Achievement: 11/01/17 Potential to Achieve Goals: Good  OT Frequency:     Barriers to D/C:            Co-evaluation              AM-PAC PT "6 Clicks" Daily Activity     Outcome Measure Help from another person eating meals?: None Help from another person taking care of personal grooming?: None Help from another person toileting, which includes using toliet, bedpan, or urinal?: None Help from another person bathing (including washing, rinsing, drying)?: None Help from another person to put on and taking off regular upper body clothing?: None Help from another person to put on and taking off regular lower body clothing?: None 6 Click Score: 24   End of Session Nurse Communication: Mobility status  Activity Tolerance: Patient tolerated treatment well Patient left: in bed;with call bell/phone within reach                   Time: 4503-8882 OT Time Calculation (min): 20 min Charges:  OT General Charges $OT Visit: 1 Visit OT Evaluation $OT Eval Low Complexity: 1 Low G-Codes:    Hulda Humphrey OTR/L [phone]  Desiree Adams 10/28/2017, 3:29 PM

## 2017-10-28 NOTE — Discharge Instructions (Signed)
Hypokalemia Hypokalemia means that the amount of potassium in the blood is lower than normal.Potassium is a chemical that helps regulate the amount of fluid in the body (electrolyte). It also stimulates muscle tightening (contraction) and helps nerves work properly.Normally, most of the bodys potassium is inside of cells, and only a very small amount is in the blood. Because the amount in the blood is so small, minor changes to potassium levels in the blood can be life-threatening. What are the causes? This condition may be caused by:  Antibiotic medicine.  Diarrhea or vomiting. Taking too much of a medicine that helps you have a bowel movement (laxative) can cause diarrhea and lead to hypokalemia.  Chronic kidney disease (CKD).  Medicines that help the body get rid of excess fluid (diuretics).  Eating disorders, such as bulimia.  Low magnesium levels in the body.  Sweating a lot.  What are the signs or symptoms? Symptoms of this condition include:  Weakness.  Constipation.  Fatigue.  Muscle cramps.  Mental confusion.  Skipped heartbeats or irregular heartbeat (palpitations).  Tingling or numbness.  How is this diagnosed? This condition is diagnosed with a blood test. How is this treated? Hypokalemia can be treated by taking potassium supplements by mouth or adjusting the medicines that you take. Treatment may also include eating more foods that contain a lot of potassium. If your potassium level is very low, you may need to get potassium through an IV tube in one of your veins and be monitored in the hospital. Follow these instructions at home:  Take over-the-counter and prescription medicines only as told by your health care provider. This includes vitamins and supplements.  Eat a healthy diet. A healthy diet includes fresh fruits and vegetables, whole grains, healthy fats, and lean proteins.  If instructed, eat more foods that contain a lot of potassium, such  as: ? Nuts, such as peanuts and pistachios. ? Seeds, such as sunflower seeds and pumpkin seeds. ? Peas, lentils, and lima beans. ? Whole grain and bran cereals and breads. ? Fresh fruits and vegetables, such as apricots, avocado, bananas, cantaloupe, kiwi, oranges, tomatoes, asparagus, and potatoes. ? Orange juice. ? Tomato juice. ? Red meats. ? Yogurt.  Keep all follow-up visits as told by your health care provider. This is important. Contact a health care provider if:  You have weakness that gets worse.  You feel your heart pounding or racing.  You vomit.  You have diarrhea.  You have diabetes (diabetes mellitus) and you have trouble keeping your blood sugar (glucose) in your target range. Get help right away if:  You have chest pain.  You have shortness of breath.  You have vomiting or diarrhea that lasts for more than 2 days.  You faint. This information is not intended to replace advice given to you by your health care provider. Make sure you discuss any questions you have with your health care provider. Document Released: 09/18/2005 Document Revised: 05/06/2016 Document Reviewed: 05/06/2016 Elsevier Interactive Patient Education  2018 [employer].   Hypokalemia Hypokalemia means that the amount of potassium in the blood is lower than normal.Potassium is a chemical that helps regulate the amount of fluid in the body (electrolyte). It also stimulates muscle tightening (contraction) and helps nerves work properly.Normally, most of the bodys potassium is inside of cells, and only a very small amount is in the blood. Because the amount in the blood is so small, minor changes to potassium levels in the blood can be life-threatening.  What are the causes? This condition may be caused by:  Antibiotic medicine.  Diarrhea or vomiting. Taking too much of a medicine that helps you have a bowel movement (laxative) can cause diarrhea and lead to hypokalemia.  Chronic kidney  disease (CKD).  Medicines that help the body get rid of excess fluid (diuretics).  Eating disorders, such as bulimia.  Low magnesium levels in the body.  Sweating a lot.  What are the signs or symptoms? Symptoms of this condition include:  Weakness.  Constipation.  Fatigue.  Muscle cramps.  Mental confusion.  Skipped heartbeats or irregular heartbeat (palpitations).  Tingling or numbness.  How is this diagnosed? This condition is diagnosed with a blood test. How is this treated? Hypokalemia can be treated by taking potassium supplements by mouth or adjusting the medicines that you take. Treatment may also include eating more foods that contain a lot of potassium. If your potassium level is very low, you may need to get potassium through an IV tube in one of your veins and be monitored in the hospital. Follow these instructions at home:  Take over-the-counter and prescription medicines only as told by your health care provider. This includes vitamins and supplements.  Eat a healthy diet. A healthy diet includes fresh fruits and vegetables, whole grains, healthy fats, and lean proteins.  If instructed, eat more foods that contain a lot of potassium, such as: ? Nuts, such as peanuts and pistachios. ? Seeds, such as sunflower seeds and pumpkin seeds. ? Peas, lentils, and lima beans. ? Whole grain and bran cereals and breads. ? Fresh fruits and vegetables, such as apricots, avocado, bananas, cantaloupe, kiwi, oranges, tomatoes, asparagus, and potatoes. ? Orange juice. ? Tomato juice. ? Red meats. ? Yogurt.  Keep all follow-up visits as told by your health care provider. This is important. Contact a health care provider if:  You have weakness that gets worse.  You feel your heart pounding or racing.  You vomit.  You have diarrhea.  You have diabetes (diabetes mellitus) and you have trouble keeping your blood sugar (glucose) in your target range. Get help right  away if:  You have chest pain.  You have shortness of breath.  You have vomiting or diarrhea that lasts for more than 2 days.  You faint. This information is not intended to replace advice given to you by your health care provider. Make sure you discuss any questions you have with your health care provider. Document Released: 09/18/2005 Document Revised: 05/06/2016 Document Reviewed: 05/06/2016 Elsevier Interactive Patient Education  2018 [employer].   Acute Pain, Adult Acute pain is a type of pain that may last for just a few days or as long as six months. It is often related to an illness, injury, or medical procedure. Acute pain may be mild, moderate, or severe. It usually goes away once your injury has healed or you are no longer ill. Pain can make it hard for you to do daily activities. It can cause anxiety and lead to other problems if left untreated. Treatment depends on the cause and severity of your acute pain. Follow these instructions at home:  Check your pain level as told by your health care provider.  Take over-the-counter and prescription medicines only as told by your health care provider.  If you are taking prescription pain medicine: ? Ask your health care provider about taking a stool softener or laxative to prevent constipation. ? Do not stop taking the medicine suddenly. Talk to your  health care provider about how and when to discontinue prescription pain medicine. ? If your pain is severe, do not take more pills than instructed by your health care provider. ? Do not take other over-the-counter pain medicines in addition to this medicine unless told by your health care provider. ? Do not drive or operate heavy machinery while taking prescription pain medicine.  Apply ice or heat as told by your health care provider. These may reduce swelling and pain.  Ask your health care provider if other strategies such as distraction, relaxation, or physical therapies can  help your pain.  Keep all follow-up visits as told by your health care provider. This is important. Contact a health care provider if:  You have pain that is not controlled by medicine.  Your pain does not improve or gets worse.  You have side effects from pain medicines, such as vomitingor confusion. Get help right away if:  You have severe pain.  You have trouble breathing.  You lose consciousness.  You have chest pain or pressure that lasts for more than a few minutes. Along with the chest pain you may: ? Have pain or discomfort in one or both arms, your back, neck, jaw, or stomach. ? Have shortness of breath. ? Break out in a cold sweat. ? Feel nauseous. ? Become light-headed. These symptoms may represent a serious problem that is an emergency. Do not wait to see if the symptoms will go away. Get medical help right away. Call your local emergency services (911 in the [place] Do not drive yourself to the hospital. This information is not intended to replace advice given to you by your health care provider. Make sure you discuss any questions you have with your health care provider. Document Released: 10/03/2015 Document Revised: 02/25/2016 Document Reviewed: 10/03/2015 Elsevier Interactive Patient Education  2018 [place].   Abdominal Bloating When you have abdominal bloating, your abdomen may feel full, tight, or painful. It may also look bigger than normal or swollen (distended). Common causes of abdominal bloating include:  Swallowing air.  Constipation.  Problems digesting food.  Eating too much.  Irritable bowel syndrome. This is a condition that affects the large intestine.  Lactose intolerance. This is an inability to digest lactose, a natural sugar in dairy products.  Celiac disease. This is a condition that affects the ability to digest gluten, a protein found in some grains.  Gastroparesis. This is a condition that slows down the movement of food in  the stomach and small intestine. It is more common in people with diabetes mellitus.  Gastroesophageal reflux disease (GERD). This is a digestive condition that makes stomach acid flow back into the esophagus.  Urinary retention. This means that the body is holding onto urine, and the bladder cannot be emptied all the way.  Follow these instructions at home: Eating and drinking  Avoid eating too much.  Try not to swallow air while talking or eating.  Avoid eating while lying down.  Avoid these foods and drinks: ? Foods that cause gas, such as broccoli, cabbage, cauliflower, and baked beans. ? Carbonated drinks. ? Hard candy. ? Chewing gum. Medicines  Take over-the-counter and prescription medicines only as told by your health care provider.  Take probiotic medicines. These medicines contain live bacteria or yeasts that can help digestion.  Take coated peppermint oil capsules. Activity  Try to exercise regularly. Exercise may help to relieve bloating that is caused by gas and relieve constipation. General instructions  Keep  all follow-up visits as told by your health care provider. This is important. Contact a health care provider if:  You have nausea and vomiting.  You have diarrhea.  You have abdominal pain.  You have unusual weight loss or weight gain.  You have severe pain, and medicines do not help. Get help right away if:  You have severe chest pain.  You have trouble breathing.  You have shortness of breath.  You have trouble urinating.  You have darker urine than normal.  You have blood in your stools or have dark, tarry stools. Summary  Abdominal bloating means that the abdomen is swollen.  Common causes of abdominal bloating are swallowing air, constipation, and problems digesting food.  Avoid eating too much and avoid swallowing air.  Avoid foods that cause gas, carbonated drinks, hard candy, and chewing gum. This information is not intended  to replace advice given to you by your health care provider. Make sure you discuss any questions you have with your health care provider. Document Released: 10/20/2016 Document Revised: 10/20/2016 Document Reviewed: 10/20/2016 Elsevier Interactive Patient Education  2018 [place].   Abdominal Pain, Adult Abdominal pain can be caused by many things. Often, abdominal pain is not serious and it gets better with no treatment or by being treated at home. However, sometimes abdominal pain is serious. Your health care provider will do a medical history and a physical exam to try to determine the cause of your abdominal pain. Follow these instructions at home:  Take over-the-counter and prescription medicines only as told by your health care provider. Do not take a laxative unless told by your health care provider.  Drink enough fluid to keep your urine clear or pale yellow.  Watch your condition for any changes.  Keep all follow-up visits as told by your health care provider. This is important. Contact a health care provider if:  Your abdominal pain changes or gets worse.  You are not hungry or you lose weight without trying.  You are constipated or have diarrhea for more than 2-3 days.  You have pain when you urinate or have a bowel movement.  Your abdominal pain wakes you up at night.  Your pain gets worse with meals, after eating, or with certain foods.  You are throwing up and cannot keep anything down.  You have a fever. Get help right away if:  Your pain does not go away as soon as your health care provider told you to expect.  You cannot stop throwing up.  Your pain is only in areas of the abdomen, such as the right side or the left lower portion of the abdomen.  You have bloody or black stools, or stools that look like tar.  You have severe pain, cramping, or bloating in your abdomen.  You have signs of dehydration, such as: ? Dark urine, very little urine, or no  urine. ? Cracked lips. ? Dry mouth. ? Sunken eyes. ? Sleepiness. ? Weakness. This information is not intended to replace advice given to you by your health care provider. Make sure you discuss any questions you have with your health care provider. Document Released: 06/28/2005 Document Revised: 04/07/2016 Document Reviewed: 03/01/2016 Elsevier Interactive Patient Education  2018 [employer]. 1. Increase activity gradually as tolerated 2. Have a potassium level checked at next office visit. 3. A referral will be made to cardiology for a loop recorder to monitor heart rhythm. 4. You were found to have an aortic aneurysm during this  hospital visit. You will need to have this checked on a yearly basis. Your primary care physician could make these arrangements.

## 2017-10-28 NOTE — Evaluation (Signed)
Speech Language Pathology Evaluation Patient Details Name: Desiree Adams MRN: [id number] DOB: 09/07/1946 Today's Date: 10/28/2017 Time: 1000-1015 SLP Time Calculation (min) (ACUTE ONLY): 15 min  Problem List:  Patient Active Problem List   Diagnosis Date Noted  . 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 SBO (small bowel obstruction) ([place]) 10/24/2016  . 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 Thrombosed external hemorrhoid-right 10/28/2012  . Right lower lobe lung mass   . 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 Rectal ulcer 12/25/2011  . 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 VITAMIN B12 DEFICIENCY 11/11/2008  . BILIARY COLIC 27/78/2423  . DIARRHEA, CHRONIC 11/11/2008  . NEOPLASM, MALIGNANT, OVARY, HX OF 11/11/2008  . Personal history of other diseases of digestive system 11/11/2008   Past Medical History:  Past Medical History:  Diagnosis Date  . Anxiety   . Asthma    pt denies  . Biliary colic   . Carcinoid tumor, bronchus, lung, malignant ([place])   . Difficult intubation   . History of Clostridium difficile infection   . Other and unspecified noninfectious gastroenteritis and colitis(558.9) 01/2001   nonspecific colitis most likely from radiation  . Ovarian cancer ([place])   . PONV (postoperative nausea and vomiting)    always nausea and  vomiting  . Renal disorder   . Right lower lobe lung mass   . Small bowel obstruction ([place])   . Urinary tract infection   . Vitamin B12 deficiency    Past Surgical History:  Past Surgical History:  Procedure Laterality Date  . APPENDECTOMY    . BREAST ENHANCEMENT SURGERY    . COLONOSCOPY  12/25/2011   Procedure: COLONOSCOPY;  Surgeon: Lafayette Dragon, MD;  Location: [facility] ENDOSCOPY;  Service: Endoscopy;  Laterality: N/A;  . EXPLORATORY LAPAROTOMY W/ BOWEL RESECTION    . HEMORRHOID SURGERY N/A 06/04/2013   Procedure: single coulum HEMORRHOIDECTOMY;  Surgeon: Odis Hollingshead, MD;  Location: [facility] ORS;  Service: General;  Laterality: N/A;  .  LAPAROSCOPIC CHOLECYSTECTOMY    . LUNG REMOVAL, PARTIAL    . TOTAL ABDOMINAL HYSTERECTOMY W/ BILATERAL SALPINGOOPHORECTOMY    . 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 MRI revealed R PCA infarct.   Assessment / Plan / Recommendation Clinical Impression   Patient presents with cognitive-linguistic function appearing within normal limits. She does report some mild "memory lapses" but states she uses a calendar and written notes to help her with functional recall. She was tested today with portions of the Cognistat which revealed WNL sustained and selective attention (environmental distractions). Repetition, problem solving, reasoning and judgement appear intact. Delayed recall 4/4. Educated completed with pt re: follow-up with her PCP should she note decline in memory impacting her day to day function. No further skilled ST services recommended at this time; SLP will s/o.    SLP Assessment  SLP Recommendation/Assessment: Patient does not need any further Speech Lanaguage Pathology Services SLP Visit Diagnosis: Cognitive communication deficit (R41.841)    Follow Up Recommendations  None    Frequency and Duration   One-time visit        SLP Evaluation Cognition  Overall Cognitive Status: Within Functional Limits for tasks assessed Arousal/Alertness: Awake/alert Orientation Level: Oriented X4 Attention: Focused;Sustained;Selective Focused Attention: Appears intact Sustained Attention: Appears intact Selective Attention: Appears intact Memory:  Appears intact Awareness: Appears intact Problem Solving: Appears intact Executive Function: Reasoning;Decision Making;Organizing Reasoning: Appears  intact Organizing: Appears intact Decision Making: Appears intact Safety/Judgment: Appears intact       Comprehension  Auditory Comprehension Overall Auditory Comprehension: Appears within functional limits for tasks assessed Yes/No Questions: Within Functional Limits Commands: Within Functional Limits Conversation: Complex Visual Recognition/Discrimination Discrimination: Not tested Reading Comprehension Reading Status: Not tested    Expression Expression Primary Mode of Expression: Verbal Verbal Expression Overall Verbal Expression: Appears within functional limits for tasks assessed Written Expression Dominant Hand: Right   Oral / Motor  Oral Motor/Sensory Function Overall Oral Motor/Sensory Function: Within functional limits Motor Speech Overall Motor Speech: Appears within functional limits for tasks assessed Respiration: Within functional limits Phonation: Normal Resonance: Within functional limits Articulation: Within functional limitis Intelligibility: Intelligible Motor Planning: Witnin functional limits Motor Speech Errors: Not applicable   GO                    Aliene Altes 10/28/2017, 10:21 AM   Deneise Lever, MS, [place] Speech-Language Pathologist [phone]

## 2017-10-28 NOTE — Progress Notes (Addendum)
PT Cancellation and Discharge Note  Patient Details Name: Desiree Adams MRN: [id number] DOB: 1946-09-21   Cancelled Treatment:    Reason Eval/Treat Not Completed: PT screened, no needs identified, will sign off   Discussed pt with OT who reports she is moving and walking independently;   Roney Marion, [place] Pager [phone] Office [phone]    [place] 10/28/2017, 3:25 PM

## 2017-10-29 ENCOUNTER — Telehealth: Payer: Self-pay | Admitting: Neurology

## 2017-10-29 DIAGNOSIS — M3500 Sicca syndrome, unspecified: Secondary | ICD-10-CM

## 2017-10-29 LAB — SJOGRENS SYNDROME-B EXTRACTABLE NUCLEAR ANTIBODY: SSB (La) (ENA) Antibody, IgG: 4.2 AI — ABNORMAL HIGH (ref 0.0–0.9)

## 2017-10-29 LAB — SJOGRENS SYNDROME-A EXTRACTABLE NUCLEAR ANTIBODY

## 2017-10-29 NOTE — Telephone Encounter (Signed)
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 Also her CT head showed salivary gland changes suspicious for Sjogren's disease. I delivery result to her, and she admitted that she has dry eyes but no dry mouth. She would like [place] to refer her to a rheumatologist. I will put in order. She expressed understanding and appreciation.   Rosalin Hawking, MD PhD Stroke Neurology 10/29/2017 4:32 PM  Orders Placed This Encounter  Procedures  . Ambulatory referral to Rheumatology    Referral Priority:   Routine    Referral Type:   Consultation    Referral Reason:   Specialty Services Required    Requested Specialty:   Rheumatology    Number of Visits Requested:   1

## 2017-10-30 NOTE — Progress Notes (Signed)
Late entry to enter modified Rankin Score. Based on review of evaluation from OT evaluation by Hulda Humphrey, OT.   10/28/17 1600  Modified Rankin (Stroke Patients Only)  Pre-Morbid Rankin Score 0  Modified Rankin [place], [place]  [phone] 10/30/2017

## 2018-01-01 ENCOUNTER — Encounter: Payer: Self-pay | Admitting: Neurology

## 2018-01-01 ENCOUNTER — Ambulatory Visit: Payer: Medicare HMO | Admitting: Neurology

## 2018-01-01 VITALS — BP 112/64 | HR 76 | Ht 64.0 in | Wt 126.0 lb

## 2018-01-01 DIAGNOSIS — R918 Other nonspecific abnormal finding of lung field: Secondary | ICD-10-CM | POA: Diagnosis not present

## 2018-01-01 DIAGNOSIS — Z8543 Personal history of malignant neoplasm of ovary: Secondary | ICD-10-CM

## 2018-01-01 DIAGNOSIS — C211 Malignant neoplasm of anal canal: Secondary | ICD-10-CM

## 2018-01-01 DIAGNOSIS — I63431 Cerebral infarction due to embolism of right posterior cerebral artery: Secondary | ICD-10-CM

## 2018-01-01 DIAGNOSIS — M3500 Sicca syndrome, unspecified: Secondary | ICD-10-CM | POA: Diagnosis not present

## 2018-01-01 DIAGNOSIS — K56609 Unspecified intestinal obstruction, unspecified as to partial versus complete obstruction: Secondary | ICD-10-CM | POA: Diagnosis not present

## 2018-01-01 DIAGNOSIS — D3A090 Benign carcinoid tumor of the bronchus and lung: Secondary | ICD-10-CM

## 2018-01-01 NOTE — Progress Notes (Signed)
STROKE NEUROLOGY FOLLOW UP NOTE  NAME: Desiree Adams DOB: 09/26/1946  REASON FOR VISIT: stroke follow up HISTORY FROM: pt and chart  Today we had the pleasure of seeing Desiree Adams in follow-up at our Neurology Clinic. Pt was accompanied by no one.   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 MRI showed right PCA infarct and CTA head and neck showed right distal P2 cut off. EF 60-65%. LDL 54 and A1C 5.2. Pt refused TEE but Ok with outpt loop recorder. Pan CT no malignancy but found ascending aortic aneurysm 4.2cm. CTA neck showed salivary gland changes suspicious for Sjogren's syndrome. Checked SSA and SSB were positive. She was discharged with ASA.   Interval History During the interval time, the patient has been doing well.  Referred to rheumatology for SS but appointment will be end of this month. Has not following with cardiology for heart monitoring yet. Vision is good but still has shimmering image at far left side of the visual field. On recounting HPI, she said she had severe N/V and then she had sudden onset vision loss at the time of stroke onset. Will do TCD bubble study to rule out PFO. She denies any leg swelling or hx of DVT. She has not had PCP yet and we encourage her to find one. BP today 112/64.   REVIEW OF SYSTEMS: Full 14 system review of systems performed and notable only for those listed below and in HPI above, all others are negative:  Constitutional:  fatigue Cardiovascular:  Ear/Nose/Throat:   Skin:  Eyes: light sensitivity   Respiratory: cough  Gastroitestinal:   Genitourinary:  Hematology/Lymphatic:   Endocrine:  Musculoskeletal: muscle cramps  Allergy/Immunology:   Neurological:     Psychiatric:  Sleep: frequent waking  The following represents the patient's updated allergies and side effects list: Allergies  Allergen Reactions  . Other Hives    Reaction to hospital sheets ( pt is allergic to TIDE and [place])  . Sulfamethoxazole     Possibly caused severe headaches approx 72 years old (also stopped caffeine at the same time)  . Sulfa Antibiotics Other (See Comments)    UNKNOWN- Headaches  UNKNOWN- Headaches      The neurologically relevant items on the patient's problem list were reviewed on today's visit.  Neurologic Examination  A problem focused neurological exam (12 or more points of the single system neurologic examination, vital signs counts as 1 point, cranial nerves count for 8 points) was performed.  Blood pressure 112/64, pulse 76, height 5\' 4"  (1.626 m), weight 126 lb (57.2 kg).  General - Well nourished, well developed, in no apparent distress.  Ophthalmologic - Fundi not visualized due to small pupils.  Cardiovascular - Regular rate and rhythm.  Mental Status -  Level of arousal and orientation to time, place, and person were intact. Language including expression, naming, repetition, comprehension was assessed and found intact. Attention span and concentration were normal. Fund of Knowledge was assessed and was intact.  Cranial Nerves II - XII - II - Visual field intact OU. III, IV, VI - Extraocular movements intact. V - Facial sensation intact bilaterally. VII - Facial movement intact bilaterally. VIII - Hearing & vestibular intact bilaterally. X - Palate elevates symmetrically. XI - Chin turning & shoulder shrug intact bilaterally. XII -  Tongue protrusion intact.  Motor Strength - The patient's strength was normal in all extremities and pronator drift was absent.  Bulk was normal and fasciculations were absent.   Motor Tone - Muscle tone was assessed at the neck and appendages and was normal.  Reflexes - The patient's reflexes were  1+ in all extremities and she had no pathological reflexes.  Sensory - Light touch, temperature/pinprick, vibration and proprioception, and Romberg testing were assessed and were normal.    Coordination - The patient had normal movements in the hands and feet with no ataxia or dysmetria.  Tremor was absent.  Gait and Station - The patient's transfers, posture, gait, station, and turns were observed as normal.   Functional score  mRS = 0   0 - No symptoms.   1 - No significant disability. Able to carry out all usual activities, despite some symptoms.   2 - Slight disability. Able to look after own affairs without assistance, but unable to carry out all previous activities.   3 - Moderate disability. Requires some help, but able to walk unassisted.   4 - Moderately severe disability. Unable to attend to own bodily needs without assistance, and unable to walk unassisted.   5 - Severe disability. Requires constant nursing care and attention, bedridden, incontinent.   6 - Dead.   NIH Stroke Scale = 0   Data reviewed: I personally reviewed the images and agree with the radiology interpretations.  Ct Angio Head W Or Wo Contrast Ct Angio Neck W And/or Wo Contrast 10/26/2017 IMPRESSION:  1. Right distal P2 occlusion with downstream reconstitution. No visible infarct. 2. No noted atheromatous changes in the head and neck.  3. Subtle beading of bilateral V3 segments, possible mild fibromuscular dysplasia. No superimposed dissection seen to specifically explain the presumed PCA embolus.  4. Salivary gland changes suspicious for Sjogren's syndrome.   Ct Angio Chest/abd/pel For Dissection W And/or Wo Contrast 10/26/2017 IMPRESSION:  1. No evidence of acute aortic syndrome.  2. History of anal melanoma with proctectomy and descending colostomy. There is also ileocolic anastomosis. Dilated small bowel in the lower abdomen with fecalized contents/slow transit, findings of partial  obstruction.This is of indeterminate chronicity given this patient presented with stroke symptoms.  3. Ascending aortic aneurysm, 4.2 cm in diameter.Recommend annual imaging followup by CTA or MRA. This recommendation follows 2010ACCF/AHA/AATS/ACR/ASA/SCA/SCAI/SIR/STS/SVM Guidelines for the Diagnosis and Management of Patients with Thoracic Aortic Disease. Circulation. 2010; 121: Z124-P809 4. Bilateral renal scarring.  5. Extracapsular breast implant rupture on the right.   Ct Head Code Stroke Wo Contrast 10/26/2017 IMPRESSION:  No acute finding or explanation for symptoms.  MR Brain Wo Contrast Patchy acute cortical infarction in the right occipital lobe. Small acute right posterior thalamus infarct.  Transthoracic Echocardiogram - Left ventricle: The cavity size was normal. Systolic function was normal. The estimated ejection fraction was in the range of 60% to 65%. Wall motion was normal; there were no regional wall motion abnormalities. - Aortic valve: Trileaflet; normal thickness, mildly calcified leaflets. There was moderate regurgitation. - Aorta: Ascending aorta diameter: 38 mm (ED). - Ascending aorta: The ascending aorta was mildly dilated. - Mitral valve: There was trivial regurgitation. - Atrial septum: There was increased thickness of the septum, consistent with lipomatous hypertrophy. - Pulmonary arteries: PA peak pressure: 33 mm Hg (S).  Component     Latest Ref Rng & Units 10/27/2017 10/28/2017  Cholesterol     0 - 200 mg/dL 142   Triglycerides     <  150 mg/dL 234 (H)   HDL Cholesterol     >40 mg/dL 41   Total CHOL/HDL Ratio     RATIO 3.5   VLDL     0 - 40 mg/dL 47 (H)   LDL (calc)     0 - 99 mg/dL 54   Hemoglobin A1C     4.8 - 5.6 % 5.2   Mean Plasma Glucose     mg/dL 102.54   SSA (Ro) (ENA) Antibody, IgG     0.0 - 0.9 AI  >8.0 (H)  SSB (La) (ENA) Antibody, IgG     0.0 - 0.9 AI  4.2 (H)    Assessment: As you may recall, she is a 72 y.o.  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 MRI showed right PCA infarct and CTA head and neck showed right distal P2 cut off. EF 60-65%. LDL 54 and A1C 5.2. Pt refused TEE but Ok with outpt loop recorder. Pan CT no malignancy but found ascending aortic aneurysm 4.2cm. CTA neck showed salivary gland changes suspicious for Sjogren's syndrome. Checked SSA and SSB were positive. She was discharged with ASA. Referred to rheumatology for SS but appointment will be end of this month. Has not see cardiology for loop recorder yet. Vision is good but still has shimmering image at far left side of the visual field, back to driving. On recounting HPI, she said she had severe N/V and then she had sudden onset vision loss at the time of stroke onset. Will do TCD bubble study to rule out PFO. She denies any leg swelling or hx of DVT. She has not had PCP yet and we encourage her to find one.   Plan:  - continue ASA 325mg  daily for stroke prevention - continue zetia for high triglyceride.  - will check TCD bubble study to evaluate PFO - will refer to cardiologist for long term cardiac monitoring  - do not miss the rheumatology appointment.  - follow up with all your other specialists. - Follow up with your primary care physician for stroke risk factor modification. Recommend maintain blood pressure goal <130/80, diabetes with hemoglobin A1c goal below 7.0% and lipids with LDL cholesterol goal below 70 mg/dL.  - recommend to establish PCP care.  - follow up in 3 months with Janett Billow   Orders Placed This Encounter  Procedures  . Ambulatory referral to Cardiac Electrophysiology    Referral Priority:   Routine    Referral Type:   Consultation    Referral Reason:   Specialty Services  Required    Referred to Provider:   Thompson Grayer, MD    Requested Specialty:   Cardiology    Number of Visits Requested:   1    No orders of the defined types were placed in this encounter.   Patient Instructions  - continue ASA 325mg  daily for stroke prevention - continue zetia for high triglyceride.  - will check TCD bubble study to evaluate PFO - will refer to cardiologist for long term cardiac monitoring  - do not miss the rheumatology appointment.  - follow up with all your other specialists. - Follow up with your primary care physician for stroke risk factor modification. Recommend maintain blood pressure goal <130/80, diabetes with hemoglobin A1c goal below 7.0% and lipids with LDL cholesterol goal below 70 mg/dL.  -  recommend to establish PCP care.  - follow up in 3 months with Ace Gins, MD PhD [facility] [address], [place], [place] [address] [phone]

## 2018-01-01 NOTE — Patient Instructions (Addendum)
-   continue ASA 325mg  daily for stroke prevention - continue zetia for high triglyceride.  - will check TCD bubble study to evaluate PFO - will refer to cardiologist for long term cardiac monitoring  - do not miss the rheumatology appointment.  - follow up with all your other specialists. - Follow up with your primary care physician for stroke risk factor modification. Recommend maintain blood pressure goal <130/80, diabetes with hemoglobin A1c goal below 7.0% and lipids with LDL cholesterol goal below 70 mg/dL.  - recommend to establish PCP care.  - follow up in 3 months with Desiree Adams

## 2018-01-11 NOTE — Progress Notes (Signed)
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   History of Present Illness: Desiree Adams is a 72 y.o. female seen in consultation per request of her PCP.  According to patient her she started having increased fatigue after several surgery she had in 2014.  She also has history of dry eyes for several years.  In the last 2 years she has been experiencing dry mouth as well.  She notices pain in her bilateral upper arm.  She states in February 2019 she was hospitalized for a TIA.  At that time she had extensive lab work which came positive for [place] and La antibody.  She was diagnosed with Sjogren's and was referred here.  Also had a CT scan of her neck which showed calcification in the parotid glands suspicious of Sjogren's.  Activities of Daily Living:  Patient reports morning stiffness for 0 minute.   Patient Denies nocturnal pain.  Difficulty dressing/grooming: Denies Difficulty climbing stairs: Reports Difficulty getting out of chair: Denies Difficulty using hands for taps, buttons, cutlery, and/or writing: Denies   Review of Systems  Constitutional: Positive for fatigue. Negative for night sweats, weight gain and weight loss.  HENT: Positive for mouth dryness. Negative for mouth sores, trouble swallowing, trouble swallowing and nose dryness.   Eyes: Positive for dryness. Negative for pain, redness and visual disturbance.  Respiratory: Negative for cough, shortness of breath and difficulty breathing.   Cardiovascular: Negative for chest pain, palpitations, hypertension, irregular heartbeat and swelling in legs/feet.  Gastrointestinal: Negative for blood in stool, constipation and diarrhea.  Endocrine:  Negative for increased urination.  Genitourinary: Negative for vaginal dryness.  Musculoskeletal: Positive for myalgias and myalgias. Negative for arthralgias, joint pain, joint swelling, muscle weakness, morning stiffness and muscle tenderness.  Skin: Negative for color change, rash, hair loss, skin tightness, ulcers and sensitivity to sunlight.  Allergic/Immunologic: Negative for susceptible to infections.  Neurological: Negative for dizziness, memory loss, night sweats and weakness.  Hematological: Negative for swollen glands.  Psychiatric/Behavioral: Positive for sleep disturbance. Negative for depressed mood. The patient is nervous/anxious.     PMFS History:  Patient Active Problem List   Diagnosis Date Noted  . Sjogren's syndrome ([place]) 01/01/2018  . Stroke (cerebrum) ([place]) 10/26/2017  . SBO (small bowel obstruction) ([place]) 10/24/2016  . Melanoma of anal canal ([place]) 06/18/2013  . Thrombosed external hemorrhoid-right 10/28/2012  . Right lower lobe lung mass   . 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 Rectal ulcer 12/25/2011  . 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 VITAMIN B12 DEFICIENCY 11/11/2008  . BILIARY COLIC 51/70/0174  . DIARRHEA, CHRONIC 11/11/2008  . NEOPLASM, MALIGNANT, OVARY, HX OF 11/11/2008  . Personal history of other diseases of digestive system 11/11/2008    Past Medical History:  Diagnosis Date  . Anxiety   . Asthma    pt denies  . Biliary colic   . Carcinoid tumor, bronchus, lung, malignant ([place])   . Difficult intubation   . History of Clostridium difficile infection   . Other and unspecified noninfectious gastroenteritis and colitis(558.9) 01/2001   nonspecific colitis most  likely from radiation  . Ovarian cancer ([place])   . PONV (postoperative nausea and vomiting)    always nausea and  vomiting  . Renal disorder   . Right lower lobe lung mass   . Small bowel obstruction ([place])   . Stroke ([place])   . Urinary tract infection   . Vitamin B12 deficiency       Family History  Problem Relation Age of Onset  . Stroke Mother   . Rheum arthritis Mother   . Hypertension Mother   . Kidney cancer Unknown        ???? Grandfather  . Bladder Cancer Maternal Grandfather   . Stroke Maternal Grandmother   . Healthy Son   . Colon cancer Neg Hx    Past Surgical History:  Procedure Laterality Date  . APPENDECTOMY    . BREAST ENHANCEMENT SURGERY    . COLONOSCOPY  12/25/2011   Procedure: COLONOSCOPY;  Surgeon: Lafayette Dragon, MD;  Location: [facility] ENDOSCOPY;  Service: Endoscopy;  Laterality: N/A;  . COLOSTOMY  2013  . EXPLORATORY LAPAROTOMY W/ BOWEL RESECTION    . HEMORRHOID SURGERY N/A 06/04/2013   Procedure: single coulum HEMORRHOIDECTOMY;  Surgeon: Odis Hollingshead, MD;  Location: [facility] ORS;  Service: General;  Laterality: N/A;  . LAPAROSCOPIC CHOLECYSTECTOMY    . LUNG REMOVAL, PARTIAL    . rectum removal    . TOTAL ABDOMINAL HYSTERECTOMY W/ BILATERAL SALPINGOOPHORECTOMY    . VIDEO ASSISTED THORACOSCOPY  04/25/12   right   Social History   Social History Narrative  . Not on file     Objective: Vital Signs: BP 110/62 (BP Location: Left Arm, Patient Position: Sitting, Cuff Size: Normal)   Pulse 73   Resp 15   Ht 5\' 4"  (1.626 m)   Wt 122 lb (55.3 kg)   BMI 20.94 kg/m    Physical Exam  Constitutional: She is oriented to person, place, and time. She appears well-developed and well-nourished.  HENT:  Head: Normocephalic and atraumatic.  Left parotid fullness was noted.  Eyes: Conjunctivae and EOM are normal.  Neck: Normal range of motion.  Cardiovascular: Normal rate, regular rhythm, normal heart sounds and intact distal pulses.  Pulmonary/Chest: Effort normal and breath sounds normal.  Abdominal: Soft. Bowel sounds are normal.  Colostomy bag  Lymphadenopathy:    She has no cervical adenopathy.  Neurological: She is alert and oriented to person, place, and time.  Skin: Skin is warm and dry. Capillary refill takes less than 2 seconds.   Psychiatric: She has a normal mood and affect. Her behavior is normal.  Nursing note and vitals reviewed.    Musculoskeletal Exam: C-spine thoracic lumbar spine good range of motion.  Shoulder joints elbow joints wrist joint MCPs PIPs DIPs were in good range of motion.  Hip joints knee joints ankles MTPs PIPs been good range of motion.  She had no synovitis.  CDAI Exam: No CDAI exam completed.    Investigation: No additional findings. Component     Latest Ref Rng & Units 10/28/2017  SSA (Ro) (ENA) Antibody, IgG     0.0 - 0.9 AI >8.0 (H)  SSB (La) (ENA) Antibody, IgG     0.0 - 0.9 AI 4.2 (H)   CBC Latest Ref Rng & Units 10/28/2017 10/26/2017 02/16/2017  WBC 4.0 - 10.5 K/uL 6.8 13.2(H) 7.2  Hemoglobin 12.0 - 15.0 g/dL 10.9(L) 13.4 12.4  Hematocrit 36.0 - 46.0 % 32.7(L) 39.4 36.4  Platelets 150 - 400 K/uL 263 329 315  CMP Latest Ref Rng & Units 10/28/2017 10/26/2017 02/16/2017  Glucose 65 - 99 mg/dL 95 135(H) 106(H)  BUN 6 - 20 mg/dL 16 27(H) 25(H)  Creatinine 0.44 - 1.00 mg/dL 1.75(H) 2.16(H) 1.91(H)  Sodium 135 - 145 mmol/L 138 136 137  Potassium 3.5 - 5.1 mmol/L 3.0(L) 3.7 3.7  Chloride 101 - 111 mmol/L 115(H) 111 106  CO2 22 - 32 mmol/L 15(L) 16(L) 21(L)  Calcium 8.9 - 10.3 mg/dL 8.0(L) 9.2 9.5  Total Protein 6.5 - 8.1 g/dL - 8.5(H) -  Total Bilirubin 0.3 - 1.2 mg/dL - 0.5 -  Alkaline Phos 38 - 126 U/L - 84 -  AST 15 - 41 U/L - 27 -  ALT 14 - 54 U/L - 21 -    Imaging: No results found.  Speciality Comments: No specialty comments available.    Procedures:  No procedures performed Allergies: Other; Sulfamethoxazole; and Sulfa antibiotics   Assessment / Plan:     Visit Diagnoses: Sjogren's syndrome, with unspecified organ involvement ([place]) - Ro+ and La+"CT salivary changes and positive SSA and SSB suspicious for sjogren's" she has sicca symptoms with dry mouth and dry eyes.  Although the symptoms are not severe.  Detailed counseling regarding Sjogren's was provided.   Over-the-counter products like eyedrops and Biotene products were discussed.  We discussed possible use of DMARDs in case her symptoms get worse.  Although I would be really hesitant to put her on immunosuppressive therapy because of history of recurrent cancer.  She was in agreement.  She was to return on as needed basis.  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 Patient has colostomy bag.  History of ovarian cancer  Vitamin B12 deficiency  History of anxiety-she has anxiety and insomnia.  History of TIA (transient ischemic attack) - 02/19  Chronic renal impairment, stage 3 (moderate) ([facility]) - GFR 22 01/19.  Patient has not seen nephrologist in many years.  Have advised her to follow-up with nephrologist.  Other fatigue -she has been experiencing a lot of fatigue.  I will obtain following labs today.  Plan: CBC with Differential/Platelet, TSH, CK    Orders: Orders Placed This Encounter  Procedures  . CBC with Differential/Platelet  . TSH  . CK   No orders of the defined types were placed in this encounter.   Face-to-face time spent with patient was 50 minutes.  Greater than 50% of time was spent in counseling and coordination of care.  I will contact her with the lab results.  Follow-Up Instructions: No follow-ups on file.   Bo Merino, MD  Note - This record has been created using Editor, commissioning.  Chart creation errors have been sought, but may not always  have been located. Such creation errors do not reflect on  the standard of medical care.

## 2018-01-21 ENCOUNTER — Other Ambulatory Visit: Payer: Self-pay

## 2018-01-25 ENCOUNTER — Other Ambulatory Visit: Payer: Self-pay

## 2018-01-25 ENCOUNTER — Ambulatory Visit: Payer: Medicare HMO | Admitting: Rheumatology

## 2018-01-25 ENCOUNTER — Encounter: Payer: Self-pay | Admitting: Rheumatology

## 2018-01-25 VITALS — BP 110/62 | HR 73 | Resp 15 | Ht 64.0 in | Wt 122.0 lb

## 2018-01-25 DIAGNOSIS — R5383 Other fatigue: Secondary | ICD-10-CM

## 2018-01-25 DIAGNOSIS — Z8659 Personal history of other mental and behavioral disorders: Secondary | ICD-10-CM

## 2018-01-25 DIAGNOSIS — Z8543 Personal history of malignant neoplasm of ovary: Secondary | ICD-10-CM | POA: Diagnosis not present

## 2018-01-25 DIAGNOSIS — N183 Chronic kidney disease, stage 3 unspecified: Secondary | ICD-10-CM

## 2018-01-25 DIAGNOSIS — C211 Malignant neoplasm of anal canal: Secondary | ICD-10-CM | POA: Diagnosis not present

## 2018-01-25 DIAGNOSIS — M3500 Sicca syndrome, unspecified: Secondary | ICD-10-CM

## 2018-01-25 DIAGNOSIS — D3A090 Benign carcinoid tumor of the bronchus and lung: Secondary | ICD-10-CM

## 2018-01-25 DIAGNOSIS — Z8673 Personal history of transient ischemic attack (TIA), and cerebral infarction without residual deficits: Secondary | ICD-10-CM

## 2018-01-25 DIAGNOSIS — E538 Deficiency of other specified B group vitamins: Secondary | ICD-10-CM | POA: Diagnosis not present

## 2018-01-26 LAB — CBC WITH DIFFERENTIAL/PLATELET
BASOS ABS: 59 {cells}/uL (ref 0–200)
Basophils Relative: 1.1 %
EOS PCT: 2.2 %
Eosinophils Absolute: 119 cells/uL (ref 15–500)
HEMATOCRIT: 37.5 % (ref 35.0–45.0)
Hemoglobin: 12.5 g/dL (ref 11.7–15.5)
Lymphs Abs: 1615 cells/uL (ref 850–3900)
MCH: 30.9 pg (ref 27.0–33.0)
MCHC: 33.3 g/dL (ref 32.0–36.0)
MCV: 92.6 fL (ref 80.0–100.0)
MPV: 11 fL (ref 7.5–12.5)
Monocytes Relative: 6.9 %
NEUTROS PCT: 59.9 %
Neutro Abs: 3235 cells/uL (ref 1500–7800)
PLATELETS: 298 10*3/uL (ref 140–400)
RBC: 4.05 10*6/uL (ref 3.80–5.10)
RDW: 13.4 % (ref 11.0–15.0)
TOTAL LYMPHOCYTE: 29.9 %
WBC mixed population: 373 cells/uL (ref 200–950)
WBC: 5.4 10*3/uL (ref 3.8–10.8)

## 2018-01-26 LAB — TSH: TSH: 3.58 mIU/L (ref 0.40–4.50)

## 2018-01-28 ENCOUNTER — Other Ambulatory Visit: Payer: Self-pay

## 2018-01-28 NOTE — Patient Outreach (Signed)
Telephone outreach to patient to obtain mRS was successfully completed. mRS = 0 

## 2018-02-07 ENCOUNTER — Ambulatory Visit (HOSPITAL_COMMUNITY): Payer: Medicare HMO

## 2018-02-13 ENCOUNTER — Institutional Professional Consult (permissible substitution): Payer: Medicare HMO | Admitting: Internal Medicine

## 2018-02-17 ENCOUNTER — Other Ambulatory Visit: Payer: Self-pay

## 2018-02-17 ENCOUNTER — Emergency Department (HOSPITAL_BASED_OUTPATIENT_CLINIC_OR_DEPARTMENT_OTHER): Payer: Medicare HMO

## 2018-02-17 ENCOUNTER — Encounter (HOSPITAL_BASED_OUTPATIENT_CLINIC_OR_DEPARTMENT_OTHER): Payer: Self-pay | Admitting: Emergency Medicine

## 2018-02-17 ENCOUNTER — Emergency Department (HOSPITAL_BASED_OUTPATIENT_CLINIC_OR_DEPARTMENT_OTHER)
Admission: EM | Admit: 2018-02-17 | Discharge: 2018-02-17 | Disposition: A | Discharge status: Home / Self Care | Payer: Medicare HMO | Source: Home / Self Care | Attending: Emergency Medicine | Admitting: Emergency Medicine

## 2018-02-17 ENCOUNTER — Emergency Department (HOSPITAL_BASED_OUTPATIENT_CLINIC_OR_DEPARTMENT_OTHER)
Admission: EM | Admit: 2018-02-17 | Discharge: 2018-02-17 | Disposition: A | Discharge status: Home / Self Care | Payer: Medicare HMO | Attending: Emergency Medicine | Admitting: Emergency Medicine

## 2018-02-17 ENCOUNTER — Encounter (HOSPITAL_BASED_OUTPATIENT_CLINIC_OR_DEPARTMENT_OTHER): Payer: Self-pay | Admitting: *Deleted

## 2018-02-17 DIAGNOSIS — Z79899 Other long term (current) drug therapy: Secondary | ICD-10-CM | POA: Insufficient documentation

## 2018-02-17 DIAGNOSIS — Z7982 Long term (current) use of aspirin: Secondary | ICD-10-CM | POA: Insufficient documentation

## 2018-02-17 DIAGNOSIS — J45909 Unspecified asthma, uncomplicated: Secondary | ICD-10-CM | POA: Insufficient documentation

## 2018-02-17 DIAGNOSIS — Z8673 Personal history of transient ischemic attack (TIA), and cerebral infarction without residual deficits: Secondary | ICD-10-CM | POA: Insufficient documentation

## 2018-02-17 DIAGNOSIS — N3000 Acute cystitis without hematuria: Secondary | ICD-10-CM | POA: Insufficient documentation

## 2018-02-17 DIAGNOSIS — Z85118 Personal history of other malignant neoplasm of bronchus and lung: Secondary | ICD-10-CM | POA: Diagnosis not present

## 2018-02-17 DIAGNOSIS — N189 Chronic kidney disease, unspecified: Secondary | ICD-10-CM | POA: Insufficient documentation

## 2018-02-17 DIAGNOSIS — Z8543 Personal history of malignant neoplasm of ovary: Secondary | ICD-10-CM | POA: Insufficient documentation

## 2018-02-17 DIAGNOSIS — R1084 Generalized abdominal pain: Secondary | ICD-10-CM

## 2018-02-17 DIAGNOSIS — R112 Nausea with vomiting, unspecified: Secondary | ICD-10-CM

## 2018-02-17 DIAGNOSIS — E860 Dehydration: Secondary | ICD-10-CM

## 2018-02-17 DIAGNOSIS — R1110 Vomiting, unspecified: Secondary | ICD-10-CM

## 2018-02-17 DIAGNOSIS — Z85048 Personal history of other malignant neoplasm of rectum, rectosigmoid junction, and anus: Secondary | ICD-10-CM | POA: Insufficient documentation

## 2018-02-17 DIAGNOSIS — K599 Functional intestinal disorder, unspecified: Secondary | ICD-10-CM | POA: Diagnosis not present

## 2018-02-17 DIAGNOSIS — R109 Unspecified abdominal pain: Secondary | ICD-10-CM | POA: Diagnosis present

## 2018-02-17 LAB — CBC WITH DIFFERENTIAL/PLATELET
BASOS ABS: 0 10*3/uL (ref 0.0–0.1)
BASOS PCT: 1 %
BASOS PCT: 0 %
Basophils Absolute: 0 10*3/uL (ref 0.0–0.1)
EOS ABS: 0.3 10*3/uL (ref 0.0–0.7)
EOS ABS: 0.1 10*3/uL (ref 0.0–0.7)
EOS PCT: 4 %
EOS PCT: 1 %
HCT: 34.4 % — ABNORMAL LOW (ref 36.0–46.0)
HCT: 38.8 % (ref 36.0–46.0)
Hemoglobin: 12 g/dL (ref 12.0–15.0)
Hemoglobin: 13.4 g/dL (ref 12.0–15.0)
LYMPHS ABS: 2.7 10*3/uL (ref 0.7–4.0)
Lymphocytes Relative: 35 %
Lymphocytes Relative: 10 %
Lymphs Abs: 1 10*3/uL (ref 0.7–4.0)
MCH: 32.2 pg (ref 26.0–34.0)
MCH: 31.7 pg (ref 26.0–34.0)
MCHC: 34.9 g/dL (ref 30.0–36.0)
MCHC: 34.5 g/dL (ref 30.0–36.0)
MCV: 92.2 fL (ref 78.0–100.0)
MCV: 91.7 fL (ref 78.0–100.0)
Monocytes Absolute: 0.6 10*3/uL (ref 0.1–1.0)
Monocytes Absolute: 0.7 10*3/uL (ref 0.1–1.0)
Monocytes Relative: 8 %
Monocytes Relative: 6 %
NEUTROS PCT: 83 %
Neutro Abs: 4.1 10*3/uL (ref 1.7–7.7)
Neutro Abs: 8.5 10*3/uL — ABNORMAL HIGH (ref 1.7–7.7)
Neutrophils Relative %: 52 %
PLATELETS: 286 10*3/uL (ref 150–400)
PLATELETS: 277 10*3/uL (ref 150–400)
RBC: 3.73 MIL/uL — AB (ref 3.87–5.11)
RBC: 4.23 MIL/uL (ref 3.87–5.11)
RDW: 13.9 % (ref 11.5–15.5)
RDW: 13.8 % (ref 11.5–15.5)
WBC: 7.8 10*3/uL (ref 4.0–10.5)
WBC: 10.3 10*3/uL (ref 4.0–10.5)

## 2018-02-17 LAB — URINALYSIS, ROUTINE W REFLEX MICROSCOPIC
Bilirubin Urine: NEGATIVE
Glucose, UA: NEGATIVE mg/dL
Hgb urine dipstick: NEGATIVE
KETONES UR: NEGATIVE mg/dL
Nitrite: POSITIVE — AB
PH: 6 (ref 5.0–8.0)
Protein, ur: NEGATIVE mg/dL
SPECIFIC GRAVITY, URINE: 1.015 (ref 1.005–1.030)

## 2018-02-17 LAB — COMPREHENSIVE METABOLIC PANEL
ALT: 55 U/L — AB (ref 14–54)
ALT: 48 U/L (ref 14–54)
AST: 39 U/L (ref 15–41)
AST: 35 U/L (ref 15–41)
Albumin: 3.7 g/dL (ref 3.5–5.0)
Albumin: 3.8 g/dL (ref 3.5–5.0)
Alkaline Phosphatase: 92 U/L (ref 38–126)
Alkaline Phosphatase: 98 U/L (ref 38–126)
Anion gap: 10 (ref 5–15)
Anion gap: 10 (ref 5–15)
BUN: 38 mg/dL — ABNORMAL HIGH (ref 6–20)
BUN: 33 mg/dL — ABNORMAL HIGH (ref 6–20)
CALCIUM: 9 mg/dL (ref 8.9–10.3)
CHLORIDE: 111 mmol/L (ref 101–111)
CHLORIDE: 108 mmol/L (ref 101–111)
CO2: 16 mmol/L — AB (ref 22–32)
CO2: 19 mmol/L — AB (ref 22–32)
CREATININE: 2.46 mg/dL — AB (ref 0.44–1.00)
CREATININE: 2.33 mg/dL — AB (ref 0.44–1.00)
Calcium: 9 mg/dL (ref 8.9–10.3)
GFR calc non Af Amer: 19 mL/min — ABNORMAL LOW (ref >60)
GFR calc non Af Amer: 20 mL/min — ABNORMAL LOW (ref >60)
GFR, EST AFRICAN AMERICAN: 22 mL/min — AB (ref >60)
GFR, EST AFRICAN AMERICAN: 23 mL/min — AB (ref >60)
Glucose, Bld: 97 mg/dL (ref 65–99)
Glucose, Bld: 115 mg/dL — ABNORMAL HIGH (ref 65–99)
Potassium: 3.8 mmol/L (ref 3.5–5.1)
Potassium: 4 mmol/L (ref 3.5–5.1)
SODIUM: 137 mmol/L (ref 135–145)
SODIUM: 137 mmol/L (ref 135–145)
Total Bilirubin: 0.5 mg/dL (ref 0.3–1.2)
Total Bilirubin: 0.8 mg/dL (ref 0.3–1.2)
Total Protein: 7.3 g/dL (ref 6.5–8.1)
Total Protein: 7.4 g/dL (ref 6.5–8.1)

## 2018-02-17 LAB — URINALYSIS, MICROSCOPIC (REFLEX)

## 2018-02-17 LAB — MAGNESIUM: MAGNESIUM: 1.7 mg/dL (ref 1.7–2.4)

## 2018-02-17 LAB — LIPASE, BLOOD
LIPASE: 50 U/L (ref 11–51)
Lipase: 53 U/L — ABNORMAL HIGH (ref 11–51)

## 2018-02-17 MED ORDER — SODIUM CHLORIDE 0.9 % IV BOLUS
1000.0000 mL | Freq: Once | INTRAVENOUS | Status: AC
  Administered 2018-02-17: 1000 mL via INTRAVENOUS

## 2018-02-17 MED ORDER — ONDANSETRON HCL 4 MG/2ML IJ SOLN
4.0000 mg | Freq: Once | INTRAMUSCULAR | Status: AC
  Administered 2018-02-17: 4 mg via INTRAVENOUS
  Filled 2018-02-17: qty 2

## 2018-02-17 MED ORDER — CEPHALEXIN 500 MG PO CAPS: 500.0000 mg | ORAL_CAPSULE | Freq: Two times a day (BID) | ORAL | 0 refills | Status: DC

## 2018-02-17 MED ORDER — ONDANSETRON 4 MG PO TBDP: 4.0000 mg | ORAL_TABLET | Freq: Four times a day (QID) | ORAL | 0 refills | Status: DC | PRN

## 2018-02-17 MED ORDER — DICYCLOMINE HCL 10 MG/ML IM SOLN
20.0000 mg | Freq: Once | INTRAMUSCULAR | Status: AC
  Administered 2018-02-17: 20 mg via INTRAMUSCULAR
  Filled 2018-02-17: qty 2

## 2018-02-17 MED ORDER — DICYCLOMINE HCL 20 MG PO TABS: 20.0000 mg | ORAL_TABLET | Freq: Three times a day (TID) | ORAL | 0 refills | Status: DC | PRN

## 2018-02-17 MED ORDER — CEPHALEXIN 250 MG PO CAPS
500.0000 mg | ORAL_CAPSULE | Freq: Once | ORAL | Status: AC
  Administered 2018-02-17: 500 mg via ORAL
  Filled 2018-02-17: qty 2

## 2018-02-17 NOTE — ED Triage Notes (Signed)
Pt reports hx of bowel obstructions. Reports bloating and her colostomy is not emptying

## 2018-02-17 NOTE — ED Notes (Signed)
Given Ginger Ale to drink

## 2018-02-17 NOTE — Discharge Instructions (Addendum)
Your CT scan showed no sign of bowel obstruction today and no acute abnormality.  You had findings consistent with chronic dysmotility of your intestines from your previous surgeries, radiation.  This is likely the cause of your decreased colostomy output, nausea and abdominal discomfort.  He also have a urinary tract infection which we are discharging you on a prescription of Keflex for.  Recommend follow-up with your primary care physician in 1 week to have your urine rechecked.

## 2018-02-17 NOTE — ED Notes (Signed)
ED Provider at bedside. 

## 2018-02-17 NOTE — ED Notes (Signed)
Pt states she has a hx of bowel obstructions and she began having s/s of same earlier today. C/O bloating, cramping and nausea. Minimal stool noted in colostomy bag.

## 2018-02-17 NOTE — ED Provider Notes (Signed)
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  Has had nausea but no vomiting.  No fever.  States she thinks she has another bowel obstruction today but then reports that often she is able to go home because they improve in the ED.  ROS: See HPI Constitutional: no fever  Eyes: no drainage  ENT: no runny nose   Cardiovascular:  no chest pain  Resp: no SOB  GI: no vomiting GU: no dysuria Integumentary: no rash  Allergy: no hives  Musculoskeletal: no leg swelling  Neurological: no slurred speech ROS otherwise negative  PAST MEDICAL HISTORY/PAST SURGICAL HISTORY:  Past Medical History:  Diagnosis Date  . Anxiety   . Asthma    pt denies  . Biliary colic   . Carcinoid tumor, bronchus, lung, malignant ([place])   . Difficult intubation   . History of Clostridium difficile infection   . Other and unspecified noninfectious gastroenteritis and colitis(558.9) 01/2001   nonspecific colitis most likely from radiation  . Ovarian cancer ([place])   . PONV (postoperative nausea and vomiting)    always nausea and  vomiting  . Renal disorder   . Right lower lobe lung mass   . Small bowel obstruction ([place])   . Stroke ([place])   . Urinary tract infection   . Vitamin B12 deficiency     MEDICATIONS:  Prior to Admission medications   Medication Sig Start Date End Date Taking? Authorizing Provider  aspirin EC 325 MG EC tablet Take 1 tablet (325 mg total) by  mouth daily. 10/29/17   Rinehuls, David L, PA-C  cyanocobalamin (,VITAMIN B-12,) 1000 MCG/ML injection Inject 1 ml SQ in deltoid once monthly. Pharmacy-please include 3 ml syringes and 1" 25 gauge needles. Patient not taking: Reported on 01/01/2018 06/12/13   Lafayette Dragon, MD  ezetimibe (ZETIA) 10 MG tablet Take 1 tablet (10 mg total) by mouth daily. 10/29/17   Rinehuls, Early Chars, PA-C  metoCLOPramide (REGLAN) 10 MG tablet Take 1 tablet (10 mg total) by mouth every 8 (eight) hours as needed for nausea or vomiting (nausea/headache). Patient not taking: Reported on 01/25/2018 09/07/15   Orlie Dakin, MD  Multiple Vitamin (MULTI-VITAMIN PO) Take 5 mLs by mouth See admin instructions. Mix 5 mls multivitamin vegetable powder in water and drink    [provider]  omeprazole (PRILOSEC) 20 MG capsule Take 20 mg by mouth at bedtime.    [provider]  polyvinyl alcohol (ARTIFICIAL TEARS) 1.4 % ophthalmic solution Place 1 drop into both eyes at bedtime.    [provider]    ALLERGIES:  Allergies  Allergen Reactions  . Other Hives    Reaction to hospital sheets ( pt is allergic to TIDE and [place])  . Sulfamethoxazole     Possibly caused severe headaches approx 72 years old (also stopped caffeine at the same time)  . Sulfa Antibiotics Other (See Comments)    UNKNOWN- Headaches  UNKNOWN- Headaches      SOCIAL HISTORY:  Social History   Tobacco Use  . Smoking status: Never Smoker  .  Smokeless tobacco: Never Used  Substance Use Topics  . Alcohol use: Yes    Alcohol/week: 0.6 oz    Types: 1 Glasses of wine per week    Comment: very rarely    FAMILY HISTORY: Family History  Problem Relation Age of Onset  . Stroke Mother   . Rheum arthritis Mother   . Hypertension Mother   . Kidney cancer Unknown        ???? Grandfather  . Bladder Cancer Maternal Grandfather   . Stroke Maternal Grandmother   . Healthy Son   . Colon cancer Neg Hx     EXAM: BP 130/75 (BP  Location: Right Arm)   Pulse 75   Temp 97.9 F (36.6 C) (Oral)   Resp 18   Ht 5\' 4"  (1.626 m)   Wt 55.3 kg (122 lb)   SpO2 100%   BMI 20.94 kg/m  CONSTITUTIONAL: Alert and oriented and responds appropriately to questions. Well-appearing; well-nourished HEAD: Normocephalic EYES: Conjunctivae clear, pupils appear equal, EOMI ENT: normal nose; moist mucous membranes NECK: Supple, no meningismus, no nuchal rigidity, no LAD  CARD: RRR; S1 and S2 appreciated; no murmurs, no clicks, no rubs, no gallops RESP: Normal chest excursion without splinting or tachypnea; breath sounds clear and equal bilaterally; no wheezes, no rhonchi, no rales, no hypoxia or respiratory distress, speaking full sentences ABD/GI: Normal bowel sounds; non-distended; soft, mildly tender throughout the abdomen, no rebound, no guarding, no peritoneal signs, no hepatosplenomegaly, patient has colostomy in the left lower quadrant with no significant stool output BACK:  The back appears normal and is non-tender to palpation, there is no CVA tenderness EXT: Normal ROM in all joints; non-tender to palpation; no edema; normal capillary refill; no cyanosis, no calf tenderness or swelling    SKIN: Normal color for age and race; warm; no rash NEURO: Moves all extremities equally PSYCH: The patient's mood and manner are appropriate. Grooming and personal hygiene are appropriate.  MEDICAL DECISION MAKING: Patient here with symptoms that she feels are similar to her bowel obstructions.  Abdominal exam reveals mildly tender abdomen diffusely with decreased output from her colostomy.  She is well-appearing otherwise and in no distress.  Declines narcotic pain medication she states this will "back me up".  Agrees with IV fluids and Zofran.  Will obtain labs, urine, CT of the abdomen and pelvis.  Differential includes bowel obstruction, colitis, diverticulitis, UTI.  ED PROGRESS: Patient's labs are unremarkable other than chronic kidney  disease.  Slightly higher than her recent creatinine but she is receiving IV hydration.  She does appear to have a nitrite positive urinary tract infection.  It appears she has grown Klebsiella in the past on review of her records that has been sensitive to Keflex, Bactrim and Cipro.  Will start Keflex here.  We will send urine culture.  CT scan shows no acute abnormality.  She has dilated small bowel noted that has been present in the past but no definite evidence of a bowel obstruction.  Findings likely reflect chronic dysmotility.  Colostomy is unremarkable in appearance.  Patient is reassured by these findings.  She is still having some crampy abdominal pain.  She agrees to IM Bentyl.  Will fluid challenge patient.  Anticipate discharge home with outpatient follow-up.  Patient reports feeling much better after Bentyl and Zofran.  Drinking without difficulty.  I feel she is safe to be discharged home and she agrees.  We discussed at length return precautions and recommended increase fluid intake at  home.  Will discharge with prescriptions of Bentyl, Zofran and Keflex.  She will follow-up with her primary care provider.  At this time, I do not feel there is any life-threatening condition present. I have reviewed and discussed all results (EKG, imaging, lab, urine as appropriate) and exam findings with patient/family. I have reviewed nursing notes and appropriate previous records.  I feel the patient is safe to be discharged home without further emergent workup and can continue workup as an outpatient as needed. Discussed usual and customary return precautions. Patient/family verbalize understanding and are comfortable with this plan.  Outpatient follow-up has been provided if needed. All questions have been answered.    Ward, Delice Bison, DO 02/17/18 [phone]

## 2018-02-17 NOTE — ED Provider Notes (Signed)
[facility] EMERGENCY DEPARTMENT Provider Note   CSN: [id number] Arrival date & time: 02/17/18  1844     History   Chief Complaint Chief Complaint  Patient presents with  . Emesis    HPI Desiree Adams is a 72 y.o. female.  HPI  72 year old female presents with recurrent vomiting and muscle cramps.  She states overall her symptoms started last night around 6 PM.  She developed abdominal pain and cramping, vomiting, and concern for recurrent bowel obstruction.  She was seen here last night around 1 AM and discharged around 3 AM.  Her CT scan did not show an obvious obstruction and she was given prescriptions for UTI and nausea (has not filled them yet).  She was feeling better after fluids and Zofran and went home.  Around 2 PM she developed recurrent vomiting and states that whenever she vomits like this she starts getting muscle cramps from dehydration.  She is unable to get up due to the severe cramping.  Currently has no pain but as soon as she starts vomiting she will develop further cramping.  This is a recurrent issue for her.  The abdominal pain and bloating has resolved and not recurred.  There are no urinary symptoms.  Past Medical History:  Diagnosis Date  . Anxiety   . Asthma    pt denies  . Biliary colic   . Carcinoid tumor, bronchus, lung, malignant ([place])   . Difficult intubation   . History of Clostridium difficile infection   . Other and unspecified noninfectious gastroenteritis and colitis(558.9) 01/2001   nonspecific colitis most likely from radiation  . Ovarian cancer ([place])   . PONV (postoperative nausea and vomiting)    always nausea and  vomiting  . Renal disorder   . Right lower lobe lung mass   . Small bowel obstruction ([place])   . Stroke ([place])   . Urinary tract infection   . Vitamin B12 deficiency     Patient Active Problem List   Diagnosis Date Noted  . Sjogren's syndrome ([place]) 01/01/2018  . Stroke (cerebrum) ([place]) 10/26/2017  . SBO (small  bowel obstruction) ([place]) 10/24/2016  . 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 Thrombosed external hemorrhoid-right 10/28/2012  . Right lower lobe lung mass   . 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 Rectal ulcer 12/25/2011  . 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 VITAMIN B12 DEFICIENCY 11/11/2008  . BILIARY COLIC 30/04/6225  . DIARRHEA, CHRONIC 11/11/2008  . NEOPLASM, MALIGNANT, OVARY, HX OF 11/11/2008  . Personal history of other diseases of digestive system 11/11/2008    Past Surgical History:  Procedure Laterality Date  . APPENDECTOMY    . BREAST ENHANCEMENT SURGERY    . COLONOSCOPY  12/25/2011   Procedure: COLONOSCOPY;  Surgeon: Lafayette Dragon, MD;  Location: [facility]COPY;  Service: Endoscopy;  Laterality: N/A;  . COLOSTOMY  2013  . EXPLORATORY LAPAROTOMY W/ BOWEL RESECTION    . HEMORRHOID SURGERY N/A 06/04/2013   Procedure: single coulum HEMORRHOIDECTOMY;  Surgeon: Odis Hollingshead, MD;  Location: [facility] ORS;  Service: General;  Laterality: N/A;  . LAPAROSCOPIC CHOLECYSTECTOMY    . LUNG REMOVAL, PARTIAL    . rectum removal    . TOTAL ABDOMINAL HYSTERECTOMY W/ BILATERAL SALPINGOOPHORECTOMY    . VIDEO ASSISTED THORACOSCOPY  04/25/12   right     OB History   None      Home Medications    Prior to Admission medications   Medication Sig Start Date  End Date Taking? Authorizing Provider  aspirin EC 325 MG EC tablet Take 1 tablet (325 mg total) by mouth daily. 10/29/17   Rinehuls, Early Chars, PA-C  cephALEXin (KEFLEX) 500 MG capsule Take 1 capsule (500 mg total) by mouth 2 (two) times daily. 02/17/18   Ward, Delice Bison, DO  cyanocobalamin (,VITAMIN B-12,) 1000 MCG/ML injection Inject 1 ml SQ in deltoid once monthly. Pharmacy-please include 3 ml syringes and 1" 25 gauge needles. Patient not taking: Reported on 01/01/2018 06/12/13   Lafayette Dragon, MD  dicyclomine (BENTYL) 20 MG tablet Take 1 tablet (20 mg total) by mouth every 8 (eight) hours as needed for spasms (Abdominal cramping). 02/17/18    Ward, Delice Bison, DO  ezetimibe (ZETIA) 10 MG tablet Take 1 tablet (10 mg total) by mouth daily. 10/29/17   Rinehuls, Early Chars, PA-C  metoCLOPramide (REGLAN) 10 MG tablet Take 1 tablet (10 mg total) by mouth every 8 (eight) hours as needed for nausea or vomiting (nausea/headache). Patient not taking: Reported on 01/25/2018 09/07/15   Orlie Dakin, MD  Multiple Vitamin (MULTI-VITAMIN PO) Take 5 mLs by mouth See admin instructions. Mix 5 mls multivitamin vegetable powder in water and drink    [provider]  omeprazole (PRILOSEC) 20 MG capsule Take 20 mg by mouth at bedtime.    [provider]  ondansetron (ZOFRAN ODT) 4 MG disintegrating tablet Take 1 tablet (4 mg total) by mouth every 6 (six) hours as needed for nausea or vomiting. 02/17/18   Ward, Delice Bison, DO  polyvinyl alcohol (ARTIFICIAL TEARS) 1.4 % ophthalmic solution Place 1 drop into both eyes at bedtime.    [provider]    Family History Family History  Problem Relation Age of Onset  . Stroke Mother   . Rheum arthritis Mother   . Hypertension Mother   . Kidney cancer Unknown        ???? Grandfather  . Bladder Cancer Maternal Grandfather   . Stroke Maternal Grandmother   . Healthy Son   . Colon cancer Neg Hx     Social History Social History   Tobacco Use  . Smoking status: Never Smoker  . Smokeless tobacco: Never Used  Substance Use Topics  . Alcohol use: Yes    Alcohol/week: 0.6 oz    Types: 1 Glasses of wine per week    Comment: very rarely  . Drug use: No     Allergies   Other; Sulfamethoxazole; and Sulfa antibiotics   Review of Systems Review of Systems  Constitutional: Negative for fever.  Gastrointestinal: Positive for abdominal pain, nausea and vomiting. Negative for diarrhea.  Genitourinary: Negative for dysuria.  Musculoskeletal: Positive for myalgias.  All other systems reviewed and are negative.    Physical Exam Updated Vital Signs BP 100/63 (BP Location: Right  Arm)   Pulse 97   Temp 98.5 F (36.9 C) (Oral)   Resp 16   Ht 5\' 4"  (1.626 m)   Wt 55.3 kg (122 lb)   SpO2 97%   BMI 20.94 kg/m   Physical Exam  Constitutional: She is oriented to person, place, and time. She appears well-developed and well-nourished.  HENT:  Head: Normocephalic and atraumatic.  Right Ear: External ear normal.  Left Ear: External ear normal.  Nose: Nose normal.  Eyes: Right eye exhibits no discharge. Left eye exhibits no discharge.  Cardiovascular: Regular rhythm and normal heart sounds. Tachycardia present.  HR~100  Pulmonary/Chest: Effort normal and breath sounds normal.  Abdominal: Soft. There  is tenderness in the right lower quadrant.    Musculoskeletal:  No muscle pain/tenderness or swelling  Neurological: She is alert and oriented to person, place, and time.  Skin: Skin is warm and dry.  Nursing note and vitals reviewed.    ED Treatments / Results  Labs (all labs ordered are listed, but only abnormal results are displayed) Labs Reviewed  CBC WITH DIFFERENTIAL/PLATELET - Abnormal; Notable for the following components:      Result Value   Neutro Abs 8.5 (*)    All other components within normal limits  COMPREHENSIVE METABOLIC PANEL - Abnormal; Notable for the following components:   CO2 19 (*)    Glucose, Bld 115 (*)    BUN 33 (*)    Creatinine, Ser 2.33 (*)    GFR calc non Af Amer 20 (*)    GFR calc Af Amer 23 (*)    All other components within normal limits  LIPASE, BLOOD  MAGNESIUM    EKG None  Radiology Ct Abdomen Pelvis Wo Contrast  Result Date: 02/17/2018 CLINICAL DATA:  Acute onset of generalized abdominal pain. Personal history of multiple bowel obstructions. EXAM: CT ABDOMEN AND PELVIS WITHOUT CONTRAST TECHNIQUE: Multidetector CT imaging of the abdomen and pelvis was performed following the standard protocol without IV contrast. COMPARISON:  CT of the chest, abdomen and pelvis performed 10/26/2017 FINDINGS: Lower chest: The  visualized lung bases are grossly clear. The visualized portions of the mediastinum are unremarkable. Hepatobiliary: The liver is unremarkable in appearance. The patient is status post cholecystectomy, with clips noted at the gallbladder fossa. The common bile duct remains normal in caliber. Pancreas: The pancreas is within normal limits. Spleen: The spleen is unremarkable in appearance. Adrenals/Urinary Tract: The adrenal glands are unremarkable in appearance. Tiny nonobstructing left renal stones measure up to 2 mm in size. A small right renal cyst is seen. There is no evidence of hydronephrosis. No obstructing ureteral stones are identified. No significant perinephric stranding is seen. Stomach/Bowel: Multiple bowel suture lines are noted at the right side of the abdomen. Dilated small bowel is again noted, measuring up to 3.8 cm in diameter, with minimal edema noted adjacent to the distal ileum. There is no definite evidence of bowel obstruction. 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 Residual fluid and stool is noted within the colon. The patient's left lower quadrant colostomy is unremarkable in appearance. The patient is status post resection of the sigmoid colon and rectum. The stomach is partially filled with fluid and is unremarkable in appearance. Vascular/Lymphatic: The abdominal aorta is unremarkable in appearance. The inferior vena cava is grossly unremarkable. No retroperitoneal lymphadenopathy is seen. No pelvic sidewall lymphadenopathy is identified. Reproductive: The bladder is mildly distended and grossly unremarkable. Other: Postoperative change is noted along the anterior abdominal wall. Musculoskeletal: No acute osseous abnormalities are identified. There is stable chronic deformity of the pubic symphysis and right pubic rami. The visualized musculature is unremarkable in appearance. Chronic right-sided breast implant rupture is again noted. IMPRESSION: 1. No acute abnormality seen to  explain the patient's symptoms. 2. Dilated small bowel again noted, measuring up to 3.8 cm in diameter, with minimal edema adjacent to the distal ileum. No definite evidence of bowel obstruction. 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 3. Left lower quadrant colostomy is unremarkable in appearance. 4. Tiny nonobstructing left renal stones measure up to 2 mm in size. Small right renal cyst seen. Electronically Signed   By: Garald Balding M.D.   On: 02/17/2018 02:07  Procedures Procedures (including critical care time)  Medications Ordered in ED Medications  sodium chloride 0.9 % bolus 1,000 mL (0 mLs Intravenous Stopped 02/17/18 2059)  ondansetron (ZOFRAN) injection 4 mg (4 mg Intravenous Given 02/17/18 1957)     Initial Impression / Assessment and Plan / ED Course  I have reviewed the triage vital signs and the nursing notes.  Pertinent labs & imaging results that were available during my care of the patient were reviewed by me and considered in my medical decision making (see chart for details).     Patient presents with recurrent vomiting.  This is similar to multiple prior episodes.  CT scan report reviewed from yesterday.  Labs very similar today with slight improvement in bicarbonate and renal function.  She has chronic kidney disease.  She is feeling much better after IV fluids and the cramping is gone.  Her electrolytes are unremarkable otherwise.  She would like to go home which I think is reasonable.  We discussed need for close PCP follow-up as well as return precautions.  Final Clinical Impressions(s) / ED Diagnoses   Final diagnoses:  Vomiting in adult  Dehydration    ED Discharge Orders    None       Sherwood Gambler, MD 02/17/18 2350

## 2018-02-17 NOTE — ED Triage Notes (Signed)
Patient states that she was here yesterday and was seen for the bloating, she reports that she did not have a bowel obstruction, however she reports that today she is having N/V and cant stop throwing up. PAtient states that she is having leg cramping and the last time this happened she passed out - "I probably need an IV so the leg cramping will stop since I am so dehydrated"

## 2018-02-17 NOTE — ED Notes (Signed)
Pt given d/c instructions as per chart. Verbalizes understanding. No questions. Rx x 3

## 2018-02-19 LAB — URINE CULTURE: Culture: >=100000 — AB

## 2018-02-20 ENCOUNTER — Telehealth: Payer: Self-pay | Admitting: Emergency Medicine

## 2018-02-20 NOTE — Telephone Encounter (Signed)
Post ED Visit - Positive Culture Follow-up  Culture report reviewed by antimicrobial stewardship pharmacist:  []  Elenor Quinones, Pharm.D. []  Heide Guile, Pharm.D., BCPS AQ-ID []  Parks Neptune, Pharm.D., BCPS []  Alycia Rossetti, Pharm.D., BCPS []  [place], [place].D., BCPS, AAHIVP []  Legrand Como, Pharm.D., BCPS, AAHIVP []  Salome Arnt, PharmD, BCPS []  Wynell Balloon, PharmD []  Vincenza Hews, PharmD, BCPS Jimmy Footman PharmD  Positive urine culture Treated with cephalexin, organism sensitive to the same and no further patient follow-up is required at this time.  Hazle Nordmann 02/20/2018, 12:35 PM

## 2018-04-08 ENCOUNTER — Ambulatory Visit: Payer: Medicare HMO | Admitting: Adult Health

## 2018-04-08 ENCOUNTER — Ambulatory Visit: Payer: Medicare HMO | Admitting: Physician Assistant

## 2018-04-19 ENCOUNTER — Encounter (INDEPENDENT_AMBULATORY_CARE_PROVIDER_SITE_OTHER): Payer: Self-pay

## 2018-04-19 ENCOUNTER — Ambulatory Visit: Payer: Medicare HMO | Admitting: Physician Assistant

## 2018-04-19 ENCOUNTER — Other Ambulatory Visit (INDEPENDENT_AMBULATORY_CARE_PROVIDER_SITE_OTHER): Payer: Medicare HMO

## 2018-04-19 ENCOUNTER — Encounter: Payer: Self-pay | Admitting: Physician Assistant

## 2018-04-19 VITALS — BP 102/58 | HR 62 | Ht 64.0 in | Wt 120.0 lb

## 2018-04-19 DIAGNOSIS — Z1211 Encounter for screening for malignant neoplasm of colon: Secondary | ICD-10-CM

## 2018-04-19 DIAGNOSIS — Z8543 Personal history of malignant neoplasm of ovary: Secondary | ICD-10-CM

## 2018-04-19 DIAGNOSIS — K56600 Partial intestinal obstruction, unspecified as to cause: Secondary | ICD-10-CM

## 2018-04-19 DIAGNOSIS — Z85048 Personal history of other malignant neoplasm of rectum, rectosigmoid junction, and anus: Secondary | ICD-10-CM

## 2018-04-19 DIAGNOSIS — C211 Malignant neoplasm of anal canal: Secondary | ICD-10-CM

## 2018-04-19 LAB — BASIC METABOLIC PANEL
BUN: 32 mg/dL — AB (ref 6–23)
CHLORIDE: 113 meq/L — AB (ref 96–112)
CO2: 19 mEq/L (ref 19–32)
Calcium: 9 mg/dL (ref 8.4–10.5)
Creatinine, Ser: 2.45 mg/dL — ABNORMAL HIGH (ref 0.40–1.20)
GFR: 20.64 mL/min — AB (ref >60.00)
GLUCOSE: 93 mg/dL (ref 70–99)
Potassium: 3.6 mEq/L (ref 3.5–5.1)
SODIUM: 140 meq/L (ref 135–145)

## 2018-04-19 NOTE — Patient Instructions (Signed)
Your provider has requested that you go to the basement level for lab work before leaving today. Press "B" on the elevator. The lab is located at the first door on the left as you exit the elevator. You have been scheduled for a colonoscopy. Please follow written instructions given to you at your visit today.  Please pick up your prep supplies at the pharmacy within the next 1-3 days. If you use inhalers (even only as needed), please bring them with you on the day of your procedure.  You have been scheduled for a CT Enterography scan of the abdomen and pelvis at [facility] ([address]---this is in the same building as [facility]).   You are scheduled on Friday at 04-26-2018. You should arrive at 1:00 PM to your appointment time for registration. Please follow the written instructions below on the day of your exam: You will drink contrast at the CT scan office.   WARNING: IF YOU ARE ALLERGIC TO IODINE/X-RAY DYE, PLEASE NOTIFY RADIOLOGY IMMEDIATELY AT [phone]! YOU WILL BE GIVEN A 13 HOUR PREMEDICATION PREP.  1) Do not eat  anything after 10:30 am (4 hours prior to your test)  You may take any medications as prescribed with a small amount of water except for the following: Metformin, Glucophage, Glucovance, Avandamet, Riomet, Fortamet, Actoplus Met, Janumet, Glumetza or Metaglip. The above medications must be held the day of the exam AND 48 hours after the exam.  The purpose of you drinking the oral contrast is to aid in the visualization of your intestinal tract. The contrast solution may cause some diarrhea. Before your exam is started, you will be given a small amount of fluid to drink. Depending on your individual set of symptoms, you may also receive an intravenous injection of x-ray contrast/dye. Plan on being at [facility] for 30 minutes or long, depending on the type of exam you are having performed.  If you have any questions regarding your exam or if you  need to reschedule, you may call the CT department at [phone] between the hours of 8:00 am and 5:00 pm, Monday-Friday.  ________________________________________________________________________

## 2018-04-19 NOTE — Progress Notes (Signed)
Subjective:    Patient ID: Desiree Adams, female    DOB: 07-20-1946, 72 y.o.   MRN: [id number]  HPI Desiree Adams is a pleasant 72 year old white female, former patient of Dr. [name] who comes in today to reestablish care here and would like to be established with Dr. Silverio Decamp. Patient has extensive past medical history.  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  This was treated with TPA and she has been on aspirin since.  No other blood thinners. She has history of ovarian cancer diagnosed in her early 27s, she is status post total abdominal hysterectomy, BSO chemotherapy and radiation.  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  Patient has had a carcinoid of the lung, Sjogren's syndrome and was diagnosed with an anal melanoma in 2013 per Dr. Olevia Perches.  She had undergone colonoscopy in March 2013 which was an incomplete exam due to finding of multiple rectal ulcers and extensive colitis in the sigmoid colon.  Biopsies showed radiation changes in the colon and a melanocytic nevus from the anal biopsy.  Patient was subsequently referred to [facility] and underwent AP resection.  She has been followed by Dr. Mady Gemma oncology at [facility] since.  She had a T4 BN 0 anal melanoma. She has had serial PET scans, the last was done in June 2019 which was negative.  There were changes consistent with sequelae of prior APR and previous small bowel resections there was some increased FDG uptake in 1 of her anastomoses indeterminant. Patient relates that she has had 4-5 episodes per year over the past several years of intermittent small bowel obstructions.  She last had an episode in May 2019 which took her to the emergency room.  She says these episodes seem to be getting worse.  She develops acute abdominal pain, her colostomy stops working, develops abdominal stent distention followed by nausea and vomiting.  She says the last few episodes  she is also developed cramping in her extremities which has been extremely uncomfortable.  Usually once she purges her bowel with several episodes of vomiting symptoms will improve within several hours. Surgical oncology/Dr. Randon Goldsmith at [facility] has recommended that she have further small bowel imaging to see if she has any transition point which potentially could be managed surgically and also advised her to have follow-up colonoscopy.  Review of Systems Pertinent positive and negative review of systems were noted in the above HPI section.  All other review of systems was otherwise negative.  Outpatient Encounter Medications as of 04/19/2018  Medication Sig  . aspirin EC 325 MG EC tablet Take 1 tablet (325 mg total) by mouth daily.  . busPIRone (BUSPAR) 5 MG tablet Take 5 mg by mouth daily.  . cephALEXin (KEFLEX) 500 MG capsule Take 1 capsule (500 mg total) by mouth 2 (two) times daily.  . metoCLOPramide (REGLAN) 10 MG tablet Take 1 tablet (10 mg total) by mouth every 8 (eight) hours as needed for nausea or vomiting (nausea/headache).  . Multiple Vitamin (MULTI-VITAMIN PO) Take 5 mLs by mouth See admin instructions. Mix 5 mls multivitamin vegetable powder in water and drink  . omeprazole (PRILOSEC) 20 MG capsule Take 20 mg by mouth at bedtime.  . polyvinyl alcohol (ARTIFICIAL TEARS) 1.4 % ophthalmic solution Place 1 drop into both eyes at bedtime.  . [DISCONTINUED] cyanocobalamin (,VITAMIN B-12,) 1000 MCG/ML injection Inject 1 ml SQ in deltoid once monthly. Pharmacy-please include 3 ml syringes and 1" 25 gauge  needles. (Patient not taking: Reported on 01/01/2018)  . [DISCONTINUED] dicyclomine (BENTYL) 20 MG tablet Take 1 tablet (20 mg total) by mouth every 8 (eight) hours as needed for spasms (Abdominal cramping).  . [DISCONTINUED] ezetimibe (ZETIA) 10 MG tablet Take 1 tablet (10 mg total) by mouth daily.  . [DISCONTINUED] ondansetron (ZOFRAN ODT) 4 MG disintegrating tablet Take 1 tablet (4 mg total) by  mouth every 6 (six) hours as needed for nausea or vomiting.   No facility-administered encounter medications on file as of 04/19/2018.    Allergies  Allergen Reactions  . Other Hives    Reaction to hospital sheets ( pt is allergic to TIDE and [place])  . Sulfamethoxazole     Possibly caused severe headaches approx 72 years old (also stopped caffeine at the same time)  . Sulfa Antibiotics Other (See Comments)    UNKNOWN- Headaches  UNKNOWN- Headaches     Patient Active Problem List   Diagnosis Date Noted  . Sjogren's syndrome ([place]) 01/01/2018  . Stroke (cerebrum) ([place]) 10/26/2017  . SBO (small bowel obstruction) ([place]) 10/24/2016  . Melanoma of anal canal ([place]) 06/18/2013  . Thrombosed external hemorrhoid-right 10/28/2012  . Right lower lobe lung mass   . 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 Rectal ulcer 12/25/2011  . 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 VITAMIN B12 DEFICIENCY 11/11/2008  . BILIARY COLIC 21/30/8657  . DIARRHEA, CHRONIC 11/11/2008  . NEOPLASM, MALIGNANT, OVARY, HX OF 11/11/2008  . Personal history of other diseases of digestive system 11/11/2008   Social History   Socioeconomic History  . Marital status: Divorced    Spouse name: Not on file  . Number of children: Not on file  . Years of education: Not on file  . Highest education level: Not on file  Occupational History  . Occupation: Scientist, clinical (histocompatibility and immunogenetics): [employer]  Social Needs  . Financial resource strain: Not on file  . Food insecurity:    Worry: Not on file    Inability: Not on file  . Transportation needs:    Medical: Not on file    Non-medical: Not on file  Tobacco Use  . Smoking status: Never Smoker  . Smokeless tobacco: Never Used  Substance and Sexual Activity  . Alcohol use: Yes    Alcohol/week: 0.6 oz    Types: 1 Glasses of wine per week    Comment: very rarely  . Drug use: No  . Sexual activity: Yes    Birth control/protection: Surgical  Lifestyle  . Physical activity:    Days per week: Not on  file    Minutes per session: Not on file  . Stress: Not on file  Relationships  . Social connections:    Talks on phone: Not on file    Gets together: Not on file    Attends religious service: Not on file    Active member of club or organization: Not on file    Attends meetings of clubs or organizations: Not on file    Relationship status: Not on file  . Intimate partner violence:    Fear of current or ex partner: Not on file    Emotionally abused: Not on file    Physically abused: Not on file    Forced sexual activity: Not on file  Other Topics Concern  . Not on file  Social History Narrative  . Not on file    Desiree Adams's family history includes Bladder Cancer in her maternal grandfather; Healthy in her son;  Hypertension in her mother; Kidney cancer in her unknown relative; Rheum arthritis in her mother; Stroke in her maternal grandmother and mother.      Objective:    Vitals:   04/19/18 1417  BP: (!) 102/58  Pulse: 62    Physical Exam; well-developed older white female in no acute distress, pleasant blood pressure 102/58 pulse 62, height 5 foot 4, weight 120, BMI 20.6.  HEENT; nontraumatic normocephalic EOMI PERRLA sclera anicteric, Oropharynx benign, Cardiovascular; regular rate and rhythm with S1-S2 no murmur rub or gallop, Pulmonary; clear bilaterally, Abdomen ;soft, non distended, bowel sounds are active no palpable mass or hepatosplenomegaly, midline incisional scar and functioning colostomy in the left mid quadrant.  Rectal; exam not done patient status post AP resection, Extremities ;no clubbing cyanosis or edema skin warm and dry, Neuro psych ;alert and oriented, grossly nonfocal mood and affect appropriate       Assessment & Plan:   #56 72 year old white female status post multiple abdominal surgeries with history of remote ovarian cancer status post TAH/BSO and radiation with development of radiation enteritis/colitis.  Patient has had chronic diarrhea 40+  years. #2.  History of anal melanoma diagnosed 2013 status post AP resection and colostomy- [facility]. #3 status post cholecystectomy #4 Sjogren's syndrome #5.  History of carcinoid tumor of the lung #6 history of partial small bowel obstruction status post lysis of adhesions 2001.  Patient currently has 4-5 episodes per year of transient obstruction, last episode May 2019-question secondary to adhesions versus radiation enteritis versus combination of both #7 history of CVA January 2019-on aspirin therapy  Plan; patient will be scheduled for CT enterography.  If definite transition point found, she will need to be referred back to her oncologic surgeon at [facility] Dr. Randon Goldsmith  Patient will be scheduled for colonoscopy via colostomy with Dr. Silverio Decamp.  Procedure was discussed in detail with the patient including indications risks and benefits and she is agreeable to proceed  Further plans pending results of above.  Mikle Sternberg S Kaysha Parsell PA-C 04/19/2018   Cc: No ref. provider found

## 2018-04-23 NOTE — Progress Notes (Signed)
Reviewed and agree with documentation and assessment and plan. K. Veena Laron Angelini , MD   

## 2018-04-24 ENCOUNTER — Telehealth: Payer: Self-pay | Admitting: Gastroenterology

## 2018-04-24 ENCOUNTER — Other Ambulatory Visit: Payer: Self-pay

## 2018-04-24 DIAGNOSIS — K56600 Partial intestinal obstruction, unspecified as to cause: Secondary | ICD-10-CM

## 2018-04-24 NOTE — Telephone Encounter (Signed)
The patient cannot have the planned CT due to her renal failure. She will go to the [facility] for the small bowel follow through on Friday 04/26/18 arrive at 10:00 am fasting after midnight.

## 2018-04-26 ENCOUNTER — Ambulatory Visit (HOSPITAL_COMMUNITY)
Admission: RE | Admit: 2018-04-26 | Discharge: 2018-04-26 | Disposition: A | Discharge status: Home / Self Care | Payer: Medicare HMO | Source: Ambulatory Visit | Attending: Physician Assistant | Admitting: Physician Assistant

## 2018-04-26 ENCOUNTER — Inpatient Hospital Stay: Admission: RE | Admit: 2018-04-26 | Payer: Medicare HMO | Source: Ambulatory Visit

## 2018-04-26 DIAGNOSIS — K56600 Partial intestinal obstruction, unspecified as to cause: Secondary | ICD-10-CM | POA: Insufficient documentation

## 2018-04-26 DIAGNOSIS — Z9049 Acquired absence of other specified parts of digestive tract: Secondary | ICD-10-CM | POA: Insufficient documentation

## 2018-04-26 DIAGNOSIS — Z933 Colostomy status: Secondary | ICD-10-CM | POA: Insufficient documentation

## 2018-05-16 ENCOUNTER — Encounter: Payer: Self-pay | Admitting: Gastroenterology

## 2018-05-16 ENCOUNTER — Telehealth: Payer: Self-pay | Admitting: Physician Assistant

## 2018-05-16 NOTE — Telephone Encounter (Signed)
Please advise. Thank you

## 2018-05-16 NOTE — Telephone Encounter (Signed)
Patient states due to her kidney problems she thought she was told not to take suprep for colon on 8.22.19. Patient is wanting to verify and discuss what to do if she can not take prep. Patient had ov on 7.19.19.

## 2018-05-16 NOTE — Telephone Encounter (Signed)
We can do Plenvu or Miralax prep + dulcolax tabs instead of mag citrate. Thanks

## 2018-05-17 ENCOUNTER — Other Ambulatory Visit: Payer: Self-pay

## 2018-05-17 MED ORDER — PEG-KCL-NACL-NASULF-NA ASC-C 140 G PO SOLR: 1.0000 | ORAL | 0 refills | Status: DC

## 2018-05-17 NOTE — Telephone Encounter (Signed)
Can you advise this patient?  Please and thank you.

## 2018-05-17 NOTE — Telephone Encounter (Signed)
Patient contacted. She says she will not do large volume. Plenvu instructions given with a sample of Plenvu.

## 2018-05-23 ENCOUNTER — Encounter: Payer: Self-pay | Admitting: Gastroenterology

## 2018-05-23 ENCOUNTER — Ambulatory Visit (AMBULATORY_SURGERY_CENTER): Payer: Medicare HMO | Admitting: Gastroenterology

## 2018-05-23 VITALS — BP 108/80 | HR 73 | Temp 99.1°F | Resp 18 | Ht 64.0 in | Wt 120.0 lb

## 2018-05-23 DIAGNOSIS — Z85048 Personal history of other malignant neoplasm of rectum, rectosigmoid junction, and anus: Secondary | ICD-10-CM | POA: Diagnosis present

## 2018-05-23 DIAGNOSIS — D175 Benign lipomatous neoplasm of intra-abdominal organs: Secondary | ICD-10-CM

## 2018-05-23 MED ORDER — SODIUM CHLORIDE 0.9 % IV SOLN: 500.0000 mL | Freq: Once | INTRAVENOUS | Status: DC

## 2018-05-23 NOTE — Progress Notes (Signed)
A/ox3 pleased with MAC, report to RN 

## 2018-05-23 NOTE — Op Note (Signed)
[facility] Patient Name: Desiree Adams Procedure Date: 05/23/2018 3:06 PM MRN: [id number] Endoscopist: Mauri Pole , MD Age: 72 Referring MD:  Date of Birth: [birth year] Gender: Female Account #: [id number] Procedure:                Colonoscopy Indications:              History of anal melanoma, s/p diverting colostomy,                            history of radiation proctitis, multiple partial                            bowel obstructions Medicines:                Monitored Anesthesia Care Procedure:                Pre-Anesthesia Assessment:                           - Prior to the procedure, a History and Physical                            was performed, and patient medications and                            allergies were reviewed. The patient's tolerance of                            previous anesthesia was also reviewed. The risks                            and benefits of the procedure and the sedation                            options and risks were discussed with the patient.                            All questions were answered, and informed consent                            was obtained. Prior Anticoagulants: The patient has                            taken no previous anticoagulant or antiplatelet                            agents. ASA Grade Assessment: III - A patient with                            severe systemic disease. After reviewing the risks                            and benefits, the patient was deemed in  satisfactory condition to undergo the procedure.                           After obtaining informed consent, the colonoscope                            was passed under direct vision. Throughout the                            procedure, the patient's blood pressure, pulse, and                            oxygen saturations were monitored continuously. The                            Model PCF-[id number]) scope  was introduced                            through the descending colostomy and advanced to                            the the ileocolonic anastomosis. The colonoscopy                            was performed without difficulty. The patient                            tolerated the procedure well. The quality of the                            bowel preparation was excellent. Ileocolonic                            anastomosis were photographed. Scope In: 3:24:02 PM Scope Out: 3:35:10 PM Scope Withdrawal Time: 0 hours 8 minutes 32 seconds  Total Procedure Duration: 0 hours 11 minutes 8 seconds  Findings:                 There was evidence of a widely patent diverting                            loop colostomy in the descending colon. This was                            characterized by healthy appearing mucosa in the                            colon and visualized small bowel.                           There was a medium-sized lipoma, 15 mm in diameter,                            in the transverse colon. Biopsies were taken with a  cold forceps for histology.                           Scattered small and large-mouthed diverticula were                            found in the descending colon, transverse colon and                            ascending colon. Complications:            No immediate complications. Estimated Blood Loss:     Estimated blood loss was minimal. Impression:               - Widely patent loop colostomy with healthy                            appearing mucosa in the descending colon.                           - Medium-sized lipoma in the transverse colon.                            Biopsied.                           - Moderate diverticulosis in the descending colon,                            in the transverse colon and in the ascending colon. Recommendation:           - Patient has a contact number available for                             emergencies. The signs and symptoms of potential                            delayed complications were discussed with the                            patient. Return to normal activities tomorrow.                            Written discharge instructions were provided to the                            patient.                           - Resume previous diet.                           - Continue present medications.                           - Await pathology results.                           -  No repeat colonoscopy due to the absence of                            advanced adenomas. Mauri Pole, MD 05/23/2018 3:56:25 PM This report has been signed electronically.

## 2018-05-23 NOTE — Progress Notes (Signed)
Called to room to assist during endoscopic procedure.  Patient ID and intended procedure confirmed with present staff. Received instructions for my participation in the procedure from the performing physician.  

## 2018-05-23 NOTE — Patient Instructions (Signed)
YOU HAD AN ENDOSCOPIC PROCEDURE TODAY AT THE [facility]:   Refer to the procedure report that was given to you for any specific questions about what was found during the examination.  If the procedure report does not answer your questions, please call your gastroenterologist to clarify.  If you requested that your care partner not be given the details of your procedure findings, then the procedure report has been included in a sealed envelope for you to review at your convenience later.  YOU SHOULD EXPECT: Some feelings of bloating in the abdomen. Passage of more gas than usual.  Walking can help get rid of the air that was put into your GI tract during the procedure and reduce the bloating. If you had a lower endoscopy (such as a colonoscopy or flexible sigmoidoscopy) you may notice spotting of blood in your stool or on the toilet paper. If you underwent a bowel prep for your procedure, you may not have a normal bowel movement for a few days.  Please Note:  You might notice some irritation and congestion in your nose or some drainage.  This is from the oxygen used during your procedure.  There is no need for concern and it should clear up in a day or so.  SYMPTOMS TO REPORT IMMEDIATELY:   Following lower endoscopy (colonoscopy or flexible sigmoidoscopy):  Excessive amounts of blood in the stool  Significant tenderness or worsening of abdominal pains  Swelling of the abdomen that is new, acute  Fever of 100F or higher   For urgent or emergent issues, a gastroenterologist can be reached at any hour by calling [phone].   DIET:  We do recommend a small meal at first, but then you may proceed to your regular diet.  Drink plenty of fluids but you should avoid alcoholic beverages for 24 hours.  ACTIVITY:  You should plan to take it easy for the rest of today and you should NOT DRIVE or use heavy machinery until tomorrow (because of the sedation medicines used during the test).     FOLLOW UP: Our staff will call the number listed on your records the next business day following your procedure to check on you and address any questions or concerns that you may have regarding the information given to you following your procedure. If we do not reach you, we will leave a message.  However, if you are feeling well and you are not experiencing any problems, there is no need to return our call.  We will assume that you have returned to your regular daily activities without incident.  If any biopsies were taken you will be contacted by phone or by letter within the next 1-3 weeks.  Please call us at [phone] if you have not heard about the biopsies in 3 weeks.    SIGNATURES/CONFIDENTIALITY: You and/or your care partner have signed paperwork which will be entered into your electronic medical record.  These signatures attest to the fact that that the information above on your After Visit Summary has been reviewed and is understood.  Full responsibility of the confidentiality of this discharge information lies with you and/or your care-partner.  Thank you for letting us take care of your healthcare needs today. 

## 2018-05-24 ENCOUNTER — Telehealth: Payer: Self-pay

## 2018-05-24 NOTE — Telephone Encounter (Signed)
Left message

## 2018-05-29 ENCOUNTER — Telehealth: Payer: Self-pay | Admitting: Gastroenterology

## 2018-06-05 ENCOUNTER — Emergency Department (HOSPITAL_BASED_OUTPATIENT_CLINIC_OR_DEPARTMENT_OTHER): Payer: Medicare HMO

## 2018-06-05 ENCOUNTER — Observation Stay (HOSPITAL_BASED_OUTPATIENT_CLINIC_OR_DEPARTMENT_OTHER)
Admission: EM | Admit: 2018-06-05 | Discharge: 2018-06-05 | Discharge status: Against Medical Advice | Payer: Medicare HMO | Attending: Internal Medicine | Admitting: Internal Medicine

## 2018-06-05 ENCOUNTER — Emergency Department (HOSPITAL_BASED_OUTPATIENT_CLINIC_OR_DEPARTMENT_OTHER)
Admission: EM | Admit: 2018-06-05 | Discharge: 2018-06-05 | Disposition: A | Discharge status: Home / Self Care | Payer: Medicare HMO | Source: Home / Self Care | Attending: Emergency Medicine | Admitting: Emergency Medicine

## 2018-06-05 ENCOUNTER — Encounter (HOSPITAL_BASED_OUTPATIENT_CLINIC_OR_DEPARTMENT_OTHER): Payer: Self-pay | Admitting: Emergency Medicine

## 2018-06-05 ENCOUNTER — Other Ambulatory Visit: Payer: Self-pay

## 2018-06-05 DIAGNOSIS — N189 Chronic kidney disease, unspecified: Secondary | ICD-10-CM | POA: Insufficient documentation

## 2018-06-05 DIAGNOSIS — K56609 Unspecified intestinal obstruction, unspecified as to partial versus complete obstruction: Principal | ICD-10-CM | POA: Insufficient documentation

## 2018-06-05 DIAGNOSIS — J45909 Unspecified asthma, uncomplicated: Secondary | ICD-10-CM | POA: Insufficient documentation

## 2018-06-05 DIAGNOSIS — Z8052 Family history of malignant neoplasm of bladder: Secondary | ICD-10-CM | POA: Insufficient documentation

## 2018-06-05 DIAGNOSIS — Z882 Allergy status to sulfonamides status: Secondary | ICD-10-CM | POA: Insufficient documentation

## 2018-06-05 DIAGNOSIS — Z8543 Personal history of malignant neoplasm of ovary: Secondary | ICD-10-CM | POA: Diagnosis not present

## 2018-06-05 DIAGNOSIS — Z8249 Family history of ischemic heart disease and other diseases of the circulatory system: Secondary | ICD-10-CM | POA: Insufficient documentation

## 2018-06-05 DIAGNOSIS — Z933 Colostomy status: Secondary | ICD-10-CM | POA: Insufficient documentation

## 2018-06-05 DIAGNOSIS — Z79899 Other long term (current) drug therapy: Secondary | ICD-10-CM | POA: Insufficient documentation

## 2018-06-05 DIAGNOSIS — E538 Deficiency of other specified B group vitamins: Secondary | ICD-10-CM | POA: Insufficient documentation

## 2018-06-05 DIAGNOSIS — Z7982 Long term (current) use of aspirin: Secondary | ICD-10-CM | POA: Insufficient documentation

## 2018-06-05 DIAGNOSIS — Z9882 Breast implant status: Secondary | ICD-10-CM | POA: Diagnosis not present

## 2018-06-05 DIAGNOSIS — Z8051 Family history of malignant neoplasm of kidney: Secondary | ICD-10-CM | POA: Diagnosis not present

## 2018-06-05 DIAGNOSIS — Z923 Personal history of irradiation: Secondary | ICD-10-CM | POA: Diagnosis not present

## 2018-06-05 DIAGNOSIS — Z85110 Personal history of malignant carcinoid tumor of bronchus and lung: Secondary | ICD-10-CM | POA: Diagnosis not present

## 2018-06-05 DIAGNOSIS — K219 Gastro-esophageal reflux disease without esophagitis: Secondary | ICD-10-CM | POA: Diagnosis not present

## 2018-06-05 DIAGNOSIS — R112 Nausea with vomiting, unspecified: Secondary | ICD-10-CM

## 2018-06-05 DIAGNOSIS — F419 Anxiety disorder, unspecified: Secondary | ICD-10-CM | POA: Insufficient documentation

## 2018-06-05 DIAGNOSIS — Z8673 Personal history of transient ischemic attack (TIA), and cerebral infarction without residual deficits: Secondary | ICD-10-CM | POA: Insufficient documentation

## 2018-06-05 DIAGNOSIS — Z9049 Acquired absence of other specified parts of digestive tract: Secondary | ICD-10-CM | POA: Diagnosis not present

## 2018-06-05 DIAGNOSIS — M3500 Sicca syndrome, unspecified: Secondary | ICD-10-CM | POA: Insufficient documentation

## 2018-06-05 DIAGNOSIS — Z8261 Family history of arthritis: Secondary | ICD-10-CM | POA: Diagnosis not present

## 2018-06-05 DIAGNOSIS — Z90710 Acquired absence of both cervix and uterus: Secondary | ICD-10-CM | POA: Diagnosis not present

## 2018-06-05 DIAGNOSIS — R109 Unspecified abdominal pain: Secondary | ICD-10-CM | POA: Diagnosis present

## 2018-06-05 DIAGNOSIS — Z823 Family history of stroke: Secondary | ICD-10-CM | POA: Insufficient documentation

## 2018-06-05 DIAGNOSIS — N200 Calculus of kidney: Secondary | ICD-10-CM | POA: Diagnosis not present

## 2018-06-05 LAB — BASIC METABOLIC PANEL
ANION GAP: 9 (ref 5–15)
ANION GAP: 11 (ref 5–15)
BUN: 27 mg/dL — AB (ref 8–23)
BUN: 28 mg/dL — ABNORMAL HIGH (ref 8–23)
CALCIUM: 8.1 mg/dL — AB (ref 8.9–10.3)
CHLORIDE: 112 mmol/L — AB (ref 98–111)
CO2: 20 mmol/L — ABNORMAL LOW (ref 22–32)
CO2: 17 mmol/L — AB (ref 22–32)
CREATININE: 2.34 mg/dL — AB (ref 0.44–1.00)
Calcium: 8.4 mg/dL — ABNORMAL LOW (ref 8.9–10.3)
Chloride: 110 mmol/L (ref 98–111)
Creatinine, Ser: 2.1 mg/dL — ABNORMAL HIGH (ref 0.44–1.00)
GFR, EST AFRICAN AMERICAN: 26 mL/min — AB (ref >60)
GFR, EST AFRICAN AMERICAN: 23 mL/min — AB (ref >60)
GFR, EST NON AFRICAN AMERICAN: 23 mL/min — AB (ref >60)
GFR, EST NON AFRICAN AMERICAN: 20 mL/min — AB (ref >60)
Glucose, Bld: 100 mg/dL — ABNORMAL HIGH (ref 70–99)
Glucose, Bld: 106 mg/dL — ABNORMAL HIGH (ref 70–99)
POTASSIUM: 3.4 mmol/L — AB (ref 3.5–5.1)
Potassium: 3.9 mmol/L (ref 3.5–5.1)
SODIUM: 141 mmol/L (ref 135–145)
SODIUM: 138 mmol/L (ref 135–145)

## 2018-06-05 LAB — CBC WITH DIFFERENTIAL/PLATELET
BASOS ABS: 0 10*3/uL (ref 0.0–0.1)
Basophils Absolute: 0 10*3/uL (ref 0.0–0.1)
Basophils Relative: 0 %
Basophils Relative: 0 %
EOS ABS: 0.3 10*3/uL (ref 0.0–0.7)
EOS PCT: 3 %
EOS PCT: 1 %
Eosinophils Absolute: 0 10*3/uL (ref 0.0–0.7)
HCT: 39.3 % (ref 36.0–46.0)
HEMATOCRIT: 38.8 % (ref 36.0–46.0)
HEMOGLOBIN: 13.3 g/dL (ref 12.0–15.0)
HEMOGLOBIN: 13.2 g/dL (ref 12.0–15.0)
LYMPHS ABS: 2.4 10*3/uL (ref 0.7–4.0)
LYMPHS ABS: 1.2 10*3/uL (ref 0.7–4.0)
LYMPHS PCT: 26 %
LYMPHS PCT: 14 %
MCH: 30.6 pg (ref 26.0–34.0)
MCH: 30.8 pg (ref 26.0–34.0)
MCHC: 33.8 g/dL (ref 30.0–36.0)
MCHC: 34 g/dL (ref 30.0–36.0)
MCV: 90.3 fL (ref 78.0–100.0)
MCV: 90.7 fL (ref 78.0–100.0)
Monocytes Absolute: 0.7 10*3/uL (ref 0.1–1.0)
Monocytes Absolute: 0.5 10*3/uL (ref 0.1–1.0)
Monocytes Relative: 7 %
Monocytes Relative: 7 %
NEUTROS ABS: 6.6 10*3/uL (ref 1.7–7.7)
NEUTROS PCT: 64 %
NEUTROS PCT: 78 %
Neutro Abs: 5.9 10*3/uL (ref 1.7–7.7)
PLATELETS: 276 10*3/uL (ref 150–400)
PLATELETS: 276 10*3/uL (ref 150–400)
RBC: 4.35 MIL/uL (ref 3.87–5.11)
RBC: 4.28 MIL/uL (ref 3.87–5.11)
RDW: 14.8 % (ref 11.5–15.5)
RDW: 14.9 % (ref 11.5–15.5)
WBC: 9.3 10*3/uL (ref 4.0–10.5)
WBC: 8.4 10*3/uL (ref 4.0–10.5)

## 2018-06-05 MED ORDER — SODIUM CHLORIDE 0.9 % IV BOLUS
1000.0000 mL | Freq: Once | INTRAVENOUS | Status: AC
  Administered 2018-06-05: 1000 mL via INTRAVENOUS

## 2018-06-05 MED ORDER — ONDANSETRON HCL 4 MG/2ML IJ SOLN
4.0000 mg | Freq: Once | INTRAMUSCULAR | Status: AC
  Administered 2018-06-05: 4 mg via INTRAVENOUS
  Filled 2018-06-05: qty 2

## 2018-06-05 MED ORDER — SODIUM CHLORIDE 0.9 % IV BOLUS
500.0000 mL | Freq: Once | INTRAVENOUS | Status: AC
  Administered 2018-06-05: 500 mL via INTRAVENOUS

## 2018-06-05 MED ORDER — SODIUM CHLORIDE 0.9 % IV SOLN: INTRAVENOUS | Status: DC

## 2018-06-05 NOTE — ED Notes (Signed)
Patient co bilateral leg cramping; denies any other pains or abd pain; no emesis noted.

## 2018-06-05 NOTE — ED Notes (Signed)
Patient transported to CT 

## 2018-06-05 NOTE — ED Provider Notes (Signed)
[facility] EMERGENCY DEPARTMENT Provider Note   CSN: [id number] Arrival date & time: 06/05/18  2025     History   Chief Complaint Chief Complaint  Patient presents with  . Abdominal Pain  . Emesis    HPI Desiree Adams is a 72 y.o. female.  Patient is a 72 year old female who has a colostomy in place and has had recurrent bowel obstructions.  She was here in the emergency department early this morning with abdominal pain, bloating and vomiting.  She had a CT scan which showed a small bowel obstruction.  Surgery was consulted as well as the hospitalist and the plan was to admit the patient to the hospitalist service with surgery consulting.  However patient left AMA.  She states that her obstruction usually self resolved.  She does say that this morning the obstruction resolved and she had a plug of appeared in her colostomy bag.  She has not had any further output from her colostomy but she feels as because she has not eaten anything.  She has had some ongoing soreness across her upper abdomen although the pain has improved.  She has had ongoing nausea and vomiting throughout the day.  No known fevers.     Past Medical History:  Diagnosis Date  . Anxiety   . Asthma    pt denies  . Biliary colic   . Carcinoid tumor, bronchus, lung, malignant ([place])   . Difficult intubation   . GERD (gastroesophageal reflux disease)   . History of Clostridium difficile infection   . Other and unspecified noninfectious gastroenteritis and colitis(558.9) 01/2001   nonspecific colitis most likely from radiation  . Ovarian cancer ([place])   . PONV (postoperative nausea and vomiting)    always nausea and  vomiting  . Renal disorder   . Right lower lobe lung mass   . Small bowel obstruction ([place])   . Stroke ([place])   . Urinary tract infection   . Vitamin B12 deficiency     Patient Active Problem List   Diagnosis Date Noted  . Sjogren's syndrome ([place]) 01/01/2018  . 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 SBO (small bowel obstruction) ([place]) 10/24/2016  . 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 Thrombosed external hemorrhoid-right 10/28/2012  . Right lower lobe lung mass   . 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 Rectal ulcer 12/25/2011  . 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 VITAMIN B12 DEFICIENCY 11/11/2008  . BILIARY COLIC 65/68/1275  . DIARRHEA, CHRONIC 11/11/2008  . NEOPLASM, MALIGNANT, OVARY, HX OF 11/11/2008  . Personal history of other diseases of digestive system 11/11/2008    Past Surgical History:  Procedure Laterality Date  . APPENDECTOMY    . BREAST ENHANCEMENT SURGERY    . COLONOSCOPY  12/25/2011   Procedure: COLONOSCOPY;  Surgeon: Lafayette Dragon, MD;  Location: [facility];  Service: Endoscopy;  Laterality: N/A;  . COLOSTOMY  2013  . EXPLORATORY LAPAROTOMY W/ BOWEL RESECTION    . HEMORRHOID SURGERY N/A 06/04/2013   Procedure: single coulum HEMORRHOIDECTOMY;  Surgeon: Odis Hollingshead, MD;  Location: [facility]S;  Service: General;  Laterality: N/A;  . LAPAROSCOPIC CHOLECYSTECTOMY    . LUNG REMOVAL, PARTIAL    . rectum removal    . TOTAL ABDOMINAL HYSTERECTOMY W/ BILATERAL SALPINGOOPHORECTOMY    . VIDEO ASSISTED THORACOSCOPY  04/25/12   right     OB History   None      Home Medications    Prior to Admission medications  Medication Sig Start Date End Date Taking? Authorizing Provider  aspirin EC 325 MG EC tablet Take 1 tablet (325 mg total) by mouth daily. 10/29/17   Rinehuls, Early Chars, PA-C  busPIRone (BUSPAR) 5 MG tablet Take 5 mg by mouth daily. 04/18/18   [provider]  cephALEXin (KEFLEX) 500 MG capsule Take 1 capsule (500 mg total) by mouth 2 (two) times daily. Patient not taking: Reported on 05/23/2018 02/17/18   Ward, Delice Bison, DO  metoCLOPramide (REGLAN) 10 MG tablet Take 1 tablet (10 mg total) by mouth every 8 (eight) hours as needed for nausea or vomiting (nausea/headache). Patient not taking: Reported on 05/23/2018 09/07/15   Orlie Dakin, MD  Multiple Vitamin (MULTI-VITAMIN PO) Take 5 mLs by mouth See admin instructions. Mix 5 mls multivitamin vegetable powder in water and drink    [provider]  omeprazole (PRILOSEC) 20 MG capsule Take 20 mg by mouth at bedtime.    [provider]  polyvinyl alcohol (ARTIFICIAL TEARS) 1.4 % ophthalmic solution Place 1 drop into both eyes at bedtime.    [provider]    Family History Family History  Problem Relation Age of Onset  . Stroke Mother   . Rheum arthritis Mother   . Hypertension Mother   . Kidney cancer Unknown        ???? Grandfather  . Bladder Cancer Maternal Grandfather   . Stroke Maternal Grandmother   . Healthy Son   . Colon cancer Neg Hx     Social History Social History   Tobacco Use  . Smoking status: Never Smoker  . Smokeless tobacco: Never Used  Substance Use Topics  . Alcohol use: Yes    Alcohol/week: 1.0 standard drinks    Types: 1 Glasses of wine per week    Comment: very rarely  . Drug use: No     Allergies   Other; Sulfamethoxazole; and Sulfa antibiotics   Review of Systems Review of Systems  Constitutional: Negative for chills, diaphoresis, fatigue and fever.  HENT: Negative for congestion, rhinorrhea and sneezing.   Eyes: Negative.   Respiratory: Negative for cough, chest tightness and shortness of breath.   Cardiovascular: Negative for chest pain and leg swelling.  Gastrointestinal: Positive for abdominal pain, nausea and vomiting. Negative for blood in stool and diarrhea.  Genitourinary: Negative for difficulty urinating, flank pain, frequency and hematuria.  Musculoskeletal: Negative for arthralgias and back pain.  Skin: Negative for rash.  Neurological: Negative for dizziness, speech difficulty, weakness, numbness and headaches.     Physical Exam Updated Vital Signs BP 127/73 (BP Location: Right Arm)   Pulse 88   Temp 98.3 F (36.8 C) (Oral)   Resp 18   SpO2 98%   Physical Exam    Constitutional: She is oriented to person, place, and time. She appears well-developed and well-nourished.  HENT:  Head: Normocephalic and atraumatic.  Eyes: Pupils are equal, round, and reactive to light.  Neck: Normal range of motion. Neck supple.  Cardiovascular: Normal rate, regular rhythm and normal heart sounds.  Pulmonary/Chest: Effort normal and breath sounds normal. No respiratory distress. She has no wheezes. She has no rales. She exhibits no tenderness.  Abdominal: Soft. Bowel sounds are decreased. There is tenderness in the right upper quadrant, periumbilical area and left upper quadrant. There is no rebound and no guarding.  Musculoskeletal: Normal range of motion. She exhibits no edema.  Lymphadenopathy:    She has no cervical adenopathy.  Neurological: She is alert and  oriented to person, place, and time.  Skin: Skin is warm and dry. No rash noted.  Psychiatric: She has a normal mood and affect.     ED Treatments / Results  Labs (all labs ordered are listed, but only abnormal results are displayed) Labs Reviewed  BASIC METABOLIC PANEL - Abnormal; Notable for the following components:      Result Value   CO2 17 (*)    Glucose, Bld 106 (*)    BUN 28 (*)    Creatinine, Ser 2.34 (*)    Calcium 8.1 (*)    GFR calc non Af Amer 20 (*)    GFR calc Af Amer 23 (*)    All other components within normal limits  CBC WITH DIFFERENTIAL/PLATELET    EKG None  Radiology Ct Renal Stone Study  Result Date: 06/05/2018 CLINICAL DATA:  Infraumbilical pain for 1 day. Nausea and vomiting. Multiple previous small-bowel obstructions. Multiple surgeries for small bowel resections. Previous x-ray therapy for ovarian cancer. EXAM: CT ABDOMEN AND PELVIS WITHOUT CONTRAST TECHNIQUE: Multidetector CT imaging of the abdomen and pelvis was performed following the standard protocol without IV contrast. COMPARISON:  02/17/2018 FINDINGS: Lower chest: Bilateral breast implants. Lung bases are clear.  Hepatobiliary: No focal liver abnormality is seen. Status post cholecystectomy. No biliary dilatation. Pancreas: Unremarkable. No pancreatic ductal dilatation or surrounding inflammatory changes. Spleen: Normal in size without focal abnormality. Adrenals/Urinary Tract: No adrenal gland nodules. Mild bilateral renal atrophy. Nonobstructing stones in the left kidney. No hydronephrosis or hydroureter. Bladder is unremarkable. Stomach/Bowel: Stomach and small bowel are distended and fluid-filled. Transition zone to decompressed distal terminal ileum. Changes consistent with small bowel obstruction. Ileal colonic anastomosis with partial resection. Left lower quadrant descending colostomy with distal colon resection. No significant bowel wall thickening or pneumatosis. Appearances are similar to previous studies. Vascular/Lymphatic: No significant vascular findings are present. No enlarged abdominal or pelvic lymph nodes. Reproductive: Status post hysterectomy. No adnexal masses. Surgical clips and calcifications in the pelvis. Soft tissue thickening in the presacral space likely results from radiation change. No loculated collections. Other: No free air or free fluid in the abdomen. Musculoskeletal: Diffuse bone demineralization and trabecular changes in the sacrum and pelvis possibly related to radiation change. Sclerosis and mild lucency in the femoral heads may reflect degenerative change or early avascular necrosis. Hypertrophic changes in the symphysis pubis. Focal sclerosis in the left iliac bone without change, likely benign. IMPRESSION: 1. Small bowel obstruction with transition zone at the distal terminal ileum. 2. Left lower quadrant descending colostomy with distal colon resection. No evidence of bowel wall thickening or pneumatosis. 3. No evidence of metastatic disease in the abdomen or pelvis. 4. Diffuse bone demineralization and trabecular changes likely related to radiation change. 5. Nonobstructing  stones in the left kidney. Electronically Signed   By: Lucienne Capers M.D.   On: 06/05/2018 05:41    Procedures Procedures (including critical care time)  Medications Ordered in ED Medications  sodium chloride 0.9 % bolus 500 mL (500 mLs Intravenous New Bag/Given 06/05/18 2258)  sodium chloride 0.9 % bolus 500 mL ( Intravenous Stopped 06/05/18 2205)  ondansetron (ZOFRAN) injection 4 mg (4 mg Intravenous Given 06/05/18 2106)     Initial Impression / Assessment and Plan / ED Course  I have reviewed the triage vital signs and the nursing notes.  Pertinent labs & imaging results that were available during my care of the patient were reviewed by me and considered in my medical decision making (see chart  for details).     Patient is a 72 year old female who presents with nausea and vomiting.  She was seen here early this morning and had a small bowel obstruction.  She feels like it is clear but she has had some ongoing vomiting.  I did advise her that if she has ongoing vomiting, likely obstruction has not cleared.  She however is refusing NG tube.  She was given Zofran and and IV fluids.  She is feeling better.  She has no ongoing vomiting.  She is able to tolerate oral fluids.  She has no ongoing abdominal pain.  She is refusing admission.  She states she will return if she has ongoing or worsening symptoms.  Her labs are similar to baseline values.  She was discharged home.  She was given strict return precautions.  She was encouraged to follow-up with her PCP.  Final Clinical Impressions(s) / ED Diagnoses   Final diagnoses:  Non-intractable vomiting with nausea, unspecified vomiting type    ED Discharge Orders    None       Malvin Johns, MD 06/05/18 2319

## 2018-06-05 NOTE — ED Notes (Signed)
Went in to discuss the NG tube with pt  Pt states she does not want one at this time  Pt states her small bowel obstructions always resolve on their own  EDP Palumbo came into room to discuss her treatment plan of the NG tube and admission into the hospital  Pt states she does not want to be admitted into the hospital and that she wants to go home  Pt states she does not do well in the hospital and she feels her obstruction will resolve itself as it always does  EDP Palumbo discussed the risks of leaving against medical advise  Pt remains adamant that she is not going to be admitted

## 2018-06-05 NOTE — Plan of Care (Addendum)
72 yo F with CKD, h/o SBOs, ovarian cancer, carcinoid tumor of lung, anal cancer.  Comes in with N/V.  CT confirms SBO.  EDP spoke with Dr. Redmond Pulling who will consult on case.  NGT ordered.  Putting in for med surg.

## 2018-06-05 NOTE — ED Provider Notes (Signed)
[facility] EMERGENCY DEPARTMENT Provider Note   CSN: [id number] Arrival date & time: 06/05/18  1607     History   Chief Complaint Chief Complaint  Patient presents with  . Abdominal Pain    HPI Desiree Adams is a 72 y.o. female.  The history is provided by the patient.  Abdominal Pain   This is a recurrent problem. The current episode started 12 to 24 hours ago. The problem occurs constantly. The problem has not changed since onset.The pain is associated with a previous surgery. The pain is located in the generalized abdominal region. The quality of the pain is cramping. The pain is severe. Associated symptoms include nausea and vomiting. Pertinent negatives include fever and diarrhea. Nothing aggravates the symptoms. Nothing relieves the symptoms. Past workup includes GI consult, CT scan and surgery.  Patient with complex PMH including CKD and ostomy who presents with recurrent diffuse abdominal cramping.  She has associated n/v and decreased ostomy output.  She states this feels similar to previous obstructions which resolve in the ED.  She had a colonoscopy approximately 2 weeks ago and they were unable to tell her why this keeps happening.  She has taken one zofran in the past 24 hours.    Past Medical History:  Diagnosis Date  . Anxiety   . Asthma    pt denies  . Biliary colic   . Carcinoid tumor, bronchus, lung, malignant ([place])   . Difficult intubation   . GERD (gastroesophageal reflux disease)   . History of Clostridium difficile infection   . Other and unspecified noninfectious gastroenteritis and colitis(558.9) 01/2001   nonspecific colitis most likely from radiation  . Ovarian cancer ([place])   . PONV (postoperative nausea and vomiting)    always nausea and  vomiting  . Renal disorder   . Right lower lobe lung mass   . Small bowel obstruction ([place])   . Stroke ([place])   . Urinary tract infection   . Vitamin B12 deficiency     Patient Active Problem List   Diagnosis Date Noted  . Sjogren's syndrome ([place]) 01/01/2018  . 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 SBO (small bowel obstruction) ([place]) 10/24/2016  . 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 Thrombosed external hemorrhoid-right 10/28/2012  . Right lower lobe lung mass   . 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 Rectal ulcer 12/25/2011  . 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 VITAMIN B12 DEFICIENCY 11/11/2008  . BILIARY COLIC 37/07/6268  . DIARRHEA, CHRONIC 11/11/2008  . NEOPLASM, MALIGNANT, OVARY, HX OF 11/11/2008  . Personal history of other diseases of digestive system 11/11/2008    Past Surgical History:  Procedure Laterality Date  . APPENDECTOMY    . BREAST ENHANCEMENT SURGERY    . COLONOSCOPY  12/25/2011   Procedure: COLONOSCOPY;  Surgeon: Lafayette Dragon, MD;  Location: [facility];  Service: Endoscopy;  Laterality: N/A;  . COLOSTOMY  2013  . EXPLORATORY LAPAROTOMY W/ BOWEL RESECTION    . HEMORRHOID SURGERY N/A 06/04/2013   Procedure: single coulum HEMORRHOIDECTOMY;  Surgeon: Odis Hollingshead, MD;  Location: [facility];  Service: General;  Laterality: N/A;  . LAPAROSCOPIC CHOLECYSTECTOMY    . LUNG REMOVAL, PARTIAL    . rectum removal    . TOTAL ABDOMINAL HYSTERECTOMY W/ BILATERAL SALPINGOOPHORECTOMY    . VIDEO ASSISTED THORACOSCOPY  04/25/12   right     OB History   None      Home Medications    Prior to Admission  medications   Medication Sig Start Date End Date Taking? Authorizing Provider  aspirin EC 325 MG EC tablet Take 1 tablet (325 mg total) by mouth daily. 10/29/17   Rinehuls, Early Chars, PA-C  busPIRone (BUSPAR) 5 MG tablet Take 5 mg by mouth daily. 04/18/18   [provider]  cephALEXin (KEFLEX) 500 MG capsule Take 1 capsule (500 mg total) by mouth 2 (two) times daily. Patient not taking: Reported on 05/23/2018 02/17/18   Ward, Delice Bison, DO  metoCLOPramide (REGLAN) 10 MG tablet Take 1 tablet (10 mg total) by mouth every 8 (eight) hours as needed for nausea or  vomiting (nausea/headache). Patient not taking: Reported on 05/23/2018 09/07/15   Orlie Dakin, MD  Multiple Vitamin (MULTI-VITAMIN PO) Take 5 mLs by mouth See admin instructions. Mix 5 mls multivitamin vegetable powder in water and drink    [provider]  omeprazole (PRILOSEC) 20 MG capsule Take 20 mg by mouth at bedtime.    [provider]  polyvinyl alcohol (ARTIFICIAL TEARS) 1.4 % ophthalmic solution Place 1 drop into both eyes at bedtime.    [provider]    Family History Family History  Problem Relation Age of Onset  . Stroke Mother   . Rheum arthritis Mother   . Hypertension Mother   . Kidney cancer Unknown        ???? Grandfather  . Bladder Cancer Maternal Grandfather   . Stroke Maternal Grandmother   . Healthy Son   . Colon cancer Neg Hx     Social History Social History   Tobacco Use  . Smoking status: Never Smoker  . Smokeless tobacco: Never Used  Substance Use Topics  . Alcohol use: Yes    Alcohol/week: 1.0 standard drinks    Types: 1 Glasses of wine per week    Comment: very rarely  . Drug use: No     Allergies   Other; Sulfamethoxazole; and Sulfa antibiotics   Review of Systems Review of Systems  Constitutional: Negative for fever.  Respiratory: Negative for shortness of breath.   Cardiovascular: Negative for chest pain, palpitations and leg swelling.  Gastrointestinal: Positive for abdominal pain, nausea and vomiting. Negative for diarrhea.  Genitourinary: Negative for flank pain.  All other systems reviewed and are negative.    Physical Exam Updated Vital Signs BP (!) 144/82 (BP Location: Left Arm)   Pulse 80   Temp 97.9 F (36.6 C) (Oral)   Resp 18   SpO2 98%   Physical Exam  Constitutional: She is oriented to person, place, and time. She appears well-developed and well-nourished. No distress.  HENT:  Head: Normocephalic and atraumatic.  Mouth/Throat: Oropharynx is clear and moist. No oropharyngeal  exudate.  Eyes: Pupils are equal, round, and reactive to light. Conjunctivae are normal.  Neck: Normal range of motion. Neck supple.  Cardiovascular: Normal rate, regular rhythm, normal heart sounds and intact distal pulses.  Pulmonary/Chest: Effort normal and breath sounds normal. No stridor. She has no wheezes. She has no rales.  Abdominal: She exhibits no mass. Bowel sounds are decreased. There is no rebound, no guarding, no tenderness at McBurney's point and negative Murphy's sign. No hernia.  Musculoskeletal: Normal range of motion.  Neurological: She is alert and oriented to person, place, and time. She displays normal reflexes.  Skin: Skin is warm and dry. Capillary refill takes less than 2 seconds.  Psychiatric: She has a normal mood and affect.     ED Treatments / Results  Labs (all labs  ordered are listed, but only abnormal results are displayed) Results for orders placed or performed during the hospital encounter of 06/05/18  CBC with Differential/Platelet  Result Value Ref Range   WBC 9.3 4.0 - 10.5 K/uL   RBC 4.35 3.87 - 5.11 MIL/uL   Hemoglobin 13.3 12.0 - 15.0 g/dL   HCT 39.3 36.0 - 46.0 %   MCV 90.3 78.0 - 100.0 fL   MCH 30.6 26.0 - 34.0 pg   MCHC 33.8 30.0 - 36.0 g/dL   RDW 14.8 11.5 - 15.5 %   Platelets 276 150 - 400 K/uL   Neutrophils Relative % 64 %   Neutro Abs 5.9 1.7 - 7.7 K/uL   Lymphocytes Relative 26 %   Lymphs Abs 2.4 0.7 - 4.0 K/uL   Monocytes Relative 7 %   Monocytes Absolute 0.7 0.1 - 1.0 K/uL   Eosinophils Relative 3 %   Eosinophils Absolute 0.3 0.0 - 0.7 K/uL   Basophils Relative 0 %   Basophils Absolute 0.0 0.0 - 0.1 K/uL  Basic metabolic panel  Result Value Ref Range   Sodium 141 135 - 145 mmol/L   Potassium 3.4 (L) 3.5 - 5.1 mmol/L   Chloride 112 (H) 98 - 111 mmol/L   CO2 20 (L) 22 - 32 mmol/L   Glucose, Bld 100 (H) 70 - 99 mg/dL   BUN 27 (H) 8 - 23 mg/dL   Creatinine, Ser 2.10 (H) 0.44 - 1.00 mg/dL   Calcium 8.4 (L) 8.9 - 10.3 mg/dL     GFR calc non Af Amer 23 (L) >60 mL/min   GFR calc Af Amer 26 (L) >60 mL/min   Anion gap 9 5 - 15   Ct Renal Stone Study  Result Date: 06/05/2018 CLINICAL DATA:  Infraumbilical pain for 1 day. Nausea and vomiting. Multiple previous small-bowel obstructions. Multiple surgeries for small bowel resections. Previous x-ray therapy for ovarian cancer. EXAM: CT ABDOMEN AND PELVIS WITHOUT CONTRAST TECHNIQUE: Multidetector CT imaging of the abdomen and pelvis was performed following the standard protocol without IV contrast. COMPARISON:  02/17/2018 FINDINGS: Lower chest: Bilateral breast implants. Lung bases are clear. Hepatobiliary: No focal liver abnormality is seen. Status post cholecystectomy. No biliary dilatation. Pancreas: Unremarkable. No pancreatic ductal dilatation or surrounding inflammatory changes. Spleen: Normal in size without focal abnormality. Adrenals/Urinary Tract: No adrenal gland nodules. Mild bilateral renal atrophy. Nonobstructing stones in the left kidney. No hydronephrosis or hydroureter. Bladder is unremarkable. Stomach/Bowel: Stomach and small bowel are distended and fluid-filled. Transition zone to decompressed distal terminal ileum. Changes consistent with small bowel obstruction. Ileal colonic anastomosis with partial resection. Left lower quadrant descending colostomy with distal colon resection. No significant bowel wall thickening or pneumatosis. Appearances are similar to previous studies. Vascular/Lymphatic: No significant vascular findings are present. No enlarged abdominal or pelvic lymph nodes. Reproductive: Status post hysterectomy. No adnexal masses. Surgical clips and calcifications in the pelvis. Soft tissue thickening in the presacral space likely results from radiation change. No loculated collections. Other: No free air or free fluid in the abdomen. Musculoskeletal: Diffuse bone demineralization and trabecular changes in the sacrum and pelvis possibly related to radiation  change. Sclerosis and mild lucency in the femoral heads may reflect degenerative change or early avascular necrosis. Hypertrophic changes in the symphysis pubis. Focal sclerosis in the left iliac bone without change, likely benign. IMPRESSION: 1. Small bowel obstruction with transition zone at the distal terminal ileum. 2. Left lower quadrant descending colostomy with distal colon resection. No evidence of bowel  wall thickening or pneumatosis. 3. No evidence of metastatic disease in the abdomen or pelvis. 4. Diffuse bone demineralization and trabecular changes likely related to radiation change. 5. Nonobstructing stones in the left kidney. Electronically Signed   By: Lucienne Capers M.D.   On: 06/05/2018 05:41    Radiology No results found.  Procedures Procedures (including critical care time)  Medications Ordered in ED Medications  sodium chloride 0.9 % bolus 1,000 mL (1,000 mLs Intravenous New Bag/Given 06/05/18 0448)  ondansetron (ZOFRAN) injection 4 mg (has no administration in time range)     558 case d/w Dr. Redmond Pulling, call with any changes to admission [facility] to consult  Case d/w Dr. Alcario Drought who will admit the patient Final Clinical Impressions(s) / ED Diagnoses   Patient now stating she will not be admitted to the hospital as she " has other things to do."  She states she has just gotten out of the hospital. EDP states that given the degree of obstruction it is unlikely that without IV fluids and or NG tube that this will improve on its own.   Patient is AO4 and has decision making capacity to refuse care.  She states she has had this 40 times and it always resolves on its own.  ED stated that the risks of signing out against medication advice are but are not limited HY:WVPXT, sepsis from intestinal perforation, coma, need for surgery, prolonged pain and vomiting secondary to small bowel obstruction.  With nurse Lattie Haw present patient is advised and expresses understanding or these risks and of  signing out against medical advice.  She is welcome to return at any time.      Hanish Laraia, MD 06/05/18 [phone]

## 2018-06-05 NOTE — ED Triage Notes (Signed)
Pt is c/o vomiting and abd pain that started on Tuesday afternoon  Pt was seen here last night and was diagnosed with a small bowel obstruction  Pt left AMA this morning  Pt returns tonight says she has been vomiting all day and is now dehydrated

## 2018-06-05 NOTE — ED Triage Notes (Signed)
Pt is c/o abd pain with nausea and vomiting that started on Tuesday afternoon around 1pm  Pt states she thinks she has a bowel obstruction  Pt has hx of same  Pt actively gagging during triage

## 2018-06-05 NOTE — ED Notes (Signed)
Gave patient gingerale for po challenge.  

## 2018-07-23 ENCOUNTER — Encounter: Payer: Self-pay | Admitting: Gastroenterology

## 2018-07-23 ENCOUNTER — Ambulatory Visit (AMBULATORY_SURGERY_CENTER): Payer: Self-pay

## 2018-07-23 ENCOUNTER — Telehealth: Payer: Self-pay

## 2018-07-23 VITALS — Ht 63.0 in | Wt 122.3 lb

## 2018-07-23 DIAGNOSIS — Z86010 Personal history of colonic polyps: Secondary | ICD-10-CM

## 2018-07-23 MED ORDER — PEG-KCL-NACL-NASULF-NA ASC-C 140 G PO SOLR: 1.0000 | Freq: Once | ORAL | 0 refills | Status: AC

## 2018-07-23 NOTE — Progress Notes (Signed)
Denies allergies to eggs or soy products. Denies complication of anesthesia or sedation. Denies use of weight loss medication. Denies use of O2.   Emmi instructions declined.   Patient does not want to have another colonoscopy. A note sent to Dr. Silverio Decamp to help her understand th purpose of repeating this procedure. Will follow up with Dr. Silverio Decamp.

## 2018-07-23 NOTE — Telephone Encounter (Signed)
Dr. Silverio Decamp,  I saw Desiree Adams in [facility] today. 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 She said it wasn't cancerous. Patient states she is very weary after all of the procedures and health issues that  she has had. I told the patient that I was not sure why her physicians wanted her to have another procedure, but I did talk her into at least completing the pre-visit and I told her that I would contact you and maybe you could enlighten her why you want her to have this done again.   [name] was addiment that she NOT have a colonoscopy! I told her that it could be cancelled if we need too. Please advise Thanks!  Riki Sheer, LPN (PV )

## 2018-07-25 NOTE — Telephone Encounter (Signed)
Called patient twice in last 2 days and left a message to discuss.

## 2018-07-26 NOTE — Telephone Encounter (Signed)
Called patient and discussed that biopsies of the mucosa lining the lipoma came back as adenoma. Will need removal of the adenoma. She is willing to proceed with colonoscopy. She wants to wait until after Holidays and will call us to schedule the exam.

## 2018-07-26 NOTE — Telephone Encounter (Signed)
Patient returning phone call states she is very sorry to keep missing each other. States she works during the day and she would be available at noon to speak with Dr.Nandigam. Best call back [phone].

## 2018-07-29 NOTE — Telephone Encounter (Signed)
Please cancel the procedure scheduled for tomorrow. Thanks

## 2018-07-30 ENCOUNTER — Encounter: Payer: Medicare HMO | Admitting: Gastroenterology

## 2018-12-28 ENCOUNTER — Emergency Department (HOSPITAL_BASED_OUTPATIENT_CLINIC_OR_DEPARTMENT_OTHER): Payer: Medicare Other

## 2018-12-28 ENCOUNTER — Other Ambulatory Visit: Payer: Self-pay

## 2018-12-28 ENCOUNTER — Encounter (HOSPITAL_BASED_OUTPATIENT_CLINIC_OR_DEPARTMENT_OTHER): Payer: Self-pay | Admitting: *Deleted

## 2018-12-28 ENCOUNTER — Emergency Department (HOSPITAL_BASED_OUTPATIENT_CLINIC_OR_DEPARTMENT_OTHER)
Admission: EM | Admit: 2018-12-28 | Discharge: 2018-12-28 | Disposition: A | Discharge status: Home / Self Care | Payer: Medicare Other | Attending: Emergency Medicine | Admitting: Emergency Medicine

## 2018-12-28 DIAGNOSIS — Z85118 Personal history of other malignant neoplasm of bronchus and lung: Secondary | ICD-10-CM | POA: Insufficient documentation

## 2018-12-28 DIAGNOSIS — Z8543 Personal history of malignant neoplasm of ovary: Secondary | ICD-10-CM | POA: Diagnosis not present

## 2018-12-28 DIAGNOSIS — J45909 Unspecified asthma, uncomplicated: Secondary | ICD-10-CM | POA: Diagnosis not present

## 2018-12-28 DIAGNOSIS — R112 Nausea with vomiting, unspecified: Secondary | ICD-10-CM | POA: Insufficient documentation

## 2018-12-28 LAB — URINALYSIS, MICROSCOPIC (REFLEX)

## 2018-12-28 LAB — URINALYSIS, ROUTINE W REFLEX MICROSCOPIC
Bilirubin Urine: NEGATIVE
Glucose, UA: NEGATIVE mg/dL
Ketones, ur: NEGATIVE mg/dL
Leukocytes,Ua: NEGATIVE
Nitrite: NEGATIVE
Protein, ur: 30 mg/dL — AB
Specific Gravity, Urine: 1.015 (ref 1.005–1.030)
pH: 5.5 (ref 5.0–8.0)

## 2018-12-28 LAB — CBC
HCT: 40.8 % (ref 36.0–46.0)
Hemoglobin: 12.7 g/dL (ref 12.0–15.0)
MCH: 29.7 pg (ref 26.0–34.0)
MCHC: 31.1 g/dL (ref 30.0–36.0)
MCV: 95.3 fL (ref 80.0–100.0)
PLATELETS: 260 10*3/uL (ref 150–400)
RBC: 4.28 MIL/uL (ref 3.87–5.11)
RDW: 14.3 % (ref 11.5–15.5)
WBC: 13.1 10*3/uL — ABNORMAL HIGH (ref 4.0–10.5)
nRBC: 0 % (ref 0.0–0.2)

## 2018-12-28 LAB — LIPASE, BLOOD: Lipase: 50 U/L (ref 11–51)

## 2018-12-28 LAB — COMPREHENSIVE METABOLIC PANEL
ALT: 26 U/L (ref 0–44)
AST: 33 U/L (ref 15–41)
Albumin: 4 g/dL (ref 3.5–5.0)
Alkaline Phosphatase: 89 U/L (ref 38–126)
Anion gap: 10 (ref 5–15)
BUN: 34 mg/dL — ABNORMAL HIGH (ref 8–23)
CO2: 14 mmol/L — ABNORMAL LOW (ref 22–32)
Calcium: 8.9 mg/dL (ref 8.9–10.3)
Chloride: 110 mmol/L (ref 98–111)
Creatinine, Ser: 2.48 mg/dL — ABNORMAL HIGH (ref 0.44–1.00)
GFR calc Af Amer: 22 mL/min — ABNORMAL LOW (ref >60)
GFR calc non Af Amer: 19 mL/min — ABNORMAL LOW (ref >60)
Glucose, Bld: 139 mg/dL — ABNORMAL HIGH (ref 70–99)
Potassium: 3.8 mmol/L (ref 3.5–5.1)
Sodium: 134 mmol/L — ABNORMAL LOW (ref 135–145)
Total Bilirubin: 0.8 mg/dL (ref 0.3–1.2)
Total Protein: 7.8 g/dL (ref 6.5–8.1)

## 2018-12-28 LAB — LACTIC ACID, PLASMA: Lactic Acid, Venous: 1.4 mmol/L (ref 0.5–1.9)

## 2018-12-28 MED ORDER — ONDANSETRON HCL 4 MG/2ML IJ SOLN
4.0000 mg | Freq: Once | INTRAMUSCULAR | Status: AC
  Administered 2018-12-28: 4 mg via INTRAVENOUS
  Filled 2018-12-28: qty 2

## 2018-12-28 MED ORDER — SODIUM CHLORIDE 0.9 % IV BOLUS
1000.0000 mL | Freq: Once | INTRAVENOUS | Status: AC
  Administered 2018-12-28: 1000 mL via INTRAVENOUS

## 2018-12-28 NOTE — ED Triage Notes (Addendum)
Pt has history of SBO. Reports vomiting x 4 today. States her colostomy bag is not draining. Reports her BP is "always low". Also concerned for UTI, with dysuria x 2 days

## 2018-12-28 NOTE — Discharge Instructions (Addendum)
You were evaluated in the Emergency Department and after careful evaluation, we did not find any emergent condition requiring admission or further testing in the hospital. ° °Please return to the Emergency Department if you experience any worsening of your condition.  We encourage you to follow up with a primary care provider.  Thank you for allowing us to be a part of your care. °

## 2018-12-28 NOTE — ED Provider Notes (Signed)
[facility] Emergency Department Provider Note MRN:  [id number]  Arrival date & time: 12/28/18     Chief Complaint   Emesis   History of Present Illness   Desiree Adams is a 73 y.o. year-old female with a history of ovarian cancer status post multiple abdominal surgeries and abdominal radiation, status post colostomy placement presenting to the ED with chief complaint of emesis.  Patient began experiencing nausea and vomiting last night, persisting throughout the evening and day today.  Explains that this happens to her multiple times a year and is usually related to a small bowel obstruction.  Explains that she has had minimal output from her colostomy bag for the past day.  Denies fever, no headache or vision change, no chest pain or shortness of breath, had some abdominal distention and discomfort last night, but improved over the past few hours.  Symptoms are constant, no exacerbating relieving factors.  Symptoms are moderate in severity.  Review of Systems  A complete 10 system review of systems was obtained and all systems are negative except as noted in the HPI and PMH.   Patient's Health History    Past Medical History:  Diagnosis Date  . Anxiety   . Asthma    pt denies  . Biliary colic   . Carcinoid tumor, bronchus, lung, malignant ([place])   . Difficult intubation   . GERD (gastroesophageal reflux disease)   . History of Clostridium difficile infection   . Other and unspecified noninfectious gastroenteritis and colitis(558.9) 01/2001   nonspecific colitis most likely from radiation  . Ovarian cancer ([place])   . PONV (postoperative nausea and vomiting)    always nausea and  vomiting  . Renal disorder   . Right lower lobe lung mass   . Small bowel obstruction ([place])   . Stroke ([place])   . Urinary tract infection   . Vitamin B12 deficiency     Past Surgical History:  Procedure Laterality Date  . APPENDECTOMY    . BREAST ENHANCEMENT SURGERY    .  COLONOSCOPY  12/25/2011   Procedure: COLONOSCOPY;  Surgeon: Lafayette Dragon, MD;  Location: [facility] ENDOSCOPY;  Service: Endoscopy;  Laterality: N/A;  . COLOSTOMY  2013  . EXPLORATORY LAPAROTOMY W/ BOWEL RESECTION    . HEMORRHOID SURGERY N/A 06/04/2013   Procedure: single coulum HEMORRHOIDECTOMY;  Surgeon: Odis Hollingshead, MD;  Location: [facility] ORS;  Service: General;  Laterality: N/A;  . LAPAROSCOPIC CHOLECYSTECTOMY    . LUNG REMOVAL, PARTIAL    . rectum removal    . TOTAL ABDOMINAL HYSTERECTOMY W/ BILATERAL SALPINGOOPHORECTOMY    . VIDEO ASSISTED THORACOSCOPY  04/25/12   right    Family History  Problem Relation Age of Onset  . Stroke Mother   . Rheum arthritis Mother   . Hypertension Mother   . Kidney cancer Other        ???? Grandfather  . Bladder Cancer Maternal Grandfather   . Stroke Maternal Grandmother   . Healthy Son   . Colon cancer Neg Hx   . Esophageal cancer Neg Hx   . Rectal cancer Neg Hx   . Stomach cancer Neg Hx     Social History   Socioeconomic History  . Marital status: Divorced    Spouse name: Not on file  . Number of children: Not on file  . Years of education: Not on file  . Highest education level: Not on file  Occupational History  . Occupation: Press photographer  Employer: [employer]  Social Needs  . Financial resource strain: Not on file  . Food insecurity:    Worry: Not on file    Inability: Not on file  . Transportation needs:    Medical: Not on file    Non-medical: Not on file  Tobacco Use  . Smoking status: Never Smoker  . Smokeless tobacco: Never Used  Substance and Sexual Activity  . Alcohol use: Yes    Alcohol/week: 1.0 standard drinks    Types: 1 Glasses of wine per week    Comment: very rarely  . Drug use: No  . Sexual activity: Yes    Birth control/protection: Surgical  Lifestyle  . Physical activity:    Days per week: Not on file    Minutes per session: Not on file  . Stress: Not on file  Relationships  . Social connections:    Talks on phone:  Not on file    Gets together: Not on file    Attends religious service: Not on file    Active member of club or organization: Not on file    Attends meetings of clubs or organizations: Not on file    Relationship status: Not on file  . Intimate partner violence:    Fear of current or ex partner: Not on file    Emotionally abused: Not on file    Physically abused: Not on file    Forced sexual activity: Not on file  Other Topics Concern  . Not on file  Social History Narrative  . Not on file     Physical Exam  Vital Signs and Nursing Notes reviewed Vitals:   12/28/18 1741 12/28/18 2040  BP: (!) 89/60 104/63  Pulse: (!) 113 74  Resp: 20 16  Temp: 97.9 F (36.6 C) 98.3 F (36.8 C)  SpO2: 97% 96%    CONSTITUTIONAL: Well-appearing, NAD, intermittently vomiting NEURO:  Alert and oriented x 3, no focal deficits EYES:  eyes equal and reactive ENT/NECK:  no LAD, no JVD CARDIO: Tachycardic rate, well-perfused, normal S1 and S2 PULM:  CTAB no wheezing or rhonchi GI/GU:  normal bowel sounds, non-distended, non-tender; colostomy bag in place, empty MSK/SPINE:  No gross deformities, no edema SKIN:  no rash, atraumatic PSYCH:  Appropriate speech and behavior  Diagnostic and Interventional Summary    Labs Reviewed  CBC - Abnormal; Notable for the following components:      Result Value   WBC 13.1 (*)    All other components within normal limits  COMPREHENSIVE METABOLIC PANEL - Abnormal; Notable for the following components:   Sodium 134 (*)    CO2 14 (*)    Glucose, Bld 139 (*)    BUN 34 (*)    Creatinine, Ser 2.48 (*)    GFR calc non Af Amer 19 (*)    GFR calc Af Amer 22 (*)    All other components within normal limits  URINALYSIS, ROUTINE W REFLEX MICROSCOPIC - Abnormal; Notable for the following components:   Color, Urine STRAW (*)    APPearance HAZY (*)    Hgb urine dipstick TRACE (*)    Protein, ur 30 (*)    All other components within normal limits  URINALYSIS,  MICROSCOPIC (REFLEX) - Abnormal; Notable for the following components:   Bacteria, UA MANY (*)    All other components within normal limits  LACTIC ACID, PLASMA  LIPASE, BLOOD    CT ABDOMEN PELVIS WO CONTRAST  Final Result  Medications  sodium chloride 0.9 % bolus 1,000 mL (0 mLs Intravenous Stopped 12/28/18 2038)  sodium chloride 0.9 % bolus 1,000 mL (0 mLs Intravenous Stopped 12/28/18 2038)  ondansetron (ZOFRAN) injection 4 mg (4 mg Intravenous Given 12/28/18 1836)     Procedures Critical Care  ED Course and Medical Decision Making  I have reviewed the triage vital signs and the nursing notes.  Pertinent labs & imaging results that were available during my care of the patient were reviewed by me and considered in my medical decision making (see below for details).  Concern for small bowel obstruction in this 73 year old female with history of the same.  Patient is tachycardic with initial blood pressure of 89/60, per chart review she normally has a systolic blood pressure between 100 and 110.  Will provide 2 L crystalloid, obtain CT imaging, labs.  Labs reveal mild acidosis.  CT is without acute emergent concerns.  Patient is feeling much better after fluids and is requesting discharge.  Explains that this happens to her often and she knows how to manage it at home, will take it easy with her diet for the next several days.  After the discussed management above, the patient was determined to be safe for discharge.  The patient was in agreement with this plan and all questions regarding their care were answered.  ED return precautions were discussed and the patient will return to the ED with any significant worsening of condition.  Barth Kirks. Sedonia Small, MD [place] [email or web address]  Final Clinical Impressions(s) / ED Diagnoses     ICD-10-CM   1. Non-intractable vomiting with nausea, unspecified vomiting type R11.2     ED  Discharge Orders    None         Maudie Flakes, MD 12/28/18 2059

## 2019-03-05 ENCOUNTER — Encounter (HOSPITAL_BASED_OUTPATIENT_CLINIC_OR_DEPARTMENT_OTHER): Payer: Self-pay | Admitting: Emergency Medicine

## 2019-03-05 ENCOUNTER — Emergency Department (HOSPITAL_BASED_OUTPATIENT_CLINIC_OR_DEPARTMENT_OTHER)
Admission: EM | Admit: 2019-03-05 | Discharge: 2019-03-05 | Disposition: A | Discharge status: Home / Self Care | Payer: Medicare Other | Attending: Emergency Medicine | Admitting: Emergency Medicine

## 2019-03-05 ENCOUNTER — Other Ambulatory Visit: Payer: Self-pay

## 2019-03-05 DIAGNOSIS — Z85828 Personal history of other malignant neoplasm of skin: Secondary | ICD-10-CM | POA: Diagnosis not present

## 2019-03-05 DIAGNOSIS — R112 Nausea with vomiting, unspecified: Secondary | ICD-10-CM | POA: Insufficient documentation

## 2019-03-05 DIAGNOSIS — Z8673 Personal history of transient ischemic attack (TIA), and cerebral infarction without residual deficits: Secondary | ICD-10-CM | POA: Diagnosis not present

## 2019-03-05 DIAGNOSIS — R109 Unspecified abdominal pain: Secondary | ICD-10-CM | POA: Diagnosis present

## 2019-03-05 DIAGNOSIS — J45909 Unspecified asthma, uncomplicated: Secondary | ICD-10-CM | POA: Insufficient documentation

## 2019-03-05 DIAGNOSIS — Z8543 Personal history of malignant neoplasm of ovary: Secondary | ICD-10-CM | POA: Diagnosis not present

## 2019-03-05 LAB — CBC WITH DIFFERENTIAL/PLATELET
Abs Immature Granulocytes: 0.03 10*3/uL (ref 0.00–0.07)
Basophils Absolute: 0.1 10*3/uL (ref 0.0–0.1)
Basophils Relative: 1 %
Eosinophils Absolute: 0.3 10*3/uL (ref 0.0–0.5)
Eosinophils Relative: 3 %
HCT: 35.1 % — ABNORMAL LOW (ref 36.0–46.0)
Hemoglobin: 11.2 g/dL — ABNORMAL LOW (ref 12.0–15.0)
Immature Granulocytes: 0 %
Lymphocytes Relative: 24 %
Lymphs Abs: 2 10*3/uL (ref 0.7–4.0)
MCH: 30.2 pg (ref 26.0–34.0)
MCHC: 31.9 g/dL (ref 30.0–36.0)
MCV: 94.6 fL (ref 80.0–100.0)
Monocytes Absolute: 0.6 10*3/uL (ref 0.1–1.0)
Monocytes Relative: 7 %
Neutro Abs: 5.5 10*3/uL (ref 1.7–7.7)
Neutrophils Relative %: 65 %
Platelets: 282 10*3/uL (ref 150–400)
RBC: 3.71 MIL/uL — ABNORMAL LOW (ref 3.87–5.11)
RDW: 14.6 % (ref 11.5–15.5)
WBC: 8.5 10*3/uL (ref 4.0–10.5)
nRBC: 0 % (ref 0.0–0.2)

## 2019-03-05 LAB — COMPREHENSIVE METABOLIC PANEL
ALT: 15 U/L (ref 0–44)
AST: 18 U/L (ref 15–41)
Albumin: 3.6 g/dL (ref 3.5–5.0)
Alkaline Phosphatase: 83 U/L (ref 38–126)
Anion gap: 7 (ref 5–15)
BUN: 39 mg/dL — ABNORMAL HIGH (ref 8–23)
CO2: 18 mmol/L — ABNORMAL LOW (ref 22–32)
Calcium: 8.7 mg/dL — ABNORMAL LOW (ref 8.9–10.3)
Chloride: 114 mmol/L — ABNORMAL HIGH (ref 98–111)
Creatinine, Ser: 2.52 mg/dL — ABNORMAL HIGH (ref 0.44–1.00)
GFR calc Af Amer: 21 mL/min — ABNORMAL LOW (ref >60)
GFR calc non Af Amer: 18 mL/min — ABNORMAL LOW (ref >60)
Glucose, Bld: 103 mg/dL — ABNORMAL HIGH (ref 70–99)
Potassium: 3.6 mmol/L (ref 3.5–5.1)
Sodium: 139 mmol/L (ref 135–145)
Total Bilirubin: 0.5 mg/dL (ref 0.3–1.2)
Total Protein: 7.1 g/dL (ref 6.5–8.1)

## 2019-03-05 LAB — LIPASE, BLOOD: Lipase: 57 U/L — ABNORMAL HIGH (ref 11–51)

## 2019-03-05 MED ORDER — ONDANSETRON 4 MG PO TBDP: ORAL_TABLET | ORAL | 0 refills | Status: DC

## 2019-03-05 MED ORDER — FENTANYL CITRATE (PF) 100 MCG/2ML IJ SOLN: 50.0000 ug | Freq: Once | INTRAMUSCULAR | Status: DC

## 2019-03-05 MED ORDER — LACTATED RINGERS IV BOLUS
1000.0000 mL | Freq: Once | INTRAVENOUS | Status: AC
  Administered 2019-03-05: 02:00:00 1000 mL via INTRAVENOUS

## 2019-03-05 MED ORDER — ONDANSETRON HCL 4 MG/2ML IJ SOLN
4.0000 mg | Freq: Once | INTRAMUSCULAR | Status: AC
  Administered 2019-03-05: 4 mg via INTRAVENOUS
  Filled 2019-03-05: qty 2

## 2019-03-05 NOTE — ED Provider Notes (Signed)
Emergency Department Provider Note   I have reviewed the triage vital signs and the nursing notes.   HISTORY  Chief Complaint Abdominal Pain   HPI Desiree Adams is a 73 y.o. female with multiple medical problems documented below but most concerning for a history of cancer status post partial bowel resection with an ostomy in place and multiple bowel obstructions in the past thought to be likely secondary to adhesions.  She is here with symptoms consistent with the same.  Patient states that it started earlier today with decreased output from her ostomy then increased distention abdominal pain and subsequently having nausea vomiting.  Nonbloody nonbilious.  Patient states is exactly how it goes every time.  She states that she usually comes here to get some fluids and some nausea medication feels better and goes home.  Patient states that she has been told she need to be admitted multiple times in the past but she really does not want to be admitted if at all possible.  She had multiple CT scans and work-ups in the past for the same.  No fevers, back pain or urinary symptoms at this time.   No other associated or modifying symptoms.    Past Medical History:  Diagnosis Date  . Anxiety   . Asthma    pt denies  . Biliary colic   . Carcinoid tumor, bronchus, lung, malignant ([place])   . Difficult intubation   . GERD (gastroesophageal reflux disease)   . History of Clostridium difficile infection   . Other and unspecified noninfectious gastroenteritis and colitis(558.9) 01/2001   nonspecific colitis most likely from radiation  . Ovarian cancer ([place])   . PONV (postoperative nausea and vomiting)    always nausea and  vomiting  . Renal disorder   . Right lower lobe lung mass   . Small bowel obstruction ([place])   . Stroke ([place])   . Urinary tract infection   . Vitamin B12 deficiency     Patient Active Problem List   Diagnosis Date Noted  . Sjogren's syndrome ([place]) 01/01/2018  . Stroke  (cerebrum) ([place]) 10/26/2017  . SBO (small bowel obstruction) ([place]) 10/24/2016  . 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 Thrombosed external hemorrhoid-right 10/28/2012  . Right lower lobe lung mass   . 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 Rectal ulcer 12/25/2011  . 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 VITAMIN B12 DEFICIENCY 11/11/2008  . BILIARY COLIC 16/07/9603  . DIARRHEA, CHRONIC 11/11/2008  . NEOPLASM, MALIGNANT, OVARY, HX OF 11/11/2008  . Personal history of other diseases of digestive system 11/11/2008    Past Surgical History:  Procedure Laterality Date  . APPENDECTOMY    . BREAST ENHANCEMENT SURGERY    . COLONOSCOPY  12/25/2011   Procedure: COLONOSCOPY;  Surgeon: Lafayette Dragon, MD;  [facility];  Service: Endoscopy;  Laterality: N/A;  . COLOSTOMY  2013  . EXPLORATORY LAPAROTOMY W/ BOWEL RESECTION    . HEMORRHOID SURGERY N/A 06/04/2013   Procedure: single coulum HEMORRHOIDECTOMY;  Surgeon: Odis Hollingshead, MD;  Location: [facility]S;  Service: General;  Laterality: N/A;  . LAPAROSCOPIC CHOLECYSTECTOMY    . LUNG REMOVAL, PARTIAL    . rectum removal    . TOTAL ABDOMINAL HYSTERECTOMY W/ BILATERAL SALPINGOOPHORECTOMY    . VIDEO ASSISTED THORACOSCOPY  04/25/12   right    Current Outpatient Rx  . Order #: [id number] Class: OTC  . Order #: [id number] Class: Historical Med  . Order #: [id number] Class: Historical Med  .  Order #: [id number] Class: Historical Med  . Order #: [id number] Class: Historical Med  . Order #: [id number] Class: Print  . Order #: [id number] Class: Historical Med    Allergies Other; Sulfamethoxazole; and Sulfa antibiotics  Family History  Problem Relation Age of Onset  . Stroke Mother   . Rheum arthritis Mother   . Hypertension Mother   . Kidney cancer Other        ???? Grandfather  . Bladder Cancer Maternal Grandfather   . Stroke Maternal Grandmother   . Healthy Son   . Colon cancer Neg Hx   . Esophageal cancer Neg Hx   . Rectal cancer Neg Hx   .  Stomach cancer Neg Hx     Social History Social History   Tobacco Use  . Smoking status: Never Smoker  . Smokeless tobacco: Never Used  Substance Use Topics  . Alcohol use: Yes    Alcohol/week: 1.0 standard drinks    Types: 1 Glasses of wine per week    Comment: very rarely  . Drug use: No    Review of Systems  All other systems negative except as documented in the HPI. All pertinent positives and negatives as reviewed in the HPI. ____________________________________________   PHYSICAL EXAM:  VITAL SIGNS: ED Triage Vitals  Enc Vitals Group     BP 03/05/19 0159 133/68     Pulse Rate 03/05/19 0159 74     Resp 03/05/19 0159 20     Temp 03/05/19 0159 98.1 F (36.7 C)     Temp Source 03/05/19 0159 Oral     SpO2 03/05/19 0159 98 %     Weight 03/05/19 0203 119 lb (54 kg)     Height 03/05/19 0203 5\' 4"  (1.626 m)    Constitutional: Alert and oriented. Well appearing and in no acute distress. Eyes: Conjunctivae are normal. PERRL. EOMI. Head: Atraumatic. Nose: No congestion/rhinnorhea. Mouth/Throat: Mucous membranes are moist.  Oropharynx non-erythematous. Neck: No stridor.  No meningeal signs.   Cardiovascular: Normal rate, regular rhythm. Good peripheral circulation. Grossly normal heart sounds.   Respiratory: Normal respiratory effort.  No retractions. Lungs CTAB. Gastrointestinal: ostomy with minimal output. Abdomen diffusely tender and mildly distended per pt report. Musculoskeletal: No lower extremity tenderness nor edema. No gross deformities of extremities. Neurologic:  Normal speech and language. No gross focal neurologic deficits are appreciated.  Skin:  Skin is warm, dry and intact. No rash noted.   ____________________________________________   LABS (all labs ordered are listed, but only abnormal results are displayed)  Labs Reviewed  CBC WITH DIFFERENTIAL/PLATELET - Abnormal; Notable for the following components:      Result Value   RBC 3.71 (*)     Hemoglobin 11.2 (*)    HCT 35.1 (*)    All other components within normal limits  COMPREHENSIVE METABOLIC PANEL - Abnormal; Notable for the following components:   Chloride 114 (*)    CO2 18 (*)    Glucose, Bld 103 (*)    BUN 39 (*)    Creatinine, Ser 2.52 (*)    Calcium 8.7 (*)    GFR calc non Af Amer 18 (*)    GFR calc Af Amer 21 (*)    All other components within normal limits  LIPASE, BLOOD - Abnormal; Notable for the following components:   Lipase 57 (*)    All other components within normal limits   ____________________________________________   INITIAL IMPRESSION / ASSESSMENT AND PLAN / ED COURSE  Overall patient appears well.  She  very well could have a bowel obstruction however patient states that she will not stay in the hospital unless she feels horrible does not get better or has significant lab abnormalities and thus we will forego doing a CT scan at this time.  Low suspicion for colitis, diverticulitis or other symptoms as it so consistent with her previous history.  If labs are significantly abnormal, vomiting does not improve, pain gets worse or other symptoms then will consider doing CT scan at this time.  She already has follow-up with her surgeon this month.  Feeling better. Tolerating PO. Mild lipemia, doubt pancreatitis. Stable for dc with supportive care at home. Once again, without a desire for admission or rugery if needed, there is no indication for imaging so will forego same.   Pertinent labs & imaging results that were available during my care of the patient were reviewed by me and considered in my medical decision making (see chart for details).  A medical screening exam was performed and I feel the patient has had an appropriate workup for their chief complaint at this time and likelihood of emergent condition existing is low. They have been counseled on decision, discharge, follow up and which symptoms necessitate immediate return to the emergency  department. They or their family verbally stated understanding and agreement with plan and discharged in stable condition.   ____________________________________________  FINAL CLINICAL IMPRESSION(S) / ED DIAGNOSES  Final diagnoses:  Non-intractable vomiting with nausea, unspecified vomiting type     MEDICATIONS GIVEN DURING THIS VISIT:  Medications  fentaNYL (SUBLIMAZE) injection 50 mcg (50 mcg Intravenous Refused 03/05/19 0322)  lactated ringers bolus 1,000 mL (0 mLs Intravenous Stopped 03/05/19 0322)  ondansetron (ZOFRAN) injection 4 mg (4 mg Intravenous Given 03/05/19 0229)     NEW OUTPATIENT MEDICATIONS STARTED DURING THIS VISIT:  Discharge Medication List as of 03/05/2019  3:31 AM    START taking these medications   Details  ondansetron (ZOFRAN ODT) 4 MG disintegrating tablet 4mg  ODT q4 hours prn nausea/vomit, Print        Note:  This note was prepared with assistance of Dragon voice recognition software. Occasional wrong-word or sound-a-like substitutions may have occurred due to the inherent limitations of voice recognition software.   Aicia Babinski, Corene Cornea, MD 03/05/19 0630

## 2019-03-05 NOTE — ED Notes (Signed)
Refused IV pain med, but request made for 'some to take" when she gets home. ED MD informed, nausea relieved

## 2019-03-05 NOTE — ED Notes (Signed)
ED Provider at bedside. 

## 2019-03-05 NOTE — ED Triage Notes (Signed)
Pt states she has a bowel obstruction  Pt states she has had many  Pt states she feels bloated  Pt has nausea without vomiting  Pt states she has a colostomy that has drained very little

## 2019-12-25 ENCOUNTER — Telehealth: Payer: Self-pay | Admitting: Gastroenterology

## 2019-12-25 NOTE — Telephone Encounter (Signed)
Returned pt call. I informed her that all FMLA paperwork is handled by [employer]. Information for [employer] given to pt. Phone# [phone] given to pt as well to call with additional questions about the timing and processing of forms.

## 2020-01-06 ENCOUNTER — Ambulatory Visit: Payer: Medicare HMO | Admitting: Family Medicine

## 2020-01-06 ENCOUNTER — Encounter: Payer: Self-pay | Admitting: Family Medicine

## 2020-01-06 ENCOUNTER — Other Ambulatory Visit: Payer: Self-pay

## 2020-01-06 VITALS — BP 118/64 | HR 73 | Temp 97.4°F | Ht 64.0 in | Wt 114.6 lb

## 2020-01-06 DIAGNOSIS — K56609 Unspecified intestinal obstruction, unspecified as to partial versus complete obstruction: Secondary | ICD-10-CM

## 2020-01-06 DIAGNOSIS — K520 Gastroenteritis and colitis due to radiation: Secondary | ICD-10-CM | POA: Diagnosis not present

## 2020-01-06 NOTE — Progress Notes (Signed)
New Patient Office Visit  Subjective:  Patient ID: Desiree Adams, female    DOB: [birth year]  Age: 74 y.o. MRN: [id number]  CC:  Chief Complaint  Patient presents with  . Establish Care    New patient,     HPI Desiree Adams presents for establishment of care.  History of ovarian cancer that had been treated with radiation therapy.  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  She now has a colostomy.  She has developed spontaneous bowel obstructions over the years that have responded to IV fluids, she relates.  She has had thousands of these through her life.  At times she has become so weak that she cannot leave the bed.  At other times she has developed violent vomiting associated with urinary incontinence.  She has actually laid in her urine without having to strength to move.  She hs seen a gastroenterologist recently but tells me that she was unable to follow-up due to the pandemic.  Past Medical History:  Diagnosis Date  . Anxiety   . Asthma    pt denies  . Biliary colic   . Carcinoid tumor, bronchus, lung, malignant ([place])   . Difficult intubation   . GERD (gastroesophageal reflux disease)   . History of Clostridium difficile infection   . Other and unspecified noninfectious gastroenteritis and colitis(558.9) 01/2001   nonspecific colitis most likely from radiation  . Ovarian cancer ([place])   . PONV (postoperative nausea and vomiting)    always nausea and  vomiting  . Renal disorder   . Right lower lobe lung mass   . Small bowel obstruction ([place])   . Stroke ([place])   . Urinary tract infection   . Vitamin B12 deficiency     Past Surgical History:  Procedure Laterality Date  . APPENDECTOMY    . BREAST ENHANCEMENT SURGERY    . COLONOSCOPY  12/25/2011   Procedure: COLONOSCOPY;  Surgeon: Lafayette Dragon, MD;  Location: [facility];  Service: Endoscopy;  Laterality: N/A;  . COLOSTOMY   2013  . EXPLORATORY LAPAROTOMY W/ BOWEL RESECTION    . HEMORRHOID SURGERY N/A 06/04/2013   Procedure: single coulum HEMORRHOIDECTOMY;  Surgeon: Odis Hollingshead, MD;  Location: [facility] ORS;  Service: General;  Laterality: N/A;  . LAPAROSCOPIC CHOLECYSTECTOMY    . LUNG REMOVAL, PARTIAL    . rectum removal    . TOTAL ABDOMINAL HYSTERECTOMY W/ BILATERAL SALPINGOOPHORECTOMY    . VIDEO ASSISTED THORACOSCOPY  04/25/12   right    Family History  Problem Relation Age of Onset  . Stroke Mother   . Rheum arthritis Mother   . Hypertension Mother   . Kidney cancer Other        ???? Grandfather  . Bladder Cancer Maternal Grandfather   . Stroke Maternal Grandmother   . Healthy Son   . Colon cancer Neg Hx   . Esophageal cancer Neg Hx   . Rectal cancer Neg Hx   . Stomach cancer Neg Hx     Social History   Socioeconomic History  . Marital status: Divorced    Spouse name: Not on file  . Number of children: Not on file  . Years of education: Not on file  . Highest education level: Not on file  Occupational History  . Occupation: Scientist, clinical (histocompatibility and immunogenetics): [employer]  Tobacco Use  . Smoking status: Never  Smoker  . Smokeless tobacco: Never Used  Substance and Sexual Activity  . Alcohol use: Yes    Alcohol/week: 1.0 standard drinks    Types: 1 Glasses of wine per week    Comment: very rarely  . Drug use: No  . Sexual activity: Yes    Birth control/protection: Surgical  Other Topics Concern  . Not on file  Social History Narrative  . Not on file   Social Determinants of Health   Financial Resource Strain:   . Difficulty of Paying Living Expenses:   Food Insecurity:   . Worried About Charity fundraiser in the Last Year:   . Arboriculturist in the Last Year:   Transportation Needs:   . Film/video editor (Medical):   Marland Kitchen Lack of Transportation (Non-Medical):   Physical Activity:   . Days of Exercise per Week:   . Minutes of Exercise per Session:   Stress:   . Feeling of Stress :   Social  Connections:   . Frequency of Communication with Friends and Family:   . Frequency of Social Gatherings with Friends and Family:   . Attends Religious Services:   . Active Member of Clubs or Organizations:   . Attends Archivist Meetings:   Marland Kitchen Marital Status:   Intimate Partner Violence:   . Fear of Current or Ex-Partner:   . Emotionally Abused:   Marland Kitchen Physically Abused:   . Sexually Abused:     ROS Review of Systems  Constitutional: Negative.   Respiratory: Negative.   Cardiovascular: Negative.   Gastrointestinal: Positive for abdominal pain, nausea and vomiting.  Genitourinary: Negative.   Psychiatric/Behavioral: Positive for agitation and behavioral problems.    Objective:   Today's Vitals: BP 118/64   Pulse 73   Temp (!) 97.4 F (36.3 C) (Tympanic)   Ht 5\' 4"  (1.626 m)   Wt 114 lb 9.6 oz (52 kg)   SpO2 97%   BMI 19.67 kg/m   Physical Exam Vitals and nursing note reviewed.  Constitutional:      General: She is not in acute distress.    Appearance: Normal appearance. She is normal weight. She is not ill-appearing, toxic-appearing or diaphoretic.  HENT:     Head: Normocephalic and atraumatic.     Right Ear: External ear normal.     Left Ear: External ear normal.  Eyes:     General: No scleral icterus.       Right eye: No discharge.        Left eye: No discharge.     Conjunctiva/sclera: Conjunctivae normal.  Pulmonary:     Effort: Pulmonary effort is normal.  Neurological:     Mental Status: She is alert and oriented to person, place, and time.  Psychiatric:        Behavior: Behavior is agitated.     Assessment & Plan:   Problem List Items Addressed This Visit      Digestive   Radiation colitis - Primary   SBO (small bowel obstruction) ([place])      Outpatient Encounter Medications as of 01/06/2020  Medication Sig  . aspirin EC 325 MG EC tablet Take 1 tablet (325 mg total) by mouth daily.  . busPIRone (BUSPAR) 5 MG tablet Take 5 mg by mouth  daily.  Marland Kitchen gabapentin (NEURONTIN) 100 MG capsule   . Multiple Vitamin (MULTI-VITAMIN PO) Take 5 mLs by mouth See admin instructions. Mix 5 mls multivitamin vegetable powder in water and drink  .  omeprazole (PRILOSEC) 20 MG capsule Take 20 mg by mouth at bedtime.  . ondansetron (ZOFRAN ODT) 4 MG disintegrating tablet 4mg  ODT q4 hours prn nausea/vomit  . polyvinyl alcohol (ARTIFICIAL TEARS) 1.4 % ophthalmic solution Place 1 drop into both eyes at bedtime.   No facility-administered encounter medications on file as of 01/06/2020.    Follow-up: No follow-ups on file.   I listened to this patient tell her story and express her frustrations.  I realize that this is caused her a great deal of aggravation and likely through the years.  However, I told her that I would not be comfortable managing her small bowel obstructions at home with IV fluids on a as needed basis.  I believe that she needs to be in a clinic where she can be observed throughout these episodes.  The patient then became angry and upset and threw her paperwork down and left.  Libby Maw, MD

## 2020-03-24 ENCOUNTER — Telehealth: Payer: Self-pay | Admitting: Family Medicine

## 2020-03-24 NOTE — Telephone Encounter (Signed)
Spoke with patient she stated that Dr Ethelene Hal would not take her for primary care.  She is seeing another provider

## 2021-06-27 ENCOUNTER — Telehealth: Payer: Self-pay | Admitting: Gastroenterology

## 2021-06-27 NOTE — Telephone Encounter (Signed)
Inbound call from patient. States she need an urgent colonoscopy procedure due to her health condition and possible adenomas. I tried to give her pre visit but patient states she did not want a pre visit appointment just the colon. Tried explaining to patient that a pre visit appt is needed before procedure as protocol. Patient was not trying to understand and insisted to speak with a nurse about scheduling colonoscopy.

## 2021-06-27 NOTE — Telephone Encounter (Signed)
Reviewed her chart. There are new findings in the colon on the PET scan. She has been urged to follow up with her established GI. She is a difficult intubation. She has a colostomy. Do you want to see her in the office first? I can possibly get her in this week.

## 2021-06-27 NOTE — Telephone Encounter (Addendum)
Called the patient. No answer. Left her a voicemail to call me back.

## 2021-06-27 NOTE — Telephone Encounter (Signed)
Given its time sensitive we should try to schedule colonoscopy at hospital with any available provider. She has h/o melanoma s/p multiple bowel resection with new FDG avid focal lesions in ascending and transverse colon. Please check when we can schedule for procedure at the hospital and I will discuss with provider if its someone other than myself. Thanks

## 2021-06-28 NOTE — Telephone Encounter (Signed)
Patient calls back. She is advised of the plan. Reports she has not heard back from her oncology surgeon as of yet. She expresses her anger and frustration at "being labeled as hard to anesthetize" and "I know the doctors just want to charge me more so they are doing it in the hospital." Patient explained to me she was told she has a "small mouth" as explained to her by anesthesiology. Again states her frustrations, that she will call the other doctor and hangs up. There are no hospital days on any schedule before November.

## 2021-06-28 NOTE — Telephone Encounter (Signed)
We will do our best to schedule the procedure as soon as possible but has to be done at the hospital for safety reasons. Given she already has procedure tentatively scheduled for next week through her oncologist office, it is best to proceed with that to avoid any potential delay in care

## 2021-06-28 NOTE — Telephone Encounter (Signed)
Patient called back. She said the oncologist at [facility] can get her colonoscopy sooner for her. I am stopping my search for an appointment at her request.

## 2021-07-27 ENCOUNTER — Emergency Department (HOSPITAL_BASED_OUTPATIENT_CLINIC_OR_DEPARTMENT_OTHER): Payer: Medicare HMO

## 2021-07-27 ENCOUNTER — Inpatient Hospital Stay (HOSPITAL_COMMUNITY): Payer: Medicare HMO

## 2021-07-27 ENCOUNTER — Other Ambulatory Visit: Payer: Self-pay

## 2021-07-27 ENCOUNTER — Encounter (HOSPITAL_BASED_OUTPATIENT_CLINIC_OR_DEPARTMENT_OTHER): Payer: Self-pay | Admitting: Emergency Medicine

## 2021-07-27 ENCOUNTER — Inpatient Hospital Stay (HOSPITAL_BASED_OUTPATIENT_CLINIC_OR_DEPARTMENT_OTHER)
Admission: EM | Admit: 2021-07-27 | Discharge: 2021-07-31 | DRG: 871 | Disposition: A | Discharge status: Home / Self Care | Payer: Medicare HMO | Attending: Family Medicine | Admitting: Family Medicine

## 2021-07-27 DIAGNOSIS — K5730 Diverticulosis of large intestine without perforation or abscess without bleeding: Secondary | ICD-10-CM | POA: Diagnosis present

## 2021-07-27 DIAGNOSIS — Z8051 Family history of malignant neoplasm of kidney: Secondary | ICD-10-CM

## 2021-07-27 DIAGNOSIS — Z20822 Contact with and (suspected) exposure to covid-19: Secondary | ICD-10-CM | POA: Diagnosis present

## 2021-07-27 DIAGNOSIS — Z933 Colostomy status: Secondary | ICD-10-CM

## 2021-07-27 DIAGNOSIS — E8721 Acute metabolic acidosis: Secondary | ICD-10-CM | POA: Diagnosis present

## 2021-07-27 DIAGNOSIS — N189 Chronic kidney disease, unspecified: Secondary | ICD-10-CM | POA: Diagnosis not present

## 2021-07-27 DIAGNOSIS — E43 Unspecified severe protein-calorie malnutrition: Secondary | ICD-10-CM | POA: Diagnosis present

## 2021-07-27 DIAGNOSIS — Z8673 Personal history of transient ischemic attack (TIA), and cerebral infarction without residual deficits: Secondary | ICD-10-CM

## 2021-07-27 DIAGNOSIS — N170 Acute kidney failure with tubular necrosis: Secondary | ICD-10-CM | POA: Diagnosis present

## 2021-07-27 DIAGNOSIS — N179 Acute kidney failure, unspecified: Secondary | ICD-10-CM | POA: Diagnosis not present

## 2021-07-27 DIAGNOSIS — G4700 Insomnia, unspecified: Secondary | ICD-10-CM | POA: Diagnosis not present

## 2021-07-27 DIAGNOSIS — A419 Sepsis, unspecified organism: Principal | ICD-10-CM | POA: Diagnosis present

## 2021-07-27 DIAGNOSIS — Z8052 Family history of malignant neoplasm of bladder: Secondary | ICD-10-CM | POA: Diagnosis not present

## 2021-07-27 DIAGNOSIS — E871 Hypo-osmolality and hyponatremia: Secondary | ICD-10-CM | POA: Diagnosis not present

## 2021-07-27 DIAGNOSIS — N390 Urinary tract infection, site not specified: Secondary | ICD-10-CM

## 2021-07-27 DIAGNOSIS — I129 Hypertensive chronic kidney disease with stage 1 through stage 4 chronic kidney disease, or unspecified chronic kidney disease: Secondary | ICD-10-CM | POA: Diagnosis present

## 2021-07-27 DIAGNOSIS — R6520 Severe sepsis without septic shock: Secondary | ICD-10-CM | POA: Diagnosis present

## 2021-07-27 DIAGNOSIS — R0602 Shortness of breath: Secondary | ICD-10-CM | POA: Diagnosis present

## 2021-07-27 DIAGNOSIS — Z681 Body mass index (BMI) 19 or less, adult: Secondary | ICD-10-CM

## 2021-07-27 DIAGNOSIS — Z87440 Personal history of urinary (tract) infections: Secondary | ICD-10-CM

## 2021-07-27 DIAGNOSIS — Z7982 Long term (current) use of aspirin: Secondary | ICD-10-CM

## 2021-07-27 DIAGNOSIS — Z8249 Family history of ischemic heart disease and other diseases of the circulatory system: Secondary | ICD-10-CM

## 2021-07-27 DIAGNOSIS — T368X5A Adverse effect of other systemic antibiotics, initial encounter: Secondary | ICD-10-CM | POA: Diagnosis present

## 2021-07-27 DIAGNOSIS — E874 Mixed disorder of acid-base balance: Secondary | ICD-10-CM | POA: Diagnosis not present

## 2021-07-27 DIAGNOSIS — Z79899 Other long term (current) drug therapy: Secondary | ICD-10-CM

## 2021-07-27 DIAGNOSIS — N184 Chronic kidney disease, stage 4 (severe): Secondary | ICD-10-CM

## 2021-07-27 DIAGNOSIS — Z823 Family history of stroke: Secondary | ICD-10-CM

## 2021-07-27 DIAGNOSIS — Z8261 Family history of arthritis: Secondary | ICD-10-CM

## 2021-07-27 DIAGNOSIS — N136 Pyonephrosis: Secondary | ICD-10-CM | POA: Diagnosis present

## 2021-07-27 DIAGNOSIS — N19 Unspecified kidney failure: Secondary | ICD-10-CM

## 2021-07-27 DIAGNOSIS — Z902 Acquired absence of lung [part of]: Secondary | ICD-10-CM

## 2021-07-27 DIAGNOSIS — N320 Bladder-neck obstruction: Secondary | ICD-10-CM

## 2021-07-27 DIAGNOSIS — Z85820 Personal history of malignant melanoma of skin: Secondary | ICD-10-CM

## 2021-07-27 DIAGNOSIS — D631 Anemia in chronic kidney disease: Secondary | ICD-10-CM | POA: Diagnosis present

## 2021-07-27 DIAGNOSIS — K219 Gastro-esophageal reflux disease without esophagitis: Secondary | ICD-10-CM | POA: Diagnosis present

## 2021-07-27 DIAGNOSIS — N1330 Unspecified hydronephrosis: Secondary | ICD-10-CM | POA: Diagnosis not present

## 2021-07-27 DIAGNOSIS — Z8543 Personal history of malignant neoplasm of ovary: Secondary | ICD-10-CM

## 2021-07-27 DIAGNOSIS — E876 Hypokalemia: Secondary | ICD-10-CM | POA: Diagnosis present

## 2021-07-27 LAB — COMPREHENSIVE METABOLIC PANEL
ALT: 13 U/L (ref 0–44)
AST: 14 U/L — ABNORMAL LOW (ref 15–41)
Albumin: 3.2 g/dL — ABNORMAL LOW (ref 3.5–5.0)
Alkaline Phosphatase: 91 U/L (ref 38–126)
BUN: 86 mg/dL — ABNORMAL HIGH (ref 8–23)
CO2: <7 mmol/L — ABNORMAL LOW (ref 22–32)
Calcium: 7.2 mg/dL — ABNORMAL LOW (ref 8.9–10.3)
Chloride: 116 mmol/L — ABNORMAL HIGH (ref 98–111)
Creatinine, Ser: 8.64 mg/dL — ABNORMAL HIGH (ref 0.44–1.00)
GFR, Estimated: 4 mL/min — ABNORMAL LOW (ref >60)
Glucose, Bld: 126 mg/dL — ABNORMAL HIGH (ref 70–99)
Potassium: 4 mmol/L (ref 3.5–5.1)
Sodium: 134 mmol/L — ABNORMAL LOW (ref 135–145)
Total Bilirubin: 0.8 mg/dL (ref 0.3–1.2)
Total Protein: 6.6 g/dL (ref 6.5–8.1)

## 2021-07-27 LAB — RESP PANEL BY RT-PCR (FLU A&B, COVID) ARPGX2
Influenza A by PCR: NEGATIVE
Influenza B by PCR: NEGATIVE
SARS Coronavirus 2 by RT PCR: NEGATIVE

## 2021-07-27 LAB — CBC WITH DIFFERENTIAL/PLATELET
Abs Immature Granulocytes: 0.17 10*3/uL — ABNORMAL HIGH (ref 0.00–0.07)
Basophils Absolute: 0.1 10*3/uL (ref 0.0–0.1)
Basophils Relative: 1 %
Eosinophils Absolute: 0 10*3/uL (ref 0.0–0.5)
Eosinophils Relative: 0 %
HCT: 28.6 % — ABNORMAL LOW (ref 36.0–46.0)
Hemoglobin: 9.6 g/dL — ABNORMAL LOW (ref 12.0–15.0)
Immature Granulocytes: 2 %
Lymphocytes Relative: 9 %
Lymphs Abs: 0.9 10*3/uL (ref 0.7–4.0)
MCH: 33.1 pg (ref 26.0–34.0)
MCHC: 33.6 g/dL (ref 30.0–36.0)
MCV: 98.6 fL (ref 80.0–100.0)
Monocytes Absolute: 0.6 10*3/uL (ref 0.1–1.0)
Monocytes Relative: 6 %
Neutro Abs: 8.8 10*3/uL — ABNORMAL HIGH (ref 1.7–7.7)
Neutrophils Relative %: 82 %
Platelets: 381 10*3/uL (ref 150–400)
RBC: 2.9 MIL/uL — ABNORMAL LOW (ref 3.87–5.11)
RDW: 14.8 % (ref 11.5–15.5)
WBC: 10.6 10*3/uL — ABNORMAL HIGH (ref 4.0–10.5)
nRBC: 0.9 % — ABNORMAL HIGH (ref 0.0–0.2)

## 2021-07-27 LAB — I-STAT ARTERIAL BLOOD GAS, ED
Calcium, Ion: 1.15 mmol/L (ref 1.15–1.40)
HCT: 30 % — ABNORMAL LOW (ref 36.0–46.0)
Hemoglobin: 10.2 g/dL — ABNORMAL LOW (ref 12.0–15.0)
Potassium: 4.1 mmol/L (ref 3.5–5.1)
Sodium: 137 mmol/L (ref 135–145)
pCO2 arterial: <15 mmHg — CL (ref 32.0–48.0)
pH, Arterial: 6.992 — CL (ref 7.350–7.450)
pO2, Arterial: 137 mmHg — ABNORMAL HIGH (ref 83.0–108.0)

## 2021-07-27 LAB — MRSA NEXT GEN BY PCR, NASAL: MRSA by PCR Next Gen: NOT DETECTED

## 2021-07-27 LAB — BASIC METABOLIC PANEL
Anion gap: 12 (ref 5–15)
BUN: 83 mg/dL — ABNORMAL HIGH (ref 8–23)
CO2: 11 mmol/L — ABNORMAL LOW (ref 22–32)
Calcium: 6.5 mg/dL — ABNORMAL LOW (ref 8.9–10.3)
Chloride: 117 mmol/L — ABNORMAL HIGH (ref 98–111)
Creatinine, Ser: 7.89 mg/dL — ABNORMAL HIGH (ref 0.44–1.00)
GFR, Estimated: 5 mL/min — ABNORMAL LOW (ref >60)
Glucose, Bld: 134 mg/dL — ABNORMAL HIGH (ref 70–99)
Potassium: 2.8 mmol/L — ABNORMAL LOW (ref 3.5–5.1)
Sodium: 140 mmol/L (ref 135–145)

## 2021-07-27 LAB — PROTIME-INR
INR: 1.2 (ref 0.8–1.2)
Prothrombin Time: 14.7 seconds (ref 11.4–15.2)

## 2021-07-27 LAB — CK: Total CK: 229 U/L (ref 38–234)

## 2021-07-27 LAB — LACTIC ACID, PLASMA: Lactic Acid, Venous: 0.7 mmol/L (ref 0.5–1.9)

## 2021-07-27 LAB — PROCALCITONIN: Procalcitonin: 0.24 ng/mL

## 2021-07-27 LAB — GLUCOSE, CAPILLARY
Glucose-Capillary: 142 mg/dL — ABNORMAL HIGH (ref 70–99)
Glucose-Capillary: 124 mg/dL — ABNORMAL HIGH (ref 70–99)

## 2021-07-27 LAB — APTT: aPTT: 29 seconds (ref 24–36)

## 2021-07-27 LAB — BRAIN NATRIURETIC PEPTIDE: B Natriuretic Peptide: 360 pg/mL — ABNORMAL HIGH (ref 0.0–100.0)

## 2021-07-27 MED ORDER — SODIUM BICARBONATE 8.4 % IV SOLN
INTRAVENOUS | Status: AC
  Administered 2021-07-27: 50 meq via INTRAVENOUS
  Filled 2021-07-27: qty 150

## 2021-07-27 MED ORDER — SODIUM CHLORIDE 0.9 % IV SOLN
1.0000 g | INTRAVENOUS | Status: DC
  Filled 2021-07-27: qty 1

## 2021-07-27 MED ORDER — SODIUM BICARBONATE 8.4 % IV SOLN
50.0000 meq | INTRAVENOUS | Status: DC
Start: 2021-07-27 — End: 2021-07-27
  Filled 2021-07-27: qty 50

## 2021-07-27 MED ORDER — SODIUM BICARBONATE 8.4 % IV SOLN
INTRAVENOUS | Status: DC
  Filled 2021-07-27: qty 1000

## 2021-07-27 MED ORDER — STERILE WATER FOR INJECTION IV SOLN: INTRAVENOUS | Status: DC

## 2021-07-27 MED ORDER — DOCUSATE SODIUM 100 MG PO CAPS: 100.0000 mg | ORAL_CAPSULE | Freq: Two times a day (BID) | ORAL | Status: DC | PRN

## 2021-07-27 MED ORDER — CHLORHEXIDINE GLUCONATE CLOTH 2 % EX PADS
6.0000 | MEDICATED_PAD | Freq: Every day | CUTANEOUS | Status: DC
  Administered 2021-07-28 – 2021-07-31 (×4): 6 via TOPICAL

## 2021-07-27 MED ORDER — SODIUM CHLORIDE 0.9 % IV BOLUS
1000.0000 mL | Freq: Once | INTRAVENOUS | Status: AC
  Administered 2021-07-27: 1000 mL via INTRAVENOUS

## 2021-07-27 MED ORDER — WHITE PETROLATUM EX OINT: TOPICAL_OINTMENT | CUTANEOUS | Status: DC | PRN

## 2021-07-27 MED ORDER — LACTATED RINGERS IV BOLUS
1000.0000 mL | Freq: Once | INTRAVENOUS | Status: AC
  Administered 2021-07-27: 1000 mL via INTRAVENOUS

## 2021-07-27 MED ORDER — HEPARIN SODIUM (PORCINE) 5000 UNIT/ML IJ SOLN
5000.0000 [IU] | Freq: Three times a day (TID) | INTRAMUSCULAR | Status: DC
  Administered 2021-07-27 – 2021-07-31 (×8): 5000 [IU] via SUBCUTANEOUS
  Filled 2021-07-27 (×11): qty 1

## 2021-07-27 MED ORDER — SODIUM BICARBONATE 8.4 % IV SOLN
50.0000 meq | INTRAVENOUS | Status: AC
  Administered 2021-07-27 (×2): 50 meq via INTRAVENOUS
  Filled 2021-07-27: qty 50

## 2021-07-27 MED ORDER — SODIUM BICARBONATE 8.4 % IV SOLN
INTRAVENOUS | Status: DC
  Filled 2021-07-27 (×4): qty 1000

## 2021-07-27 MED ORDER — SODIUM CHLORIDE 0.9 % IV SOLN
2.0000 g | Freq: Once | INTRAVENOUS | Status: AC
  Administered 2021-07-27: 2 g via INTRAVENOUS
  Filled 2021-07-27: qty 2

## 2021-07-27 MED ORDER — POLYETHYLENE GLYCOL 3350 17 G PO PACK: 17.0000 g | PACK | Freq: Every day | ORAL | Status: DC | PRN

## 2021-07-27 MED ORDER — SODIUM CHLORIDE 0.9 % IV SOLN: INTRAVENOUS | Status: DC | PRN

## 2021-07-27 MED ORDER — WHITE PETROLATUM EX OINT
TOPICAL_OINTMENT | CUTANEOUS | Status: AC
  Filled 2021-07-27: qty 28.35

## 2021-07-27 MED ORDER — POTASSIUM CHLORIDE CRYS ER 20 MEQ PO TBCR
20.0000 meq | EXTENDED_RELEASE_TABLET | Freq: Once | ORAL | Status: AC
  Administered 2021-07-27: 20 meq via ORAL
  Filled 2021-07-27: qty 1

## 2021-07-27 MED ORDER — ACETAMINOPHEN 325 MG PO TABS
650.0000 mg | ORAL_TABLET | Freq: Four times a day (QID) | ORAL | Status: DC | PRN
  Administered 2021-07-27 – 2021-07-31 (×3): 650 mg via ORAL
  Filled 2021-07-27 (×3): qty 2

## 2021-07-27 MED ORDER — ONDANSETRON HCL 4 MG/2ML IJ SOLN
4.0000 mg | Freq: Once | INTRAMUSCULAR | Status: AC
  Administered 2021-07-27: 4 mg via INTRAVENOUS
  Filled 2021-07-27: qty 2

## 2021-07-27 NOTE — Progress Notes (Signed)
Pharmacy Antibiotic Note  Desiree Adams is a 75 y.o. female admitted on 07/27/2021 with suspected intra-abdominal infection.  Pharmacy has been consulted for cefepime dosing.  Presented with SOB, dizziness, blurry vision, and rapid respirations. Afebrile, WBC 10.6, SCr 8.64 (BL ~2.5, not on HD).   Plan: Cefepime 2 g IV LD (only dose available at [facility]) Followed by cefepime 1 g q24 hours Monitor renal function, other labs, and clinical improvement  Height: 5\' 4"  (162.6 cm) Weight: 46.7 kg (103 lb) IBW/kg (Calculated) : 54.7  Temp (24hrs), Avg:97.4 F (36.3 C), Min:97.4 F (36.3 C), Max:97.4 F (36.3 C)  Recent Labs  Lab 07/27/21 1053  WBC 10.6*  CREATININE 8.64*    Estimated Creatinine Clearance: 4.2 mL/min (A) (by C-G formula based on SCr of 8.64 mg/dL (H)).    Allergies  Allergen Reactions   Other Hives    Reaction to hospital sheets ( pt is allergic to TIDE and WISK)    Antimicrobials this admission: Cefepime 10/26>>  Microbiology results: 10/26 BCx: in process  Thank you for allowing pharmacy to be a part of this patient's care.  Donald Pore 07/27/2021 12:48 PM

## 2021-07-27 NOTE — ED Notes (Signed)
Pt c/o throbbing pain to BLE after sodim bicarb administered; EDP made aware

## 2021-07-27 NOTE — Consult Note (Signed)
[facility] Renal Consultation Note  Requesting MD: Tamala Julian Indication for Consultation: A on CRF  HPI:  Desiree Adams is a 75 y.o. 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 Venia Minks-  elevated post void residual and urinary incontinence, CKD followed in [facility]- Dr. Willene Hatchet-   last crt that I see is from Feb of 2022 in the low 2's.  She presented to the ER today with complaint of SOB- dry cough, dec oral intake, dizziness and blurred vision that it turns out all of these sxms occurred after she was placed on cipro and naproxen for a wrist/hand issue.  In the ER this AM-   BP was low-  crt was over 8 and bicarb less than 7.  Got a bolus of NS, then LR-  also amp of bicarb-  feeling a little better-  now on iv bicarb at 150 per hour.  Says UOP was down to nothing and is also incont of urine. CT did show mild bilat hydro-  unknown what imaging usually shows  Creat  Date/Time Value Ref Range Status  03/22/2012 03:45 PM 1.42 (H) 0.50 - 1.10 mg/dL Final   Creatinine, Ser  Date/Time Value Ref Range Status  07/27/2021 10:53 AM 8.64 (H) 0.44 - 1.00 mg/dL Final  03/05/2019 02:17 AM 2.52 (H) 0.44 - 1.00 mg/dL Final  12/28/2018 06:28 PM 2.48 (H) 0.44 - 1.00 mg/dL Final  06/05/2018 10:04 PM 2.34 (H) 0.44 - 1.00 mg/dL Final  06/05/2018 05:35 AM 2.10 (H) 0.44 - 1.00 mg/dL Final  04/19/2018 03:18 PM 2.45 (H) 0.40 - 1.20 mg/dL Final  02/17/2018 08:27 PM 2.33 (H) 0.44 - 1.00 mg/dL Final  02/17/2018 01:27 AM 2.46 (H) 0.44 - 1.00 mg/dL Final  10/28/2017 03:38 AM 1.75 (H) 0.44 - 1.00 mg/dL Final  10/26/2017 02:31 PM 2.16 (H) 0.44 - 1.00 mg/dL Final  02/16/2017 01:50 AM 1.91 (H) 0.44 - 1.00 mg/dL Final  12/24/2016 08:28 AM 1.73 (H) 0.44 - 1.00  mg/dL Final  10/24/2016 08:00 PM 2.08 (H) 0.44 - 1.00 mg/dL Final  05/23/2016 02:20 AM 2.30 (H) 0.44 - 1.00 mg/dL Final  09/07/2015 06:00 PM 2.80 (H) 0.44 - 1.00 mg/dL Final  11/07/2013 04:20 PM 2.20 (H) 0.50 - 1.10 mg/dL Final  05/29/2013 02:15 PM 1.99 (H) 0.50 - 1.10 mg/dL Final  04/08/2013 02:31 PM 1.9 (H) 0.4 - 1.2 mg/dL Final  06/27/2012 05:51 PM 1.11 (H) 0.50 - 1.10 mg/dL Final  06/24/2012 11:55 AM 1.14 (H) 0.50 - 1.10 mg/dL Final  04/27/2012 04:51 AM 1.00 0.50 - 1.10 mg/dL Final  04/26/2012 04:20 AM 0.92 0.50 - 1.10 mg/dL Final    Comment:    DELTA CHECK NOTED  04/24/2012 01:46 PM 1.49 (H) 0.50 - 1.10 mg/dL Final  12/22/2011 02:48 PM 1.2 0.4 - 1.2 mg/dL Final  12/13/2011 04:19 PM 1.6 (H) 0.4 - 1.2 mg/dL Final  05/16/2007 11:54 AM 1.3 (H) 0.4 - 1.2 mg/dL Final  11/07/2006 12:41 PM 1.2 0.4 - 1.2 mg/dL Final     PMHx:   Past Medical History:  Diagnosis Date   Anxiety    Asthma    Biliary colic    Carcinoid tumor, bronchus, lung, malignant ([place])    Difficult intubation    GERD (gastroesophageal reflux disease)  History of Clostridium difficile infection    Other and unspecified noninfectious gastroenteritis and colitis(558.9) 01/2001   nonspecific colitis most likely from radiation   Ovarian cancer ([place])    PONV (postoperative nausea and vomiting)    always nausea and  vomiting   Renal disorder    Right lower lobe lung mass    Small bowel obstruction ([facility])    Stroke ([facility])    Urinary tract infection    Vitamin B12 deficiency     Past Surgical History:  Procedure Laterality Date   APPENDECTOMY     BREAST ENHANCEMENT SURGERY     COLONOSCOPY  12/25/2011   Procedure: COLONOSCOPY;  Surgeon: Lafayette Dragon, MD;  Location: [facility];  Service: Endoscopy;  Laterality: N/A;   COLOSTOMY  2013   EXPLORATORY LAPAROTOMY W/ BOWEL RESECTION     HEMORRHOID SURGERY N/A 06/04/2013   Procedure: single coulum HEMORRHOIDECTOMY;  Surgeon: Odis Hollingshead, MD;  Location: [facility] ORS;   Service: General;  Laterality: N/A;   LAPAROSCOPIC CHOLECYSTECTOMY     LUNG REMOVAL, PARTIAL     rectum removal     TOTAL ABDOMINAL HYSTERECTOMY W/ BILATERAL SALPINGOOPHORECTOMY     VIDEO ASSISTED THORACOSCOPY  04/25/12   right    Family Hx:  Family History  Problem Relation Age of Onset   Stroke Mother    Rheum arthritis Mother    Hypertension Mother    Kidney cancer Other        ???? Grandfather   Bladder Cancer Maternal Grandfather    Stroke Maternal Grandmother    Healthy Son    Colon cancer Neg Hx    Esophageal cancer Neg Hx    Rectal cancer Neg Hx    Stomach cancer Neg Hx     Social History:  reports that she has never smoked. She has never used smokeless tobacco. She reports current alcohol use of about 1.0 standard drink per week. She reports that she does not use drugs.  Allergies:  Allergies  Allergen Reactions   Other Hives    Reaction to hospital sheets ( pt is allergic to TIDE and WISK)    Medications: Prior to Admission medications   Medication Sig Start Date End Date Taking? Authorizing Provider  aspirin EC 325 MG EC tablet Take 1 tablet (325 mg total) by mouth daily. 10/29/17   Rinehuls, Early Chars, PA-C  busPIRone (BUSPAR) 5 MG tablet Take 5 mg by mouth daily. 04/18/18   [provider]  gabapentin (NEURONTIN) 100 MG capsule  03/04/19   [provider]  Multiple Vitamin (MULTI-VITAMIN PO) Take 5 mLs by mouth See admin instructions. Mix 5 mls multivitamin vegetable powder in water and drink    [provider]  omeprazole (PRILOSEC) 20 MG capsule Take 20 mg by mouth at bedtime.    [provider]  ondansetron (ZOFRAN ODT) 4 MG disintegrating tablet 4mg  ODT q4 hours prn nausea/vomit 03/05/19   Mesner, Corene Cornea, MD  polyvinyl alcohol (ARTIFICIAL TEARS) 1.4 % ophthalmic solution Place 1 drop into both eyes at bedtime.    [provider]    I have reviewed the patient's current medications.  Labs:  Results for orders placed  or performed during the hospital encounter of 07/27/21 (from the past 48 hour(s))  CBC with Differential     Status: Abnormal   Collection Time: 07/27/21 10:53 AM  Result Value Ref Range   WBC 10.6 (H) 4.0 - 10.5 K/uL   RBC 2.90 (L) 3.87 - 5.11 MIL/uL  Hemoglobin 9.6 (L) 12.0 - 15.0 g/dL   HCT 28.6 (L) 36.0 - 46.0 %   MCV 98.6 80.0 - 100.0 fL   MCH 33.1 26.0 - 34.0 pg   MCHC 33.6 30.0 - 36.0 g/dL   RDW 14.8 11.5 - 15.5 %   Platelets 381 150 - 400 K/uL   nRBC 0.9 (H) 0.0 - 0.2 %   Neutrophils Relative % 82 %   Neutro Abs 8.8 (H) 1.7 - 7.7 K/uL   Lymphocytes Relative 9 %   Lymphs Abs 0.9 0.7 - 4.0 K/uL   Monocytes Relative 6 %   Monocytes Absolute 0.6 0.1 - 1.0 K/uL   Eosinophils Relative 0 %   Eosinophils Absolute 0.0 0.0 - 0.5 K/uL   Basophils Relative 1 %   Basophils Absolute 0.1 0.0 - 0.1 K/uL   Immature Granulocytes 2 %   Abs Immature Granulocytes 0.17 (H) 0.00 - 0.07 K/uL    Comment: Performed at [facility], [address] [place], [place] [address]  Brain natriuretic peptide     Status: Abnormal   Collection Time: 07/27/21 10:53 AM  Result Value Ref Range   B Natriuretic Peptide 360.0 (H) 0.0 - 100.0 pg/mL    Comment: Performed at [facility], [address] [place], [place] [address]  Comprehensive metabolic panel     Status: Abnormal   Collection Time: 07/27/21 10:53 AM  Result Value Ref Range   Sodium 134 (L) 135 - 145 mmol/L   Potassium 4.0 3.5 - 5.1 mmol/L   Chloride 116 (H) 98 - 111 mmol/L   CO2 <7 (L) 22 - 32 mmol/L    Comment: REPEATED TO VERIFY   Glucose, Bld 126 (H) 70 - 99 mg/dL    Comment: Glucose reference range applies only to samples taken after fasting for at least 8 hours.   BUN 86 (H) 8 - 23 mg/dL   Creatinine, Ser 8.64 (H) 0.44 - 1.00 mg/dL   Calcium 7.2 (L) 8.9 - 10.3 mg/dL   Total Protein 6.6 6.5 - 8.1 g/dL   Albumin 3.2 (L) 3.5 - 5.0 g/dL   AST 14 (L) 15 - 41 U/L   ALT 13 0 - 44 U/L   Alkaline Phosphatase 91 38 -  126 U/L   Total Bilirubin 0.8 0.3 - 1.2 mg/dL   GFR, Estimated 4 (L) >60 mL/min    Comment: (NOTE) Calculated using the CKD-EPI Creatinine Equation (2021)    Anion gap NOT CALCULATED 5 - 15    Comment: Performed at [facility], [address] [place], [place] [address]  CK     Status: None   Collection Time: 07/27/21 10:53 AM  Result Value Ref Range   Total CK 229 38 - 234 U/L    Comment: Performed at [facility], [address] [place], [place] [address]  Protime-INR     Status: None   Collection Time: 07/27/21 10:53 AM  Result Value Ref Range   Prothrombin Time 14.7 11.4 - 15.2 seconds   INR 1.2 0.8 - 1.2    Comment: (NOTE) INR goal varies based on device and disease states. Performed at [facility], [address] [place], [place] [address]   APTT     Status: None   Collection Time: 07/27/21 10:53 AM  Result Value Ref Range   aPTT 29 24 - 36 seconds    Comment: Performed at [facility]  [address] [place], [place] [address]  I-Stat arterial blood gas, ED     Status: Abnormal   Collection Time: 07/27/21 11:04 AM  Result Value Ref Range   pH, Arterial 6.992 (LL) 7.350 - 7.450   pCO2 arterial <15.0 (LL) 32.0 - 48.0 mmHg   pO2, Arterial 137 (H) 83.0 - 108.0 mmHg   Sodium 137 135 - 145 mmol/L   Potassium 4.1 3.5 - 5.1 mmol/L   Calcium, Ion 1.15 1.15 - 1.40 mmol/L   HCT 30.0 (L) 36.0 - 46.0 %   Hemoglobin 10.2 (L) 12.0 - 15.0 g/dL   Collection site BRACHIAL    Sample type ARTERIAL     Comment: Performed at [facility], [address] [place], [place] [address]  Lactic acid, plasma     Status: None   Collection Time: 07/27/21 12:40 PM  Result Value Ref Range   Lactic Acid, Venous 0.7 0.5 - 1.9 mmol/L    Comment: Performed at [facility], [address] [place], [place] [address]  Resp Panel by RT-PCR (Flu A&B, Covid) Nasopharyngeal Swab     Status: None   Collection Time: 07/27/21 12:40 PM    Specimen: Nasopharyngeal Swab; Nasopharyngeal(NP) swabs in vial transport medium  Result Value Ref Range   SARS Coronavirus 2 by RT PCR NEGATIVE NEGATIVE    Comment: (NOTE) SARS-CoV-2 target nucleic acids are NOT DETECTED.  The SARS-CoV-2 RNA is generally detectable in upper respiratory specimens during the acute phase of infection. The lowest concentration of SARS-CoV-2 viral copies this assay can detect is 138 copies/mL. A negative result does not preclude SARS-Cov-2 infection and should not be used as the sole basis for treatment or other patient management decisions. A negative result may occur with  improper specimen collection/handling, submission of specimen other than nasopharyngeal swab, presence of viral mutation(s) within the areas targeted by this assay, and inadequate number of viral copies(<138 copies/mL). A negative result must be combined with clinical observations, patient history, and epidemiological information. The expected result is Negative.  Fact Sheet for Patients:  EntrepreneurPulse.com.au  Fact Sheet for Healthcare Providers:  IncredibleEmployment.be  This test is no t yet approved or cleared by the [place] FDA and  has been authorized for detection and/or diagnosis of SARS-CoV-2 by FDA under an Emergency Use Authorization (EUA). This EUA will remain  in effect (meaning this test can be used) for the duration of the COVID-19 declaration under Section 564(b)(1) of the Act, 21 U.S.C.section 360bbb-3(b)(1), unless the authorization is terminated  or revoked sooner.       Influenza A by PCR NEGATIVE NEGATIVE   Influenza B by PCR NEGATIVE NEGATIVE    Comment: (NOTE) The Xpert Xpress SARS-CoV-2/FLU/RSV plus assay is intended as an aid in the diagnosis of influenza from Nasopharyngeal swab specimens and should not be used as a sole basis for treatment. Nasal washings and aspirates are unacceptable for Xpert Xpress  SARS-CoV-2/FLU/RSV testing.  Fact Sheet for Patients: EntrepreneurPulse.com.au  Fact Sheet for Healthcare Providers: IncredibleEmployment.be  This test is not yet approved or cleared by the [place] FDA and has been authorized for detection and/or diagnosis of SARS-CoV-2 by FDA under an Emergency Use Authorization (EUA). This EUA will remain in effect (meaning this test can be used) for the duration of the COVID-19 declaration under Section 564(b)(1) of the Act, 21 U.S.C. section 360bbb-3(b)(1), unless the authorization is terminated or revoked.  Performed at [facility], [address] [place], [place] [address]      ROS:  A comprehensive review of systems was negative except for: Constitutional: positive for fatigue Eyes: positive for visual disturbance Gastrointestinal: positive for dec appetite  Physical Exam: Vitals:   07/27/21 1551 07/27/21 1553  BP:  120/68  Pulse:    Resp:  (!) 22  Temp: (!) 97.4 F (36.3 C)   SpO2:  100%     General: thin, frail, wrapped in blanket-  NAD HEENT: PERRLA, EOMI, mucous membranes moist Neck: no JVD Heart: RRR Lungs: mostly clear Abdomen: thin, ostomy-  minimal tenderness Extremities: no edema Skin: warm and dry Neuro: alert, non focal   Assessment/Plan: 75 year old WF with many medical issues-  she has A on CRF in the setting of cipro ? toxicity and NSAIDs, possibly some ATN as well  1.Renal- A on CRF in the setting of above-  baseline crt in the low 2's-  followed as an OP by [facility] nephrology -  I think some ATN from low BP-- place foley and checking a U/A and a renal ultrasound to settle this issue of hydronephrosis found on CT scan-  continue hydration-  stop offending meds and follow labs and UOP-  BMP ordered q 8 hours  2. Non anion gap metabolic acidosis-  possibly due to renal failure -  or gi output loss from ostomy.  Now on bicarb drip-  follow trends 3.  Hypertension/volume  - not wet-  I am Ok with hydration of bicarb based fluids  4. Anemia  - not extreme-  no treatment needed at this time -  will likely decrease with IVF    Louis Meckel 07/27/2021, 3:58 PM

## 2021-07-27 NOTE — ED Triage Notes (Signed)
Pt states she is sob, dizziness, blurry vision for 2 days.  No known fever.  Pt has rapid respirations, fingertips blue.

## 2021-07-27 NOTE — Progress Notes (Addendum)
[facility] Progress Note Patient Name: Desiree Adams DOB: 1946/08/14 MRN: [id number]   Date of Service  07/27/2021  HPI/Events of Note  K was 2.8 from labs at 5 pm. Patient on bicarb drip and seen by renal team. CCM team note mentions to replete cautiously but no repletion was provides. Has BMP q8 ordered.  eICU Interventions  Start with 20 meq oral repletion Next BMP at around midnight Please call with results      Intervention Category Major Interventions: Acid-Base disturbance - evaluation and management  Chioma Mukherjee G Skyann Ganim 07/27/2021, 8:06 PM  Addendum 10 pm  Tylenol request for back pain - ordered.   Addendum 2:30 am Still do not see a BMP result , RN notified. Was due around midnight   Addendum 350 am Significant delay with lab, just resulted Severe hypokalemia, bicarb better at 14 and mag is 1.0 Check VBG now 40 meq IV kcl x 1  2 gram mag x 1  May need to stop the bicarb drip as it is probably contributing to lowering the K The patient needs very close monitoring of BMP and these should be sent on time - I am ordering another timed sample for 730 am  Addendum at 440 am VBG resulted Patient awake, alert, on room air, stable BP and ph is 7.4 No distress at all I feel the bicarb will drop her K further and at this point, the severe hypokalemia is concerning So will stop IV bicarb Start LR at same rate Replete the Kcl as ordered 730 am lab ordered Have also asked RN to update the renal team around 530 am about this plan for this patient with challenging acid base / electrolyte issues

## 2021-07-27 NOTE — ED Provider Notes (Signed)
[facility] Provider Note   CSN: [id number] Arrival date & time: 07/27/21  8588     History Chief Complaint  Patient presents with   Shortness of Breath    Desiree Adams is a 75 y.o. female.  75 y.o female with a PMH of asthma, carcinoid tumor of the bronchus, stroke, small bowel obstruction presents to the ED with a chief complaint of shortness of breath, blurred vision, dizziness.  Her symptoms began approximately 2 days ago, symptoms are described as difficulty with activity, shortness of breath is exacerbated with any sort of movement.  In addition, does have a dry cough.  She is also had some decrease in oral intake, she does endorse anorexia.  Also reports some blurry vision, this is described as cannot see colors, reports 1 episode today while walking to the car onto a driveway in which she could only see "white like a bright star on "and similar to previous episodes in the past.  She describes the dizziness as the room spinning.  She has had these episodes in the past.  Does report receiving care currently at [facility], due to prior history of ovarian cancer, melanoma.  Does have a colostomy in place, does report some decrease in her colostomy output, due to decrease in oral intake.  She denies any prior tobacco use, or history of blood clot, recent surgery, no recent travel or fevers or hematemesis.  The history is provided by the patient.  Shortness of Breath Severity:  Moderate Onset quality:  Gradual Duration:  2 days Timing:  Constant Progression:  Worsening Chronicity:  Recurrent Context: activity   Relieved by:  Rest Worsened by:  Movement Ineffective treatments:  None tried Associated symptoms: cough   Associated symptoms: no abdominal pain, no chest pain, no fever, no headaches, no sore throat and no vomiting   Risk factors: hx of cancer   Risk factors: no family hx of DVT, no hx of PE/DVT, no oral contraceptive use, no prolonged  immobilization, no recent surgery and no tobacco use       Past Medical History:  Diagnosis Date   Anxiety    Asthma    Biliary colic    Carcinoid tumor, bronchus, lung, malignant ([place])    Difficult intubation    GERD (gastroesophageal reflux disease)    History of Clostridium difficile infection    Other and unspecified noninfectious gastroenteritis and colitis(558.9) 01/2001   nonspecific colitis most likely from radiation   Ovarian cancer ([place])    PONV (postoperative nausea and vomiting)    always nausea and  vomiting   Renal disorder    Right lower lobe lung mass    Small bowel obstruction ([place])    Stroke ([place])    Urinary tract infection    Vitamin B12 deficiency     Patient Active Problem List   Diagnosis Date Noted   Acute renal failure (ARF) ([place]) 07/27/2021   Sjogren's syndrome ([place]) 01/01/2018   Stroke (cerebrum) ([place]) 10/26/2017   SBO (small bowel obstruction) ([place]) 10/24/2016   Melanoma of anal canal ([place]) 06/18/2013   Thrombosed external hemorrhoid-right 10/28/2012   Right lower lobe lung mass    Carcinoid tumor of lung 03/27/2012   Rectal ulcer 12/25/2011   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   VITAMIN B12 DEFICIENCY 50/27/7412   BILIARY COLIC 87/86/7672   DIARRHEA, CHRONIC 11/11/2008   NEOPLASM, MALIGNANT, OVARY, HX OF 11/11/2008   Personal history of other diseases of digestive system 11/11/2008  Past Surgical History:  Procedure Laterality Date   APPENDECTOMY     BREAST ENHANCEMENT SURGERY     COLONOSCOPY  12/25/2011   Procedure: COLONOSCOPY;  Surgeon: Lafayette Dragon, MD;  Location: [facility];  Service: Endoscopy;  Laterality: N/A;   COLOSTOMY  2013   EXPLORATORY LAPAROTOMY W/ BOWEL RESECTION     HEMORRHOID SURGERY N/A 06/04/2013   Procedure: single coulum HEMORRHOIDECTOMY;  Surgeon: Odis Hollingshead, MD;  Location: [facility] ORS;  Service: General;  Laterality: N/A;   LAPAROSCOPIC CHOLECYSTECTOMY     LUNG REMOVAL, PARTIAL     rectum removal     TOTAL  ABDOMINAL HYSTERECTOMY W/ BILATERAL SALPINGOOPHORECTOMY     VIDEO ASSISTED THORACOSCOPY  04/25/12   right     OB History   No obstetric history on file.     Family History  Problem Relation Age of Onset   Stroke Mother    Rheum arthritis Mother    Hypertension Mother    Kidney cancer Other        ???? Grandfather   Bladder Cancer Maternal Grandfather    Stroke Maternal Grandmother    Healthy Son    Colon cancer Neg Hx    Esophageal cancer Neg Hx    Rectal cancer Neg Hx    Stomach cancer Neg Hx     Social History   Tobacco Use   Smoking status: Never   Smokeless tobacco: Never  Vaping Use   Vaping Use: Never used  Substance Use Topics   Alcohol use: Yes    Alcohol/week: 1.0 standard drink    Types: 1 Glasses of wine per week    Comment: very rarely   Drug use: No    Home Medications Prior to Admission medications   Medication Sig Start Date End Date Taking? Authorizing Provider  aspirin EC 325 MG EC tablet Take 1 tablet (325 mg total) by mouth daily. 10/29/17   Rinehuls, Early Chars, PA-C  busPIRone (BUSPAR) 5 MG tablet Take 5 mg by mouth daily. 04/18/18   [provider]  gabapentin (NEURONTIN) 100 MG capsule  03/04/19   [provider]  Multiple Vitamin (MULTI-VITAMIN PO) Take 5 mLs by mouth See admin instructions. Mix 5 mls multivitamin vegetable powder in water and drink    [provider]  omeprazole (PRILOSEC) 20 MG capsule Take 20 mg by mouth at bedtime.    [provider]  ondansetron (ZOFRAN ODT) 4 MG disintegrating tablet 4mg  ODT q4 hours prn nausea/vomit 03/05/19   Mesner, Corene Cornea, MD  polyvinyl alcohol (ARTIFICIAL TEARS) 1.4 % ophthalmic solution Place 1 drop into both eyes at bedtime.    [provider]    Allergies    Other  Review of Systems   Review of Systems  Constitutional:  Positive for chills. Negative for fever.  HENT:  Negative for sore throat.   Respiratory:  Positive for cough and shortness of breath.    Cardiovascular:  Negative for chest pain, palpitations and leg swelling.  Gastrointestinal:  Negative for abdominal pain, nausea and vomiting.  Genitourinary:  Negative for flank pain.  Musculoskeletal:  Negative for back pain and myalgias.  Neurological:  Positive for dizziness and weakness. Negative for syncope, facial asymmetry and headaches.  All other systems reviewed and are negative.  Physical Exam Updated Vital Signs BP (!) 122/56   Pulse 79   Temp (!) 97.4 F (36.3 C) (Oral)   Resp 18   Ht 5\' 4"  (1.626 m)   Wt 46.7  kg   SpO2 94%   BMI 17.68 kg/m   Physical Exam Vitals and nursing note reviewed.  Constitutional:      Appearance: She is ill-appearing.     Comments: Chronically ill-appearing.  HENT:     Head: Normocephalic and atraumatic.     Mouth/Throat:     Comments: Oral mucosa appears dry.  Eyes:     Pupils: Pupils are equal, round, and reactive to light.  Cardiovascular:     Rate and Rhythm: Normal rate.  Pulmonary:     Effort: Tachypnea present.     Breath sounds: No wheezing or rhonchi.     Comments: Tachypnea is present, with rates between the high 20s-30s Chest:     Chest wall: No tenderness.  Abdominal:     Palpations: Abdomen is soft. There is no mass.     Comments: Ostomy bag is in place, without any pain with palpation to surrounding site.  Musculoskeletal:     Right lower leg: No edema.     Left lower leg: No edema.     Comments: Calf tenderness bilaterally.  No pitting edema.  Skin:    General: Skin is warm and dry.  Neurological:     Mental Status: She is alert and oriented to person, place, and time.     Cranial Nerves: No dysarthria or facial asymmetry.     Sensory: Sensation is intact. No sensory deficit.     Comments: No facial asymmetry, no dysarthria.  Moves all upper and lower extremities.    ED Results / Procedures / Treatments   Labs (all labs ordered are listed, but only abnormal results are displayed) Labs Reviewed  CBC  WITH DIFFERENTIAL/PLATELET - Abnormal; Notable for the following components:      Result Value   WBC 10.6 (*)    RBC 2.90 (*)    Hemoglobin 9.6 (*)    HCT 28.6 (*)    nRBC 0.9 (*)    Neutro Abs 8.8 (*)    Abs Immature Granulocytes 0.17 (*)    All other components within normal limits  BRAIN NATRIURETIC PEPTIDE - Abnormal; Notable for the following components:   B Natriuretic Peptide 360.0 (*)    All other components within normal limits  COMPREHENSIVE METABOLIC PANEL - Abnormal; Notable for the following components:   Sodium 134 (*)    Chloride 116 (*)    CO2 <7 (*)    Glucose, Bld 126 (*)    BUN 86 (*)    Creatinine, Ser 8.64 (*)    Calcium 7.2 (*)    Albumin 3.2 (*)    AST 14 (*)    GFR, Estimated 4 (*)    All other components within normal limits  I-STAT ARTERIAL BLOOD GAS, ED - Abnormal; Notable for the following components:   pH, Arterial 6.992 (*)    pCO2 arterial <15.0 (*)    pO2, Arterial 137 (*)    HCT 30.0 (*)    Hemoglobin 10.2 (*)    All other components within normal limits  RESP PANEL BY RT-PCR (FLU A&B, COVID) ARPGX2  CULTURE, BLOOD (ROUTINE X 2)  CULTURE, BLOOD (ROUTINE X 2)  URINE CULTURE  LACTIC ACID, PLASMA  CK  PROTIME-INR  APTT  BLOOD GAS, VENOUS  URINALYSIS, ROUTINE W REFLEX MICROSCOPIC  LACTIC ACID, PLASMA  BLOOD GAS, ARTERIAL  CBC  CREATININE, SERUM    EKG EKG Interpretation  Date/Time:  Wednesday July 27 2021 10:05:22 EDT Ventricular Rate:  84 PR Interval:  185 QRS Duration: 82 QT Interval:  379 QTC Calculation: 448 R Axis:   28 Text Interpretation: Sinus rhythm Peaked t waves compared to prior Confirmed by Regan Lemming ([id number]) on 07/27/2021 10:07:37 AM  Radiology CT ABDOMEN PELVIS WO CONTRAST  Result Date: 07/27/2021 CLINICAL DATA:  Shortness of breath, dizziness, blurry vision, bowel obstruction suspected EXAM: CT ABDOMEN AND PELVIS WITHOUT CONTRAST TECHNIQUE: Multidetector CT imaging of the abdomen and pelvis was performed  following the standard protocol without IV contrast. COMPARISON:  12/28/2018 FINDINGS: Lower chest: No acute abnormality. Hepatobiliary: No focal liver abnormality is seen. Status post cholecystectomy. No biliary dilatation. Pancreas: Unremarkable. No pancreatic ductal dilatation or surrounding inflammatory changes. Spleen: Normal in size without significant abnormality. Adrenals/Urinary Tract: Adrenal glands are unremarkable. Mild bilateral hydronephrosis and hydroureter without obstructing etiology identified to the ureterovesicular junctions. Stomach/Bowel: Stomach is within normal limits. Unchanged postoperative/post treatment appearance of the pelvis, status post abdominoperineal resection with extensive soft tissue thickening throughout the pelvis and left lower quadrant end colostomy. Multiple resection suture lines of the small bowel in the right hemiabdomen. Vascular/Lymphatic: Aortic atherosclerosis. No enlarged abdominal or pelvic lymph nodes. Reproductive: No mass or other significant abnormality. Other: No abdominal wall hernia or abnormality. No abdominopelvic ascites. Musculoskeletal: No acute or significant osseous findings. IMPRESSION: 1. Unchanged postoperative/post treatment appearance of the pelvis, status post abdominoperineal resection with extensive soft tissue thickening throughout the pelvis and left lower quadrant end colostomy. 2. Multiple small bowel resection without evidence of obstruction or other acute findings at this time. 3. Mild bilateral hydronephrosis and hydroureter without obstructing etiology identified to the ureterovesicular junctions. Aortic Atherosclerosis (ICD10-I70.0). Electronically Signed   By: Delanna Ahmadi M.D.   On: 07/27/2021 13:17   DG Chest Port 1 View  Result Date: 07/27/2021 CLINICAL DATA:  Shortness of breath, dizziness, blurred vision for 2 days. EXAM: PORTABLE CHEST 1 VIEW COMPARISON:  CT chest 06/10/2020 FINDINGS: Stable elevation of the right  hemidiaphragm. Heart is normal in size. Lungs are clear. No pleural effusion or pneumothorax. Biapical pleural thickening. No acute osseous abnormality. Breast implants. IMPRESSION: No acute cardiopulmonary abnormality. Electronically Signed   By: Ileana Roup M.D.   On: 07/27/2021 11:03    Procedures .Critical Care Performed by: Janeece Fitting, PA-C Authorized by: Janeece Fitting, PA-C   Critical care provider statement:    Critical care time (minutes):  50   Critical care start time:  07/27/2021 12:00 PM   Critical care end time:  07/27/2021 12:50 PM   Critical care was necessary to treat or prevent imminent or life-threatening deterioration of the following conditions:  Metabolic crisis   Critical care was time spent personally by me on the following activities:  Blood draw for specimens, discussions with consultants and evaluation of patient's response to treatment   Medications Ordered in ED Medications  ceFEPIme (MAXIPIME) 1 g in sodium chloride 0.9 % 100 mL IVPB (has no administration in time range)  0.9 %  sodium chloride infusion ( Intravenous New Bag/Given 07/27/21 1303)  sodium bicarbonate 150 mEq in dextrose 5 % 1,150 mL infusion ( Intravenous New Bag/Given 07/27/21 1337)  docusate sodium (COLACE) capsule 100 mg (has no administration in time range)  polyethylene glycol (MIRALAX / GLYCOLAX) packet 17 g (has no administration in time range)  heparin injection 5,000 Units (has no administration in time range)  sodium chloride 0.9 % bolus 1,000 mL ( Intravenous Stopped 07/27/21 1147)  ondansetron (ZOFRAN) injection 4 mg (4 mg Intravenous Given 07/27/21 1045)  lactated ringers  bolus 1,000 mL (1,000 mLs Intravenous New Bag/Given 07/27/21 1255)  ceFEPIme (MAXIPIME) 2 g in sodium chloride 0.9 % 100 mL IVPB (2 g Intravenous New Bag/Given 07/27/21 1305)  sodium bicarbonate injection 50 mEq (50 mEq Intravenous Given 07/27/21 1329)    ED Course  I have reviewed the triage vital signs and  the nursing notes.  Pertinent labs & imaging results that were available during my care of the patient were reviewed by me and considered in my medical decision making (see chart for details).  Clinical Course as of 07/27/21 1352  Wed Jul 27, 2021  1031 SpO2: 93 % [JS]  1032 Resp(!): 40 [JS]  1209 pH, Arterial(!!): 6.992 [JS]  1209 pCO2 arterial(!!): <15.0 [JS]  1215 Creatinine(!): 8.64 [JS]  1244 BUN(!): 86 [JS]  1247 B Natriuretic Peptide(!): 360.0 [JS]  1312 CK Total: 229 [JS]    Clinical Course User Index [JS] Janeece Fitting, PA-C   MDM Rules/Calculators/A&P   Patient with extensive past medical history including prior history of variant cancer, melanoma, lung lobe resection due to "benign tumor ", although with malignant was listed on the chart presents to the ED with a chief complaint of shortness of breath, on arrival patient appears tachypneic with respirations in the 20s to 30s, fingertips do appear blue, some suspicion for Raynaud's although not previously diagnosed.  She does endorse chills but is afebrile on arrival.  Oxygen saturations were 93% on arrival, this quickly recovered to 100% and she was not placed on any supplemental oxygen.  Son at the bedside is providing some collateral information, stating patient has had these episodes in the past.  He is concerned as she has become more winded in the last 2 days.  She has not tried anything for her symptoms.  During my evaluation patient is overall chronically ill-appearing, she is very thin, blood pressure is the softer side, after reviewing prior echo with an EF of 60 to 65%, will provide her with a bolus for hydration.  Lungs are coarse throughout, but without any obvious wheezing or rales.  She denies any recent travel or recent COVID-19 infections.  In the setting of hypoxia, and lungs and history of cancer I do have some suspicion for pulmonary embolism at this time.  Abdomen is soft, ostomy bag is some decreased output.  No  calf tenderness bilaterally, no edema. Extensive chart reviews includes patient's prior visits due to recurrent bowel obstructions, she does report some decrease in oral intake today.  Some suspicion for this as well.  Labs on todays visit, were critical changed from her prior visit, CBC with a leukocytosis with of 10, hemoglobin slightly decrease but she denies any hematemesis or blood in stool. CMP remarkable for a CO2 <7, BUN of 86 along with creatine of 8.64 however potassium is within normal limits. ABG obtained with a pH of 6.9 with these critical levels suspect patient will need ICU care, called placed to  critical care for further recommendations.  12:37 PM Spoke to Dr. Clover Mealy from nephrology, who recommended fluids along with bicarb. Will need to reasses prior to dialysis if this is needed.   Given bicarb push of 3, along with cefepime to cover for broad spectrum.   12:55 PM Spoke Dr. Tamala Julian, critical care who will admit patient for further management.   1:09 PM Spoke to son Rolena Infante who was informed on patients admission to the hospital. Reports patients symptoms have been ongoing for many months now. According to him, she has had  problems with her kidneys "being weak". She is being followed by Dr. Rosanna Randy   1:51 PM Son Rolena Infante, reports patient has been fighting UTIs for the whole summer. He is unsure whether this is related to his symptoms.   Portions of this note were generated with [employer]. Dictation errors may occur despite best attempts at proofreading.  Final Clinical Impression(s) / ED Diagnoses Final diagnoses:  SOB (shortness of breath)  Renal failure, unspecified chronicity    Rx / DC Orders ED Discharge Orders     None        Janeece Fitting, PA-C 07/27/21 1352    Regan Lemming, MD 07/27/21 Erskin Burnet    Regan Lemming, MD 07/27/21 1737

## 2021-07-27 NOTE — H&P (Incomplete)
NAME:  DAWNYEL LEVEN, MRN:  [id number], DOB:  1945/10/24, LOS: 0 ADMISSION DATE:  07/27/2021, CONSULTATION DATE:  07/27/2021 REFERRING MD:  [facility], CHIEF COMPLAINT:  SOB   History of Present Illness:  V.T. is a 75 year old caucasian female who presented to [facility] ED with worsening SOB x 2 days, dizziness, blurred vision, tachypnea, anorexia, and chills. 1 week prior to symptom onset she went to see her PCP for cellulitis r/t a cut on her Left hand and was given cipro. She also was taking naproxen for pain in her hand. Three days into the abx course she reports her hand was getting better but she started to have the vision changes, anorexia, light headedness, SOB and tachypnea. She stopped taking the cipro after 3-4 days and did not complete the course. She denies fever, pain, headaches, sore throat, nausea or vomiting.  She reports a dry cough and SOB that constant, worse with activity. Pt. denies hx of blood clots and any tobacco use.   Pt has a complex PMH of CKD (baseline: 2.5, not on HD), cancer (ovarian cancer; melanoma s/p bowel resections; carcinoid tumor, bronchus, lung, malignant), colostomy, multiple SBOs, Anxiety, Stroke, and UTIs. Of note, 06/2021 Pt. found to have new colonic masses on PET Scan and subsequent colonoscopy findings from 07/2021 are not indicative of malignancy, rather positive for lipoma.  In [facility] ED, Pt found to have metabolic acidosis: pH: 6.9, PCO2: <15.0. And worsening renal failure: Scr: 8.6. Received multiple ampule's of sodium bicarb prior to transfer to [facility], started on cefepime. Pt. Transferred to [facility] ICU for further workup.   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 Start Cefepime. D/c cefepime.    Interim History / Subjective:  She reports a dry cough, tachypnea, and SOB. Says she is gradually feeling "worse" but does feel like she is making urine and would like to drink.   Objective   Blood pressure (!) 120/59, pulse 87, temperature (!) 97.4 F (36.3 C), temperature source Oral, resp. rate (!) 24, height 5\' 4"  (1.626 m), weight 46.7 kg, SpO2 100 %.        Intake/Output Summary (Last 24 hours) at 07/27/2021 1303 Last data filed at 07/27/2021 1226 Gross per 24 hour  Intake 1000.79 ml  Output --  Net 1000.79 ml   Filed Weights   07/27/21 1002  Weight: 46.7 kg    Examination: General: Ill appealing female.  HENT: No JVD Lungs: RA, clear, non-labored. Cardiovascular: S1, S2. RRR Abdomen: colostomy present LLQ. Diffuse tenderness to palpitation. Did not assess stoma, opaque ostomy bag present.  Extremities: moves extremities ad lib. No edema. Neuro: Aox4, follows commands.  GU: Prima-fit in place, Foley ordered. Skin: intact; L hand former site of cellulitis intact, no redness, swelling, drainage, or signs of infection.  Resolved Hospital Problem list    Assessment & Plan:   Metabolic Acidosis 2/2 Acute on Chronic Renal failure  Concern for nephrotoxicity d/t recent cipro &  naproxen administration - pH: 6.992   pCO2: <15   pO2: 137 - SrCr: 8.64     BUN: 86 - BNP: 360 Plan: - C/s Nephrology  - Renal US -  Urine Na, Cr - UA - UCx  - Foley - Cont. HCO3+D5 gtt - Renal dosing considerations, avoid nephrotoxic agents  R/o Sepsis 2/2 possible urinary tract obstruction vs. Infection Mild Leukocytosis  WBC: 10.9 Fluid resuscitation Plan: - Ucx - Blood Cx - D/c cefepime. Consider restarting if cultures and/or patient condition worsens.  Electrolyte Abnormalities Plan: - Trend, follow, supplement as indicated    Best Practice (right click and  "Reselect all SmartList Selections" daily)   Diet/type: NPO DVT prophylaxis: LMWH GI prophylaxis: N/A Lines: N/A Foley:  N/A Code Status:  full code Last date of multidisciplinary goals of care discussion [07/27/2021 w/ patient and son at bedside]  Labs   CBC: Recent Labs  Lab 07/27/21 1053 07/27/21 1104  WBC 10.6*  --   NEUTROABS 8.8*  --   HGB 9.6* 10.2*  HCT 28.6* 30.0*  MCV 98.6  --   PLT 381  --     Basic Metabolic Panel: Recent Labs  Lab 07/27/21 1053 07/27/21 1104  NA 134* 137  K 4.0 4.1  CL 116*  --   CO2 <7*  --   GLUCOSE 126*  --   BUN 86*  --   CREATININE 8.64*  --   CALCIUM 7.2*  --    GFR: Estimated Creatinine Clearance: 4.2 mL/min (A) (by C-G formula based on SCr of 8.64 mg/dL (H)). Recent Labs  Lab 07/27/21 1053  WBC 10.6*    Liver Function Tests: Recent Labs  Lab 07/27/21 1053  AST 14*  ALT 13  ALKPHOS 91  BILITOT 0.8  PROT 6.6  ALBUMIN 3.2*   No results for input(s): LIPASE, AMYLASE in the last 168 hours. No results for input(s): AMMONIA in the last 168 hours.  ABG    Component Value Date/Time   PHART 6.992 (LL) 07/27/2021 1104   PCO2ART <15.0 (LL) 07/27/2021 1104   PO2ART 137 (H) 07/27/2021 1104   HCO3 22.2 04/26/2012 0530   TCO2 23.5 04/26/2012 0530   ACIDBASEDEF 2.4 (H) 04/26/2012 0530   O2SAT 97.1 04/26/2012 0530     Coagulation Profile: Recent Labs  Lab 07/27/21 1053  INR 1.2    Cardiac Enzymes: Recent Labs  Lab 07/27/21 1053  CKTOTAL 229    HbA1C: Hgb A1c MFr Bld  Date/Time Value Ref Range Status  10/27/2017 04:36 AM 5.2 4.8 - 5.6 % Final    Comment:    (NOTE) Pre diabetes:          5.7%-6.4% Diabetes:              >6.4% Glycemic control for   <7.0% adults with diabetes     CBG: No results for input(s): GLUCAP in the last 168 hours.  Review of Systems:   General: Ill presenting female Neuro: negative. HENT: intermittent blurred vision, light headedness  Resp: increased SOB and  tachpynea CV: negative. GI: tenderness, slight decrease in ostomy output the last few days (believed to be r/t decreased PO intake) GU: negative. Skin: L hand site of cellulitis healed. Extremities: negative.  Past Medical History:  She,  has a past medical history of Anxiety, Asthma, Biliary colic, Carcinoid tumor, bronchus, lung, malignant ([place]), Difficult intubation, GERD (gastroesophageal reflux disease), History of Clostridium difficile infection, Other and unspecified noninfectious gastroenteritis and colitis(558.9) (01/2001), Ovarian cancer ([place]), PONV (postoperative nausea and vomiting), Renal disorder, Right lower lobe lung mass, Small bowel obstruction ([place]), Stroke ([place]), Urinary tract infection, and Vitamin B12 deficiency.   Surgical History:   Past Surgical History:  Procedure Laterality Date   APPENDECTOMY     BREAST ENHANCEMENT SURGERY     COLONOSCOPY  12/25/2011   Procedure: COLONOSCOPY;  Surgeon: Lafayette Dragon, MD;  Location: [facility]COPY;  Service: Endoscopy;  Laterality: N/A;   COLOSTOMY  2013   EXPLORATORY LAPAROTOMY W/ BOWEL RESECTION     HEMORRHOID SURGERY N/A 06/04/2013   Procedure: single coulum HEMORRHOIDECTOMY;  Surgeon: Odis Hollingshead, MD;  Location: [facility] ORS;  Service: General;  Laterality: N/A;   LAPAROSCOPIC CHOLECYSTECTOMY     LUNG REMOVAL, PARTIAL     rectum removal     TOTAL ABDOMINAL HYSTERECTOMY W/ BILATERAL SALPINGOOPHORECTOMY     VIDEO ASSISTED THORACOSCOPY  04/25/12   right     Social History:   reports that she has never smoked. She has never used smokeless tobacco. She reports current alcohol use of about 1.0 standard drink per week. She reports that she does not use drugs.   Family History:  Her family history includes Bladder Cancer in her maternal grandfather; Healthy in her son; Hypertension in her mother; Kidney cancer in an other family member; Rheum arthritis in her mother; Stroke in her maternal grandmother and mother. There is no history  of Colon cancer, Esophageal cancer, Rectal cancer, or Stomach cancer.   Allergies Allergies  Allergen Reactions   Other Hives    Reaction to hospital sheets ( pt is allergic to TIDE and WISK)     Home Medications  Prior to Admission medications   Medication Sig Start Date End Date Taking? Authorizing Provider  aspirin EC 325 MG EC tablet Take 1 tablet (325 mg total) by mouth daily. 10/29/17   Rinehuls, Early Chars, PA-C  busPIRone (BUSPAR) 5 MG tablet Take 5 mg by mouth daily. 04/18/18   [provider]  gabapentin (NEURONTIN) 100 MG capsule  03/04/19   [provider]  Multiple Vitamin (MULTI-VITAMIN PO) Take 5 mLs by mouth See admin instructions. Mix 5 mls multivitamin vegetable powder in water and drink    [provider]  omeprazole (PRILOSEC) 20 MG capsule Take 20 mg by mouth at bedtime.    [provider]  ondansetron (ZOFRAN ODT) 4 MG disintegrating tablet 4mg  ODT q4 hours prn nausea/vomit 03/05/19   Mesner, Corene Cornea, MD  polyvinyl alcohol (ARTIFICIAL TEARS) 1.4 % ophthalmic solution Place 1 drop into both eyes at bedtime.    [provider]     Critical care time: ***

## 2021-07-27 NOTE — H&P (Signed)
NAME:  Desiree Adams, MRN:  [id number], DOB:  11-06-1945, LOS: 0 ADMISSION DATE:  07/27/2021, CONSULTATION DATE:  10/26 REFERRING MD:  Armandina Gemma, CHIEF COMPLAINT:  acute renal failure    History of Present Illness:  This is a 75 year old female w/ complex medical hx as outlined below. Presented to the ER 10/26 w/ cc: approx 2 d hx of: shortness of breath, dizziness, blurred vision, decreased appetite, decreased oral intake. This was approximately 3 days into treatment for cellulitis of the left hand for which she was treated w/ Cipro and naproxen.  In er initial eval: CO2 < 7, BUN 86, cr 8.64, ph 6.9, Nephrology consulted in ER. Recommened Bicarb replacement w/ IVFs.  CT abd ordered PCCM asked to admit.   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 new foci of increased uptake, predominantly in the transverse and ascending colon.  Mildly FDG avid soft tissue surrounding the right breast implant.  Had Colonoscopy 10/3 healthy appearing mucosa.   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   Significant Hospital Events: Including procedures, antibiotic start and stop dates in addition to other pertinent events   103/100: 75 year old female. Admitted w/ cc: weakness, dizziness, decreased appetite. initial eval: CO2 < 7, BUN 86, cr 8.64, ph 6.9, Nephrology consulted in ER. Recommened Bicarb replacement w/ IVFs.  CT abd: post-op changes pelvis, w/ extensive soft tissue thickening t/o the pelvis. Mild  bilateral hydronephrosis and hydroureter w/out obstruction. Cultures sent. Empiric abx started Cefepime. PCCM asked to admit   Interim History / Subjective:  No distress  Objective   Blood pressure (Abnormal) 120/59, pulse 87, temperature (Abnormal) 97.4 F (36.3 C), temperature source Oral, resp. rate (Abnormal) 24, height 5\' 4"  (1.626 m), weight 46.7 kg, SpO2 100 %.        Intake/Output Summary (Last 24 hours) at 07/27/2021 1258 Last data filed at 07/27/2021 1226 Gross per 24 hour  Intake 1000.79 ml  Output no documentation  Net 1000.79 ml   Filed Weights   07/27/21 1002  Weight: 46.7 kg    Examination: General: chronically ill appearing female resting in bed HENT: MM dry no JVD sclera not icteric Lungs: clear no accessory use  Cardiovascular: RRR  Abdomen: a little tender but non-specific ostomy is unremarkable  Extremities: warm and dry  Neuro: awake and oriented GU: due to void   Resolved Hospital Problem list    Assessment & Plan:   Acute on chronic renal failure. H/o CKD stage IV w/ new Bilateral hydronephrosis & hydroureter. Bl cr 2.3 range. Suspect acute injury mixed picture from cipro and naproxen, as well as volume depletion. Not sure of the significance of the mild bilateral hydro.  Plan IV hydration w/ bicarb supplementation UA, urine Na and Creatinine, send UC Ck renal US  Place foley Strict I&O Serial chemistries Formal renal consult pending  R/o urinary tract infection>has h/o recurrent/chronic UTIs Barely meets SIRS criteria I do not think she is infected Plan Ck UC   Metabolic acidosis (NAGMA);  suspect 2/2 renal failure  Plan Bicarb replacement  Serial chem Strict I&O Formal renal consult pending   Dyspnea/ increased work of breathing 2/2 severe metabolic acidosis Plan Supportive care Rx acidosis  Pulse ox  Fluid and Electrolyte imbalance: Hyponatremia, hyperchloremia Plan Cont IV hydration Serial chem  Replace and recheck as  indicated  Anemia of chronic disease Plan  Trend cbc  H/o cancer: melanoma (anal requiring diverting colostomy  and LUExt), ovarian cancer s/p TAH-BSO Plan F/u w/ onc as out-pt     Best Practice (right click and "Reselect all SmartList Selections" daily)   Diet/type: Regular consistency (see orders) DVT prophylaxis: prophylactic heparin  GI prophylaxis: N/A Lines: N/A Foley:  N/A Code Status:  full code Last date of multidisciplinary goals of care discussion [pending ]  Labs   CBC: Recent Labs  Lab 07/27/21 1053 07/27/21 1104  WBC 10.6*  --   NEUTROABS 8.8*  --   HGB 9.6* 10.2*  HCT 28.6* 30.0*  MCV 98.6  --   PLT 381  --     Basic Metabolic Panel: Recent Labs  Lab 07/27/21 1053 07/27/21 1104  NA 134* 137  K 4.0 4.1  CL 116*  --   CO2 <7*  --   GLUCOSE 126*  --   BUN 86*  --   CREATININE 8.64*  --   CALCIUM 7.2*  --    GFR: Estimated Creatinine Clearance: 4.2 mL/min (A) (by C-G formula based on SCr of 8.64 mg/dL (H)). Recent Labs  Lab 07/27/21 1053  WBC 10.6*    Liver Function Tests: Recent Labs  Lab 07/27/21 1053  AST 14*  ALT 13  ALKPHOS 91  BILITOT 0.8  PROT 6.6  ALBUMIN 3.2*   No results for input(s): LIPASE, AMYLASE in the last 168 hours. No results for input(s): AMMONIA in the last 168 hours.  ABG    Component Value Date/Time   PHART 6.992 (LL) 07/27/2021 1104   PCO2ART <15.0 (LL) 07/27/2021 1104   PO2ART 137 (H) 07/27/2021 1104   HCO3 22.2 04/26/2012 0530   TCO2 23.5 04/26/2012 0530   ACIDBASEDEF 2.4 (H) 04/26/2012 0530   O2SAT 97.1 04/26/2012 0530     Coagulation Profile: Recent Labs  Lab 07/27/21 1053  INR 1.2    Cardiac Enzymes: No results for input(s): CKTOTAL, CKMB, CKMBINDEX, TROPONINI in the last 168 hours.  HbA1C: Hgb A1c MFr Bld  Date/Time Value Ref Range Status  10/27/2017 04:36 AM 5.2 4.8 - 5.6 % Final    Comment:    (NOTE) Pre diabetes:          5.7%-6.4% Diabetes:              >6.4% Glycemic  control for   <7.0% adults with diabetes     CBG: No results for input(s): GLUCAP in the last 168 hours.  Review of Systems:   Review of Systems  Constitutional:  Positive for malaise/fatigue. Negative for fever.  HENT: Negative.    Eyes:  Positive for blurred vision and double vision.  Respiratory:  Positive for shortness of breath.   Cardiovascular: Negative.   Gastrointestinal:  Positive for nausea.  Genitourinary: Negative.   Musculoskeletal: Negative.   Skin: Negative.   Neurological:  Positive for dizziness and weakness.  Endo/Heme/Allergies: Negative.   Psychiatric/Behavioral: Negative.      Past Medical History:  She,  has a past medical history of Anxiety, Asthma, Biliary colic, Carcinoid tumor, bronchus, lung, malignant ([place]), Difficult intubation, GERD (gastroesophageal reflux disease),  History of Clostridium difficile infection, Other and unspecified noninfectious gastroenteritis and colitis(558.9) (01/2001), Ovarian cancer ([place]), PONV (postoperative nausea and vomiting), Renal disorder, Right lower lobe lung mass, Small bowel obstruction ([place]), Stroke ([place]), Urinary tract infection, and Vitamin B12 deficiency.   Surgical History:   Past Surgical History:  Procedure Laterality Date   APPENDECTOMY     BREAST ENHANCEMENT SURGERY     COLONOSCOPY  12/25/2011   Procedure: COLONOSCOPY;  Surgeon: Lafayette Dragon, MD;  Location: [facility];  Service: Endoscopy;  Laterality: N/A;   COLOSTOMY  2013   EXPLORATORY LAPAROTOMY W/ BOWEL RESECTION     HEMORRHOID SURGERY N/A 06/04/2013   Procedure: single coulum HEMORRHOIDECTOMY;  Surgeon: Odis Hollingshead, MD;  Location: [facility]S;  Service: General;  Laterality: N/A;   LAPAROSCOPIC CHOLECYSTECTOMY     LUNG REMOVAL, PARTIAL     rectum removal     TOTAL ABDOMINAL HYSTERECTOMY W/ BILATERAL SALPINGOOPHORECTOMY     VIDEO ASSISTED THORACOSCOPY  04/25/12   right     Social History:   reports that she has never smoked. She has never  used smokeless tobacco. She reports current alcohol use of about 1.0 standard drink per week. She reports that she does not use drugs.   Family History:  Her family history includes Bladder Cancer in her maternal grandfather; Healthy in her son; Hypertension in her mother; Kidney cancer in an other family member; Rheum arthritis in her mother; Stroke in her maternal grandmother and mother. There is no history of Colon cancer, Esophageal cancer, Rectal cancer, or Stomach cancer.   Allergies Allergies  Allergen Reactions   Other Hives    Reaction to hospital sheets ( pt is allergic to TIDE and WISK)     Home Medications  Prior to Admission medications   Medication Sig Start Date End Date Taking? Authorizing Provider  aspirin EC 325 MG EC tablet Take 1 tablet (325 mg total) by mouth daily. 10/29/17   Rinehuls, Early Chars, PA-C  busPIRone (BUSPAR) 5 MG tablet Take 5 mg by mouth daily. 04/18/18   [provider]  gabapentin (NEURONTIN) 100 MG capsule  03/04/19   [provider]  Multiple Vitamin (MULTI-VITAMIN PO) Take 5 mLs by mouth See admin instructions. Mix 5 mls multivitamin vegetable powder in water and drink    [provider]  omeprazole (PRILOSEC) 20 MG capsule Take 20 mg by mouth at bedtime.    [provider]  ondansetron (ZOFRAN ODT) 4 MG disintegrating tablet 4mg  ODT q4 hours prn nausea/vomit 03/05/19   Mesner, Corene Cornea, MD  polyvinyl alcohol (ARTIFICIAL TEARS) 1.4 % ophthalmic solution Place 1 drop into both eyes at bedtime.    [provider]     Critical care time: 34 min   Erick Colace ACNP-BC [place] Pager # [phone] OR # [phone] if no answer

## 2021-07-27 NOTE — Progress Notes (Signed)
[facility] is following sepsis bundle. 

## 2021-07-28 ENCOUNTER — Encounter (HOSPITAL_COMMUNITY): Payer: Self-pay | Admitting: Certified Registered Nurse Anesthetist

## 2021-07-28 ENCOUNTER — Inpatient Hospital Stay (HOSPITAL_COMMUNITY): Payer: Medicare HMO

## 2021-07-28 ENCOUNTER — Encounter (HOSPITAL_COMMUNITY)
Admission: EM | Disposition: A | Discharge status: Home / Self Care | Payer: Self-pay | Source: Home / Self Care | Attending: Family Medicine

## 2021-07-28 DIAGNOSIS — N189 Chronic kidney disease, unspecified: Secondary | ICD-10-CM

## 2021-07-28 DIAGNOSIS — N179 Acute kidney failure, unspecified: Secondary | ICD-10-CM | POA: Diagnosis not present

## 2021-07-28 DIAGNOSIS — E43 Unspecified severe protein-calorie malnutrition: Secondary | ICD-10-CM | POA: Insufficient documentation

## 2021-07-28 LAB — CBC
HCT: 21.9 % — ABNORMAL LOW (ref 36.0–46.0)
HCT: 20.8 % — ABNORMAL LOW (ref 36.0–46.0)
Hemoglobin: 7.7 g/dL — ABNORMAL LOW (ref 12.0–15.0)
Hemoglobin: 7.1 g/dL — ABNORMAL LOW (ref 12.0–15.0)
MCH: 33 pg (ref 26.0–34.0)
MCH: 32.4 pg (ref 26.0–34.0)
MCHC: 35.2 g/dL (ref 30.0–36.0)
MCHC: 34.1 g/dL (ref 30.0–36.0)
MCV: 94 fL (ref 80.0–100.0)
MCV: 95 fL (ref 80.0–100.0)
Platelets: 302 10*3/uL (ref 150–400)
Platelets: 279 10*3/uL (ref 150–400)
RBC: 2.33 MIL/uL — ABNORMAL LOW (ref 3.87–5.11)
RBC: 2.19 MIL/uL — ABNORMAL LOW (ref 3.87–5.11)
RDW: 13.5 % (ref 11.5–15.5)
RDW: 14 % (ref 11.5–15.5)
WBC: 6.8 10*3/uL (ref 4.0–10.5)
WBC: 8.6 10*3/uL (ref 4.0–10.5)
nRBC: 0.6 % — ABNORMAL HIGH (ref 0.0–0.2)
nRBC: 0.2 % (ref 0.0–0.2)

## 2021-07-28 LAB — URINALYSIS, COMPLETE (UACMP) WITH MICROSCOPIC
Bilirubin Urine: NEGATIVE
Glucose, UA: NEGATIVE mg/dL
Ketones, ur: NEGATIVE mg/dL
Nitrite: NEGATIVE
Protein, ur: 100 mg/dL — AB
Specific Gravity, Urine: 1.009 (ref 1.005–1.030)
WBC, UA: >50 WBC/hpf — ABNORMAL HIGH (ref 0–5)
pH: 5 (ref 5.0–8.0)

## 2021-07-28 LAB — MAGNESIUM
Magnesium: 1 mg/dL — ABNORMAL LOW (ref 1.7–2.4)
Magnesium: 2.1 mg/dL (ref 1.7–2.4)

## 2021-07-28 LAB — POCT I-STAT 7, (LYTES, BLD GAS, ICA,H+H)
Acid-base deficit: 8 mmol/L — ABNORMAL HIGH (ref 0.0–2.0)
Bicarbonate: 15.6 mmol/L — ABNORMAL LOW (ref 20.0–28.0)
Calcium, Ion: 0.82 mmol/L — CL (ref 1.15–1.40)
HCT: 19 % — ABNORMAL LOW (ref 36.0–46.0)
Hemoglobin: 6.5 g/dL — CL (ref 12.0–15.0)
O2 Saturation: 98 %
Patient temperature: 98.7
Potassium: 2.1 mmol/L — CL (ref 3.5–5.1)
Sodium: 139 mmol/L (ref 135–145)
TCO2: 16 mmol/L — ABNORMAL LOW (ref 22–32)
pCO2 arterial: 23.9 mmHg — ABNORMAL LOW (ref 32.0–48.0)
pH, Arterial: 7.422 (ref 7.350–7.450)
pO2, Arterial: 100 mmHg (ref 83.0–108.0)

## 2021-07-28 LAB — BASIC METABOLIC PANEL
Anion gap: 13 (ref 5–15)
Anion gap: 13 (ref 5–15)
Anion gap: 11 (ref 5–15)
BUN: 81 mg/dL — ABNORMAL HIGH (ref 8–23)
BUN: 79 mg/dL — ABNORMAL HIGH (ref 8–23)
BUN: 70 mg/dL — ABNORMAL HIGH (ref 8–23)
CO2: 14 mmol/L — ABNORMAL LOW (ref 22–32)
CO2: 15 mmol/L — ABNORMAL LOW (ref 22–32)
CO2: 16 mmol/L — ABNORMAL LOW (ref 22–32)
Calcium: 5.7 mg/dL — CL (ref 8.9–10.3)
Calcium: 5.4 mg/dL — CL (ref 8.9–10.3)
Calcium: 6 mg/dL — CL (ref 8.9–10.3)
Chloride: 111 mmol/L (ref 98–111)
Chloride: 108 mmol/L (ref 98–111)
Chloride: 110 mmol/L (ref 98–111)
Creatinine, Ser: 8.17 mg/dL — ABNORMAL HIGH (ref 0.44–1.00)
Creatinine, Ser: 7.86 mg/dL — ABNORMAL HIGH (ref 0.44–1.00)
Creatinine, Ser: 7.1 mg/dL — ABNORMAL HIGH (ref 0.44–1.00)
GFR, Estimated: 5 mL/min — ABNORMAL LOW (ref >60)
GFR, Estimated: 5 mL/min — ABNORMAL LOW (ref >60)
GFR, Estimated: 6 mL/min — ABNORMAL LOW (ref >60)
Glucose, Bld: 126 mg/dL — ABNORMAL HIGH (ref 70–99)
Glucose, Bld: 110 mg/dL — ABNORMAL HIGH (ref 70–99)
Glucose, Bld: 118 mg/dL — ABNORMAL HIGH (ref 70–99)
Potassium: 2.2 mmol/L — CL (ref 3.5–5.1)
Potassium: 2.4 mmol/L — CL (ref 3.5–5.1)
Potassium: 3.3 mmol/L — ABNORMAL LOW (ref 3.5–5.1)
Sodium: 138 mmol/L (ref 135–145)
Sodium: 136 mmol/L (ref 135–145)
Sodium: 137 mmol/L (ref 135–145)

## 2021-07-28 LAB — GLUCOSE, CAPILLARY
Glucose-Capillary: 105 mg/dL — ABNORMAL HIGH (ref 70–99)
Glucose-Capillary: 107 mg/dL — ABNORMAL HIGH (ref 70–99)
Glucose-Capillary: 119 mg/dL — ABNORMAL HIGH (ref 70–99)
Glucose-Capillary: 123 mg/dL — ABNORMAL HIGH (ref 70–99)
Glucose-Capillary: 126 mg/dL — ABNORMAL HIGH (ref 70–99)
Glucose-Capillary: 99 mg/dL (ref 70–99)

## 2021-07-28 LAB — SODIUM, URINE, RANDOM: Sodium, Ur: 100 mmol/L

## 2021-07-28 LAB — PHOSPHORUS: Phosphorus: 6.8 mg/dL — ABNORMAL HIGH (ref 2.5–4.6)

## 2021-07-28 LAB — URINE CULTURE: Special Requests: NORMAL

## 2021-07-28 LAB — CREATININE, URINE, RANDOM: Creatinine, Urine: 33.49 mg/dL

## 2021-07-28 SURGERY — CYSTOSCOPY, WITH RETROGRADE PYELOGRAM AND URETERAL STENT INSERTION
Anesthesia: Choice | Laterality: Bilateral

## 2021-07-28 MED ORDER — ENSURE ENLIVE PO LIQD
237.0000 mL | Freq: Three times a day (TID) | ORAL | Status: DC
  Administered 2021-07-28 – 2021-07-29 (×2): 237 mL via ORAL

## 2021-07-28 MED ORDER — SODIUM CHLORIDE 0.9 % IV SOLN: INTRAVENOUS | Status: AC

## 2021-07-28 MED ORDER — ADULT MULTIVITAMIN W/MINERALS CH
1.0000 | ORAL_TABLET | Freq: Every day | ORAL | Status: DC
  Administered 2021-07-28 – 2021-07-31 (×3): 1 via ORAL
  Filled 2021-07-28 (×5): qty 1

## 2021-07-28 MED ORDER — LACTATED RINGERS IV SOLN: INTRAVENOUS | Status: DC

## 2021-07-28 MED ORDER — POTASSIUM CHLORIDE CRYS ER 20 MEQ PO TBCR
40.0000 meq | EXTENDED_RELEASE_TABLET | ORAL | Status: AC
  Administered 2021-07-28 (×2): 40 meq via ORAL
  Filled 2021-07-28: qty 2

## 2021-07-28 MED ORDER — MAGNESIUM SULFATE 2 GM/50ML IV SOLN
2.0000 g | Freq: Once | INTRAVENOUS | Status: AC
  Administered 2021-07-28: 2 g via INTRAVENOUS
  Filled 2021-07-28: qty 50

## 2021-07-28 MED ORDER — CALCIUM GLUCONATE-NACL 1-0.675 GM/50ML-% IV SOLN
1.0000 g | Freq: Once | INTRAVENOUS | Status: AC
  Administered 2021-07-28: 1000 mg via INTRAVENOUS
  Filled 2021-07-28: qty 50

## 2021-07-28 MED ORDER — POTASSIUM CHLORIDE 10 MEQ/100ML IV SOLN
10.0000 meq | INTRAVENOUS | Status: AC
  Administered 2021-07-28 (×4): 10 meq via INTRAVENOUS
  Filled 2021-07-28 (×4): qty 100

## 2021-07-28 MED ORDER — POTASSIUM CHLORIDE CRYS ER 20 MEQ PO TBCR
40.0000 meq | EXTENDED_RELEASE_TABLET | Freq: Once | ORAL | Status: AC
  Administered 2021-07-28: 40 meq via ORAL
  Filled 2021-07-28: qty 2

## 2021-07-28 MED ORDER — ENSURE ENLIVE PO LIQD: 237.0000 mL | Freq: Three times a day (TID) | ORAL | Status: DC

## 2021-07-28 MED ORDER — POTASSIUM CHLORIDE CRYS ER 20 MEQ PO TBCR
40.0000 meq | EXTENDED_RELEASE_TABLET | Freq: Two times a day (BID) | ORAL | Status: DC
  Filled 2021-07-28: qty 2

## 2021-07-28 MED ORDER — SODIUM CHLORIDE 0.9 % IV SOLN
1.0000 g | INTRAVENOUS | Status: DC
  Administered 2021-07-28 – 2021-07-31 (×4): 1 g via INTRAVENOUS
  Filled 2021-07-28 (×4): qty 10

## 2021-07-28 NOTE — Progress Notes (Addendum)
1 Day Post-Op Subjective: Pain controlled. Having intermittent nausea and emesis. Tolerating foley.  Objective: Vital signs in last 24 hours: Temp:  [97.2 F (36.2 C)-98.5 F (36.9 C)] 98.5 F (36.9 C) (10/28 0724) Pulse Rate:  [69-87] 78 (10/28 0500) Resp:  [14-25] 19 (10/28 0500) BP: (89-127)/(43-81) 126/64 (10/28 0500) SpO2:  [96 %-100 %] 99 % (10/28 0500) Weight:  [51.5 kg] 51.5 kg (10/28 0500)  Intake/Output from previous day: 10/27 0701 - 10/28 0700 In: 4490.6 [I.V.:4067.4; IV Piggyback:423.2] Out: 2172 [Urine:1337; Stool:835] Intake/Output this shift: No intake/output data recorded. UOP: 1.3L clear yellow  Physical Exam:  General: Alert and oriented CV: RRR Lungs: Clear Abdomen: Soft, ND, NT Ext: NT, No erythema  Lab Results: Recent Labs    07/28/21 0407 07/28/21 1606 07/29/21 0013  HGB 6.5* 7.1* 7.1*  HCT 19.0* 20.8* 20.8*   BMET Recent Labs    07/28/21 1606 07/29/21 0013  NA 137 138  K 3.3* 3.6  CL 110 113*  CO2 16* 13*  GLUCOSE 118* 118*  BUN 70* 67*  CREATININE 7.10* 6.56*  CALCIUM 6.0* 6.4*     Studies/Results: CT ABDOMEN PELVIS WO CONTRAST  Result Date: 07/27/2021 CLINICAL DATA:  Shortness of breath, dizziness, blurry vision, bowel obstruction suspected EXAM: CT ABDOMEN AND PELVIS WITHOUT CONTRAST TECHNIQUE: Multidetector CT imaging of the abdomen and pelvis was performed following the standard protocol without IV contrast. COMPARISON:  12/28/2018 FINDINGS: Lower chest: No acute abnormality. Hepatobiliary: No focal liver abnormality is seen. Status post cholecystectomy. No biliary dilatation. Pancreas: Unremarkable. No pancreatic ductal dilatation or surrounding inflammatory changes. Spleen: Normal in size without significant abnormality. Adrenals/Urinary Tract: Adrenal glands are unremarkable. Mild bilateral hydronephrosis and hydroureter without obstructing etiology identified to the ureterovesicular junctions. Stomach/Bowel: Stomach is  within normal limits. Unchanged postoperative/post treatment appearance of the pelvis, status post abdominoperineal resection with extensive soft tissue thickening throughout the pelvis and left lower quadrant end colostomy. Multiple resection suture lines of the small bowel in the right hemiabdomen. Vascular/Lymphatic: Aortic atherosclerosis. No enlarged abdominal or pelvic lymph nodes. Reproductive: No mass or other significant abnormality. Other: No abdominal wall hernia or abnormality. No abdominopelvic ascites. Musculoskeletal: No acute or significant osseous findings. IMPRESSION: 1. Unchanged postoperative/post treatment appearance of the pelvis, status post abdominoperineal resection with extensive soft tissue thickening throughout the pelvis and left lower quadrant end colostomy. 2. Multiple small bowel resection without evidence of obstruction or other acute findings at this time. 3. Mild bilateral hydronephrosis and hydroureter without obstructing etiology identified to the ureterovesicular junctions. Aortic Atherosclerosis (ICD10-I70.0). Electronically Signed   By: Delanna Ahmadi M.D.   On: 07/27/2021 13:17   US RENAL  Result Date: 07/28/2021 CLINICAL DATA:  Follow-up hydronephrosis EXAM: RENAL / URINARY TRACT ULTRASOUND COMPLETE COMPARISON:  07/27/2021 FINDINGS: Right Kidney: Renal measurements: 10.0 x 3.7 x 4.1 cm = volume: 80 mL. Increased renal parenchymal echogenicity. 1.0 cm upper pole cyst. No shadowing stone or hydronephrosis. Left Kidney: Renal measurements: 9.7 x 5.2 x 4.8 cm = volume: 127 mL. Increased renal parenchymal echogenicity. Mild hydronephrosis. No shadowing stone. No solid or cystic mass identified. Bladder: Decompressed by Foley catheter. Other: Small amount of free fluid is present. IMPRESSION: 1. Resolved right-sided hydronephrosis. Improved left-sided hydronephrosis, now mild. 2. Increased renal parenchymal echogenicity suggesting medical renal disease. 3. Small volume  ascites. Electronically Signed   By: Davina Poke D.O.   On: 07/28/2021 17:07   US RENAL  Result Date: 07/27/2021 CLINICAL DATA:  Acute kidney injury EXAM: RENAL / URINARY  TRACT ULTRASOUND COMPLETE COMPARISON:  CT 07/27/2021 FINDINGS: Right Kidney: Renal measurements: 10.1 x 4.9 x 3 cm = volume: 92.5 mL. Cortex is slightly echogenic. Moderate hydronephrosis. No mass. Left Kidney: Renal measurements: 8.6 x 5 4 x 4 cm = volume: 94.3 mL. Cortex appears slightly echogenic. Moderate hydronephrosis. No mass Bladder: Bladder appears slightly thick-walled. There is moderate debris in the bladder. Other: None. IMPRESSION: 1. Kidneys are slightly echogenic. There is moderate bilateral hydronephrosis. 2. Slightly thick-walled appearing urinary bladder with moderate debris Electronically Signed   By: Donavan Foil M.D.   On: 07/27/2021 19:11   DG Chest Port 1 View  Result Date: 07/27/2021 CLINICAL DATA:  dyspnea EXAM: PORTABLE CHEST 1 VIEW COMPARISON:  July 27, 2021. FINDINGS: Mild bibasilar predominant interstitial opacities, favored chronic. No confluent consolidation. No evidence of pleural effusions when correlating with same day CT abdomen/pelvis. Blunting of the right costophrenic sulcus, chronic and likely from scarring. No visible pneumothorax. Chronically prominent right paratracheal stripe, likely vascular. Cardiomediastinal silhouette is unchanged. Calcified breast implants. IMPRESSION: Mild bibasilar predominant interstitial opacities, favored chronic. No definite acute cardiopulmonary disease. Electronically Signed   By: Margaretha Sheffield M.D.   On: 07/27/2021 18:04   DG Chest Port 1 View  Result Date: 07/27/2021 CLINICAL DATA:  Shortness of breath, dizziness, blurred vision for 2 days. EXAM: PORTABLE CHEST 1 VIEW COMPARISON:  CT chest 06/10/2020 FINDINGS: Stable elevation of the right hemidiaphragm. Heart is normal in size. Lungs are clear. No pleural effusion or pneumothorax. Biapical  pleural thickening. No acute osseous abnormality. Breast implants. IMPRESSION: No acute cardiopulmonary abnormality. Electronically Signed   By: Ileana Roup M.D.   On: 07/27/2021 11:03    Assessment/Plan: Mild bilateral hydronephrosis: Resolving. Seen on CT A/P 07/27/2021. Confirmed on renal ultrasound 07/27/2021 in the setting of a distended bladder.  Foley catheter placed late evening of 07/27/2021.  Repeat renal ultrasound 10/27 with resolved right sided hydronephrosis and improved left sided hydronephrosis. Incomplete bladder emptying: Seen on CT 10/26 and RBUS 10/26. History of recurrent cystitis: Urinalysis 07/27/2021 with negative nitrites, large leukocyte esterase.  Afebrile, no leukocytosis.  Urine culture pending Foley catheter initially with sediment and debris however it is now draining clear yellow without purulence. Acute renal failure on CKD: Creatinine above 8 from baseline of 2 upon initial admission and has down trended to 7.86 at time of consultation.  Presumed due to ATN after receiving ciprofloxacin and naproxen.  -RBUS yesterday PM with resolved right hydro and only minimal left hydro after allowing foley catheter to decompress bladder. -No need for stent placement -Good UOP -Trend creatinine. Suspect ATN. -Following peripherally. -Will need f/u with [facility] with Dr. Rosario Adie.   LOS: 2 days   Matt R. Mabeline Varas MD 07/29/2021, 7:28 AM [facility]  Pager: [phone]

## 2021-07-28 NOTE — Progress Notes (Signed)
Critical/Abnormal Labs:  Potassium - 2.2 Calcium - 5.7 Magnesium - 1  eLink notified.   Lesle Reek, RN

## 2021-07-28 NOTE — Progress Notes (Signed)
Received consult for bilateral hydro, renal failure despite foley placement last night. Evidence of cloudy/sediment/purulent urine. Will take to OR this afternoon for cysto, b/l RPG, b/l ureteral stent placement. Posted to OR. Discussed with pt's RN. Made NPO. Full consult to follow.  Matt R. [facility]  Pager: [phone]

## 2021-07-28 NOTE — Plan of Care (Signed)
  Problem: Education: Goal: Knowledge of General Education information will improve Description: Including pain rating scale, medication(s)/side effects and non-pharmacologic comfort measures Outcome: Progressing   Problem: Clinical Measurements: Goal: Cardiovascular complication will be avoided Outcome: Progressing   Problem: Coping: Goal: Level of anxiety will decrease Outcome: Progressing   Problem: Pain Managment: Goal: General experience of comfort will improve Outcome: Progressing   

## 2021-07-28 NOTE — Progress Notes (Addendum)
Initial Nutrition Assessment  DOCUMENTATION CODES:   Severe malnutrition in context of chronic illness  INTERVENTION:   Recommend Regular diet after procedure, no restrictions d/t severe malnutrition. Chocolate Ensure Enlive po TID, each supplement provides 350 kcal and 20 grams of protein. MVI with minerals daily.  NUTRITION DIAGNOSIS:   Severe Malnutrition related to chronic illness (cancer) as evidenced by severe muscle depletion, severe fat depletion.  GOAL:   Patient will meet greater than or equal to 90% of their needs  MONITOR:   PO intake, Supplement acceptance, Labs  REASON FOR ASSESSMENT:   Rounds (Poor intake per RN)    ASSESSMENT:   75 yo female admitted with bilateral hydronephrosis. PMH includes anxiety, malignant anal melanoma, diverting colostomy, melanoma LUE, stroke, ovarian cancer at age 78, RLL lung mass s/p RLL lobectomy, B-12 deficiency.  Currently NPO with plans for bilateral stent placement today. Patient reports good intake at home up until a few days ago. She lost some weight is the spring, but has been maintaining that weight.   Weight history reviewed.  Patient weighed 52 kg in April 2021. Admission weight 46.7 kg. Current weight 56.1 kg  I/O +2.8 L since admission Colostomy output 150 ml x 24 hours, 250 ml so far today.  Labs reviewed. K 2.4, ionized calcium 0.82, creatinine 7.86, Hgb 6.5 CBG: 126-99-119  Medications reviewed. Received potassium, magnesium, and calcium supplementation overnight.  Patient meets criteria for severe malnutrition with severe depletion of muscle and subcutaneous fat mass.  Patient drinks PO supplements once daily at home; will order Ensure while in the hospital.   NUTRITION - FOCUSED PHYSICAL EXAM:  Flowsheet Row Most Recent Value  Orbital Region Moderate depletion  Upper Arm Region Severe depletion  Thoracic and Lumbar Region Severe depletion  Buccal Region Moderate depletion  Temple Region Mild  depletion  Clavicle Bone Region Severe depletion  Clavicle and Acromion Bone Region Severe depletion  Scapular Bone Region Severe depletion  Dorsal Hand Moderate depletion  Patellar Region Moderate depletion  Anterior Thigh Region Moderate depletion  Posterior Calf Region Mild depletion  Edema (RD Assessment) None  Hair Reviewed  Eyes Reviewed  Mouth Reviewed  Skin Reviewed  Nails Reviewed       Diet Order:   Diet Order             Diet NPO time specified  Diet effective now                   EDUCATION NEEDS:   Education needs have been addressed  Skin:  Skin Assessment: Reviewed RN Assessment  Last BM:  10/27 Colostomy  Height:   Ht Readings from Last 1 Encounters:  07/27/21 5\' 4"  (1.626 m)    Weight:   Wt Readings from Last 1 Encounters:  07/28/21 56.1 kg    BMI:  Body mass index is 21.23 kg/m.  Estimated Nutritional Needs:   Kcal:  1700-1900  Protein:  80-90 gm  Fluid:  >/= 1.5 L    Lucas Mallow, RD, LDN, CNSC Please refer to Amion for contact information.

## 2021-07-28 NOTE — Consult Note (Signed)
[place] Nurse ostomy consult note Patient receiving care in [place] and awake. Has had ostomy since 2020 Stoma type/location: LLQ colostomy Stomal assessment/size: Not assessed. Currently has opaque pouch on. States she changed it recently and it doesn't need to be changed this morning. Peristomal assessment: Per patient her skin is in good condition.  Treatment options for stomal/peristomal skin: Barrier ring Output; soft stool  Ostomy pouching: 2pc. 2 1/4" pouch Kellie Simmering # [phone]) Skin barrier Kellie Simmering # [phone]) with barrier ring Kellie Simmering # [id number]) and ostomy belt.  Education provided: None Enrolled patient in [place] program:No  2 piece pouching system     Thank you for the consult. [place] nurse will not follow at this time.   Please re-consult the [place] team if needed.  Cathlean Marseilles Tamala Julian, MSN, RN, [place], [place], [facility] Wound Treatment Associate Pager [phone]

## 2021-07-28 NOTE — Progress Notes (Signed)
NAME:  Desiree Adams, MRN:  [id number], DOB:  1945/12/25, LOS: 1 ADMISSION DATE:  07/27/2021, CONSULTATION DATE:  10/26 REFERRING MD:  Armandina Gemma, CHIEF COMPLAINT:  acute renal failure    History of Present Illness:  This is a 75 year old female w/ complex medical hx as outlined below. Presented to the ER 10/26 w/ cc: approx 2 d hx of: shortness of breath, dizziness, blurred vision, decreased appetite, decreased oral intake. This was approximately 3 days into treatment for cellulitis of the left hand for which she was treated w/ Cipro and naproxen. In er initial eval: CO2 < 7, BUN 86, cr 8.64, ph 6.9, Nephrology consulted in ER. Recommened Bicarb replacement w/ IVFs. CT abd ordered PCCM asked to admit.   CT A/P with bilateral hydronephrosis. Renal US with moderate bilateral hydro. Urology Consulted with plans for stent placement 10/27.    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 new foci of increased uptake, predominantly in the transverse and ascending colon.  Mildly FDG avid soft tissue surrounding the right breast implant.  Had Colonoscopy 10/3 healthy appearing mucosa.   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   Significant Hospital Events: Including procedures, antibiotic start and stop dates in addition to other pertinent events   10/26: Admitted w/ cc: weakness, dizziness, decreased appetite. initial eval: CO2 < 7, BUN 86, cr 8.64, ph 6.9, Nephrology consulted 10/27: urology  consulted for bilateral hydro  Interim History / Subjective:  Overnight with K and Mag replacement.   Objective   Blood pressure (!) 93/52, pulse 78, temperature 98.1 F (36.7 C), temperature source Oral, resp. rate 19, height 5\' 4"  (1.626 m), weight 56.1 kg, SpO2 100 %.        Intake/Output Summary (Last 24 hours) at 07/28/2021 0849 Last data filed at 07/28/2021 0700 Gross per 24 hour  Intake 2526.31 ml  Output 875 ml  Net 1651.31 ml    Filed Weights   07/27/21 1002 07/27/21 1551 07/28/21 0452  Weight: 46.7 kg 49.2 kg 56.1 kg    Examination: General: elderly female, resting in bed, no distress   HENT: Dry MM  Lungs: clear breath sounds, no use of accessory muscles  Cardiovascular: RRR  Abdomen: generalized tenderness, ostomy to LLQ Extremities: warm and dry  Neuro: alert, oriented, follows commands  GU: foley in place   Resolved Hospital Problem list    Assessment & Plan:   Acute on chronic renal failure w/ new bilateral hydronephrosis & hydroureter, history of recurrent UTI Metabolic acidosis (NAGMA) in setting of renal failure  H/O CKD stage IV  -Baseline Crt 2 Plan -Nephrology Following  -Continue hydration > changed from Bicarb to LR today   -Urology Consulted this AM with plans for cysto and bilateral stent placement  -Trend BMP   Hypokalemia Hypomagnesemia Plan -Replacing now  -Repeat BMP/Mag 1600   UTI H/O Recurrent UTI, cultures 05/2016 and 01/2018 with Klebsiella  Plan -Received Cefepime in ED, will start Rocephin while waiting for culture data  -Follow Culture Data -  Trend WBC and Fever Curve  -Plans for stents as above   Anemia of chronic disease -Hemoglobin baseline 11-12 currently 7, believe secondary to acute renal failure Plan  -Trend CBC -Repeat Hemoglobin at 1600  H/o cancer: melanoma (anal requiring diverting colostomy  and LUExt), ovarian cancer s/p TAH-BSO Plan F/u w/ onc as out-pt   Best Practice (right click and "Reselect all  SmartList Selections" daily)   Diet/type: Regular consistency (see orders) DVT prophylaxis: prophylactic heparin  GI prophylaxis: N/A Lines: N/A Foley:  Yes, and it is still needed Code Status:  full code Last date of multidisciplinary goals of care discussion [pending ]  Labs   CBC: Recent Labs  Lab 07/27/21 1053 07/27/21 1104 07/28/21 0046 07/28/21 0407  WBC 10.6*  --  6.8  --   NEUTROABS 8.8*  --   --   --   HGB 9.6* 10.2* 7.7* 6.5*  HCT 28.6* 30.0* 21.9* 19.0*  MCV 98.6  --  94.0  --   PLT 381  --  302  --      Basic Metabolic Panel: Recent Labs  Lab 07/27/21 1053 07/27/21 1104 07/27/21 1654 07/28/21 0046 07/28/21 0407 07/28/21 0727  NA 134* 137 140 138 139 136  K 4.0 4.1 2.8* 2.2* 2.1* 2.4*  CL 116*  --  117* 111  --  108  CO2 <7*  --  11* 14*  --  15*  GLUCOSE 126*  --  134* 126*  --  110*  BUN 86*  --  83* 81*  --  79*  CREATININE 8.64*  --  7.89* 8.17*  --  7.86*  CALCIUM 7.2*  --  6.5* 5.7*  --  5.4*  MG  --   --   --  1.0*  --   --   PHOS  --   --   --  6.8*  --   --     GFR: Estimated Creatinine Clearance: 5.4 mL/min (A) (by C-G formula based on SCr of 7.86 mg/dL (H)). Recent Labs  Lab 07/27/21 1053 07/27/21 1240 07/27/21 1654 07/28/21 0046  PROCALCITON  --   --  0.24  --   WBC 10.6*  --   --  6.8  LATICACIDVEN  --  0.7  --   --      Liver Function Tests: Recent Labs  Lab 07/27/21 1053  AST 14*  ALT 13  ALKPHOS 91  BILITOT 0.8  PROT 6.6  ALBUMIN 3.2*    No results for input(s): LIPASE, AMYLASE in the last 168 hours. No results for input(s): AMMONIA in the last 168 hours.  ABG    Component Value Date/Time   PHART 7.422 07/28/2021 0407   PCO2ART 23.9 (L) 07/28/2021 0407   PO2ART 100 07/28/2021 0407   HCO3 15.6 (L) 07/28/2021 0407   TCO2 16 (L) 07/28/2021 0407   ACIDBASEDEF 8.0 (H) 07/28/2021 0407   O2SAT 98.0 07/28/2021 0407      Coagulation Profile: Recent Labs  Lab 07/27/21 1053  INR 1.2     Cardiac  Enzymes: Recent Labs  Lab 07/27/21 1053  CKTOTAL 229    HbA1C: Hgb A1c MFr Bld  Date/Time Value Ref Range Status  10/27/2017 04:36 AM 5.2 4.8 - 5.6 % Final    Comment:    (NOTE) Pre diabetes:          5.7%-6.4% Diabetes:              >6.4% Glycemic control for   <7.0%  adults with diabetes     CBG: Recent Labs  Lab 07/27/21 1915 07/27/21 2315 07/28/21 0312 07/28/21 0725  GLUCAP 142* 124* 126* 99    Review of Systems:   Denies Nausea/Vomiting/SOB, +ABD tenderness    Critical care time: 32 min    Hayden Pedro, AGACNP-BC [facility]  PCCM Pgr: [phone]

## 2021-07-28 NOTE — Consult Note (Addendum)
Urology Consult   Physician requesting consult: Corliss Parish, MD  Reason for consult: Bilateral hydronephrosis, acute renal failure  History of Present Illness: Desiree Adams is a 75 y.o. 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   She follows with [facility] with a history of recurrent urinary tract infections. She was found to have cystitis in 05/2021 with enterobacter which was appropriately treated with ampicillin.  She did have a urinalysis in 05/2021 that demonstrated funguria and was given Diflucan. Upon reviewing those notes, she has a history of incomplete bladder emptying with elevated PVRs and incontinence.  She presented to the ED on 10/26 with complaints of SOB, decreased intake, dizziness, blurred vision that she attributed to cipro and naproxen use for discomfort of her hand. She was found to be hypotensive in acute renal fialure with creatinine over 8 from a baseline of 2. She initially responded well to a bolus of IVF and bicarb drip. She is having adequate UOP with 774ml recorded on 07/27/2021. CT A/P with mild bilateral hydroureteronephrosis to the ureterovesicular junctions with no obstruction identified. Renal ultrasound 07/27/2021 with slight echogenicity of kidneys with moderate bilateral hydronephrosis and moderate debris at dependent bladder base. A foley catheter was placed on the evening of 07/27/2021 after renal ultrasound was performed with good UOP recorded. Creatinine 10/27 was 7.86 from 8.17 prior. UA demonstrated neg nitrite, large LE. Ucx pending.   She remains fatigued and responds to questions in short answers. She denies any flank or abdominal pain. She is tolerating her catheter well which is draining clear yellow urine.  Past Medical History:  Diagnosis Date   Anxiety    Asthma    Biliary  colic    Carcinoid tumor, bronchus, lung, malignant ([place])    Difficult intubation    GERD (gastroesophageal reflux disease)    History of Clostridium difficile infection    Other and unspecified noninfectious gastroenteritis and colitis(558.9) 01/2001   nonspecific colitis most likely from radiation   Ovarian cancer ([place])    PONV (postoperative nausea and vomiting)    always nausea and  vomiting   Renal disorder    Right lower lobe lung mass    Small bowel obstruction (HCC)    Stroke (HCC)    Urinary tract infection    Vitamin B12 deficiency     Past Surgical History:  Procedure Laterality Date   APPENDECTOMY     BREAST ENHANCEMENT SURGERY     COLONOSCOPY  12/25/2011   Procedure: COLONOSCOPY;  Surgeon: Lafayette Dragon, MD;  Location: [facility];  Service: Endoscopy;  Laterality: N/A;   COLOSTOMY  2013   EXPLORATORY LAPAROTOMY W/ BOWEL RESECTION     HEMORRHOID SURGERY N/A 06/04/2013   Procedure: single coulum HEMORRHOIDECTOMY;  Surgeon: Odis Hollingshead, MD;  Location: [facility];  Service: General;  Laterality: N/A;   LAPAROSCOPIC CHOLECYSTECTOMY     LUNG REMOVAL, PARTIAL     rectum removal     TOTAL ABDOMINAL HYSTERECTOMY W/ BILATERAL SALPINGOOPHORECTOMY     VIDEO ASSISTED THORACOSCOPY  04/25/12   right     Current Hospital Medications:  Home meds:  No current facility-administered medications on file prior to encounter.   Current Outpatient Medications on File Prior to Encounter  Medication Sig Dispense Refill   cholestyramine (QUESTRAN) 4 g packet Take 1 packet by mouth 3 (  three) times daily.     ciprofloxacin (CIPRO) 500 MG tablet Take 500 mg by mouth 2 (two) times daily.     famotidine (PEPCID) 20 MG tablet Take 20 mg by mouth daily.     Multiple Vitamin (MULTI-VITAMIN PO) Take 5 mLs by mouth See admin instructions. Mix 5 mls multivitamin vegetable powder in water and drink     ondansetron (ZOFRAN ODT) 4 MG disintegrating tablet 4mg  ODT q4 hours prn nausea/vomit (Patient  taking differently: Take 4 mg by mouth every 4 (four) hours as needed for nausea or vomiting.) 30 tablet 0   polyvinyl alcohol (LIQUIFILM TEARS) 1.4 % ophthalmic solution Place 1 drop into both eyes at bedtime as needed for dry eyes.       Scheduled Meds:  Chlorhexidine Gluconate Cloth  6 each Topical Q0600   feeding supplement  237 mL Oral TID BM   heparin  5,000 Units Subcutaneous Q8H   multivitamin with minerals  1 tablet Oral Daily   Continuous Infusions:  cefTRIAXone (ROCEPHIN)  IV Stopped (07/28/21 1141)   lactated ringers 150 mL/hr at 07/28/21 1200   PRN Meds:.acetaminophen, docusate sodium, polyethylene glycol, white petrolatum  Allergies:  Allergies  Allergen Reactions   Other Hives    Reaction to hospital sheets ( pt is allergic to TIDE and [place])    Family History  Problem Relation Age of Onset   Stroke Mother    Rheum arthritis Mother    Hypertension Mother    Kidney cancer Other        ???? Grandfather   Bladder Cancer Maternal Grandfather    Stroke Maternal Grandmother    Healthy Son    Colon cancer Neg Hx    Esophageal cancer Neg Hx    Rectal cancer Neg Hx    Stomach cancer Neg Hx     Social History:  reports that she has never smoked. She has never used smokeless tobacco. She reports current alcohol use of about 1.0 standard drink per week. She reports that she does not use drugs.  ROS: A complete review of systems was performed.  All systems are negative except for pertinent findings as noted.  Physical Exam:  Vital signs in last 24 hours: Temp:  [97.4 F (36.3 C)-98.3 F (36.8 C)] 98.1 F (36.7 C) (10/27 1120) Pulse Rate:  [69-93] 70 (10/27 1200) Resp:  [14-31] 19 (10/27 1200) BP: (86-127)/(43-73) 97/55 (10/27 1200) SpO2:  [96 %-100 %] 100 % (10/27 1200) Weight:  [49.2 kg-56.1 kg] 56.1 kg (10/27 0452) Constitutional:  Alert and oriented, No acute distress Cardiovascular: Regular rate and rhythm Respiratory: Normal respiratory effort, Lungs  clear bilaterally GI: Abdomen is soft, nontender, nondistended, no abdominal masses GU: No CVA tenderness. Foley clear yellow Neurologic: Grossly intact, no focal deficits Psychiatric: Normal mood and affect  Laboratory Data:  Recent Labs    07/27/21 1053 07/27/21 1104 07/28/21 0046 07/28/21 0407  WBC 10.6*  --  6.8  --   HGB 9.6* 10.2* 7.7* 6.5*  HCT 28.6* 30.0* 21.9* 19.0*  PLT 381  --  302  --     Recent Labs    07/27/21 1053 07/27/21 1104 07/27/21 1654 07/28/21 0046 07/28/21 0407 07/28/21 0727  NA 134* 137 140 138 139 136  K 4.0 4.1 2.8* 2.2* 2.1* 2.4*  CL 116*  --  117* 111  --  108  GLUCOSE 126*  --  134* 126*  --  110*  BUN 86*  --  83* 81*  --  79*  CALCIUM 7.2*  --  6.5* 5.7*  --  5.4*  CREATININE 8.64*  --  7.89* 8.17*  --  7.86*     Results for orders placed or performed during the hospital encounter of 07/27/21 (from the past 24 hour(s))  MRSA Next Gen by PCR, Nasal     Status: None   Collection Time: 07/27/21  4:03 PM   Specimen: Nasal Mucosa; Nasal Swab  Result Value Ref Range   MRSA by PCR Next Gen NOT DETECTED NOT DETECTED  Basic metabolic panel     Status: Abnormal   Collection Time: 07/27/21  4:54 PM  Result Value Ref Range   Sodium 140 135 - 145 mmol/L   Potassium 2.8 (L) 3.5 - 5.1 mmol/L   Chloride 117 (H) 98 - 111 mmol/L   CO2 11 (L) 22 - 32 mmol/L   Glucose, Bld 134 (H) 70 - 99 mg/dL   BUN 83 (H) 8 - 23 mg/dL   Creatinine, Ser 7.89 (H) 0.44 - 1.00 mg/dL   Calcium 6.5 (L) 8.9 - 10.3 mg/dL   GFR, Estimated 5 (L) >60 mL/min   Anion gap 12 5 - 15  Procalcitonin     Status: None   Collection Time: 07/27/21  4:54 PM  Result Value Ref Range   Procalcitonin 0.24 ng/mL  Glucose, capillary     Status: Abnormal   Collection Time: 07/27/21  7:15 PM  Result Value Ref Range   Glucose-Capillary 142 (H) 70 - 99 mg/dL  Sodium, urine, random     Status: None   Collection Time: 07/27/21 10:28 PM  Result Value Ref Range   Sodium, Ur 100 mmol/L   Creatinine, urine, random     Status: None   Collection Time: 07/27/21 10:28 PM  Result Value Ref Range   Creatinine, Urine 33.49 mg/dL  Urinalysis, Complete w Microscopic     Status: Abnormal   Collection Time: 07/27/21 10:28 PM  Result Value Ref Range   Color, Urine YELLOW YELLOW   APPearance TURBID (A) CLEAR   Specific Gravity, Urine 1.009 1.005 - 1.030   pH 5.0 5.0 - 8.0   Glucose, UA NEGATIVE NEGATIVE mg/dL   Hgb urine dipstick MODERATE (A) NEGATIVE   Bilirubin Urine NEGATIVE NEGATIVE   Ketones, ur NEGATIVE NEGATIVE mg/dL   Protein, ur 100 (A) NEGATIVE mg/dL   Nitrite NEGATIVE NEGATIVE   Leukocytes,Ua LARGE (A) NEGATIVE   RBC / HPF 21-50 0 - 5 RBC/hpf   WBC, UA >50 (H) 0 - 5 WBC/hpf   Bacteria, UA FEW (A) NONE SEEN   WBC Clumps PRESENT    Budding Yeast PRESENT    Non Squamous Epithelial 0-5 (A) NONE SEEN  Glucose, capillary     Status: Abnormal   Collection Time: 07/27/21 11:15 PM  Result Value Ref Range   Glucose-Capillary 124 (H) 70 - 99 mg/dL  Basic metabolic panel     Status: Abnormal   Collection Time: 07/28/21 12:46 AM  Result Value Ref Range   Sodium 138 135 - 145 mmol/L   Potassium 2.2 (LL) 3.5 - 5.1 mmol/L   Chloride 111 98 - 111 mmol/L   CO2 14 (L) 22 - 32 mmol/L   Glucose, Bld 126 (H) 70 - 99 mg/dL   BUN 81 (H) 8 - 23 mg/dL   Creatinine, Ser 8.17 (H) 0.44 - 1.00 mg/dL   Calcium 5.7 (LL) 8.9 - 10.3 mg/dL   GFR, Estimated 5 (L) >60 mL/min   Anion gap 13  5 - 15  Magnesium     Status: Abnormal   Collection Time: 07/28/21 12:46 AM  Result Value Ref Range   Magnesium 1.0 (L) 1.7 - 2.4 mg/dL  CBC     Status: Abnormal   Collection Time: 07/28/21 12:46 AM  Result Value Ref Range   WBC 6.8 4.0 - 10.5 K/uL   RBC 2.33 (L) 3.87 - 5.11 MIL/uL   Hemoglobin 7.7 (L) 12.0 - 15.0 g/dL   HCT 21.9 (L) 36.0 - 46.0 %   MCV 94.0 80.0 - 100.0 fL   MCH 33.0 26.0 - 34.0 pg   MCHC 35.2 30.0 - 36.0 g/dL   RDW 13.5 11.5 - 15.5 %   Platelets 302 150 - 400 K/uL   nRBC 0.6  (H) 0.0 - 0.2 %  Phosphorus     Status: Abnormal   Collection Time: 07/28/21 12:46 AM  Result Value Ref Range   Phosphorus 6.8 (H) 2.5 - 4.6 mg/dL  Glucose, capillary     Status: Abnormal   Collection Time: 07/28/21  3:12 AM  Result Value Ref Range   Glucose-Capillary 126 (H) 70 - 99 mg/dL  I-STAT 7, (LYTES, BLD GAS, ICA, H+H)     Status: Abnormal   Collection Time: 07/28/21  4:07 AM  Result Value Ref Range   pH, Arterial 7.422 7.350 - 7.450   pCO2 arterial 23.9 (L) 32.0 - 48.0 mmHg   pO2, Arterial 100 83.0 - 108.0 mmHg   Bicarbonate 15.6 (L) 20.0 - 28.0 mmol/L   TCO2 16 (L) 22 - 32 mmol/L   O2 Saturation 98.0 %   Acid-base deficit 8.0 (H) 0.0 - 2.0 mmol/L   Sodium 139 135 - 145 mmol/L   Potassium 2.1 (LL) 3.5 - 5.1 mmol/L   Calcium, Ion 0.82 (LL) 1.15 - 1.40 mmol/L   HCT 19.0 (L) 36.0 - 46.0 %   Hemoglobin 6.5 (LL) 12.0 - 15.0 g/dL   Patient temperature 98.7 F    Collection site RADIAL, ALLEN'S TEST ACCEPTABLE    Drawn by Operator    Sample type ARTERIAL    Comment NOTIFIED PHYSICIAN   Glucose, capillary     Status: None   Collection Time: 07/28/21  7:25 AM  Result Value Ref Range   Glucose-Capillary 99 70 - 99 mg/dL  Basic metabolic panel     Status: Abnormal   Collection Time: 07/28/21  7:27 AM  Result Value Ref Range   Sodium 136 135 - 145 mmol/L   Potassium 2.4 (LL) 3.5 - 5.1 mmol/L   Chloride 108 98 - 111 mmol/L   CO2 15 (L) 22 - 32 mmol/L   Glucose, Bld 110 (H) 70 - 99 mg/dL   BUN 79 (H) 8 - 23 mg/dL   Creatinine, Ser 7.86 (H) 0.44 - 1.00 mg/dL   Calcium 5.4 (LL) 8.9 - 10.3 mg/dL   GFR, Estimated 5 (L) >60 mL/min   Anion gap 13 5 - 15  Glucose, capillary     Status: Abnormal   Collection Time: 07/28/21 11:19 AM  Result Value Ref Range   Glucose-Capillary 119 (H) 70 - 99 mg/dL  Glucose, capillary     Status: Abnormal   Collection Time: 07/28/21  3:11 PM  Result Value Ref Range   Glucose-Capillary 105 (H) 70 - 99 mg/dL   Recent Results (from the past 240  hour(s))  Resp Panel by RT-PCR (Flu A&B, Covid) Nasopharyngeal Swab     Status: None   Collection Time: 07/27/21 12:40 PM  Specimen: Nasopharyngeal Swab; Nasopharyngeal(NP) swabs in vial transport medium  Result Value Ref Range Status   SARS Coronavirus 2 by RT PCR NEGATIVE NEGATIVE Final    Comment: (NOTE) SARS-CoV-2 target nucleic acids are NOT DETECTED.  The SARS-CoV-2 RNA is generally detectable in upper respiratory specimens during the acute phase of infection. The lowest concentration of SARS-CoV-2 viral copies this assay can detect is 138 copies/mL. A negative result does not preclude SARS-Cov-2 infection and should not be used as the sole basis for treatment or other patient management decisions. A negative result may occur with  improper specimen collection/handling, submission of specimen other than nasopharyngeal swab, presence of viral mutation(s) within the areas targeted by this assay, and inadequate number of viral copies(<138 copies/mL). A negative result must be combined with clinical observations, patient history, and epidemiological information. The expected result is Negative.  Fact Sheet for Patients:  EntrepreneurPulse.com.au  Fact Sheet for Healthcare Providers:  IncredibleEmployment.be  This test is no t yet approved or cleared by the [place] FDA and  has been authorized for detection and/or diagnosis of SARS-CoV-2 by FDA under an Emergency Use Authorization (EUA). This EUA will remain  in effect (meaning this test can be used) for the duration of the COVID-19 declaration under Section 564(b)(1) of the Act, 21 U.S.C.section 360bbb-3(b)(1), unless the authorization is terminated  or revoked sooner.       Influenza A by PCR NEGATIVE NEGATIVE Final   Influenza B by PCR NEGATIVE NEGATIVE Final    Comment: (NOTE) The Xpert Xpress SARS-CoV-2/FLU/RSV plus assay is intended as an aid in the diagnosis of influenza from  Nasopharyngeal swab specimens and should not be used as a sole basis for treatment. Nasal washings and aspirates are unacceptable for Xpert Xpress SARS-CoV-2/FLU/RSV testing.  Fact Sheet for Patients: EntrepreneurPulse.com.au  Fact Sheet for Healthcare Providers: IncredibleEmployment.be  This test is not yet approved or cleared by the [place] FDA and has been authorized for detection and/or diagnosis of SARS-CoV-2 by FDA under an Emergency Use Authorization (EUA). This EUA will remain in effect (meaning this test can be used) for the duration of the COVID-19 declaration under Section 564(b)(1) of the Act, 21 U.S.C. section 360bbb-3(b)(1), unless the authorization is terminated or revoked.  Performed at [facility], [address] [place], [place] [address]   Blood Culture (routine x 2)     Status: None (Preliminary result)   Collection Time: 07/27/21 12:40 PM   Specimen: BLOOD LEFT WRIST  Result Value Ref Range Status   Specimen Description   Final    BLOOD LEFT WRIST Performed at [facility], [address] [place], [place] [address]    Special Requests   Final    BOTTLES DRAWN AEROBIC ONLY Blood Culture adequate volume Performed at [facility], [address] [place], [place] [address]    Culture   Final    NO GROWTH < 24 HOURS Performed at [facility], [place] [address], [place], [place] [address]    Report Status PENDING  Incomplete  MRSA Next Gen by PCR, Nasal     Status: None   Collection Time: 07/27/21  4:03 PM   Specimen: Nasal Mucosa; Nasal Swab  Result Value Ref Range Status   MRSA by PCR Next Gen NOT DETECTED NOT DETECTED Final    Comment: (NOTE) The GeneXpert MRSA Assay (FDA approved for NASAL specimens only), is one component of a comprehensive MRSA colonization surveillance  program. It is not intended to diagnose MRSA infection nor to guide or monitor treatment for MRSA  infections. Test performance is not FDA approved in patients less than 43 years old. Performed at [facility], [place] [address] [place], [place] [address]     Renal Function: Recent Labs    07/27/21 1053 07/27/21 1654 07/28/21 0046 07/28/21 0727  CREATININE 8.64* 7.89* 8.17* 7.86*   Estimated Creatinine Clearance: 5.4 mL/min (A) (by C-G formula based on SCr of 7.86 mg/dL (H)).  Radiologic Imaging: CT ABDOMEN PELVIS WO CONTRAST  Result Date: 07/27/2021 CLINICAL DATA:  Shortness of breath, dizziness, blurry vision, bowel obstruction suspected EXAM: CT ABDOMEN AND PELVIS WITHOUT CONTRAST TECHNIQUE: Multidetector CT imaging of the abdomen and pelvis was performed following the standard protocol without IV contrast. COMPARISON:  12/28/2018 FINDINGS: Lower chest: No acute abnormality. Hepatobiliary: No focal liver abnormality is seen. Status post cholecystectomy. No biliary dilatation. Pancreas: Unremarkable. No pancreatic ductal dilatation or surrounding inflammatory changes. Spleen: Normal in size without significant abnormality. Adrenals/Urinary Tract: Adrenal glands are unremarkable. Mild bilateral hydronephrosis and hydroureter without obstructing etiology identified to the ureterovesicular junctions. Stomach/Bowel: Stomach is within normal limits. Unchanged postoperative/post treatment appearance of the pelvis, status post abdominoperineal resection with extensive soft tissue thickening throughout the pelvis and left lower quadrant end colostomy. Multiple resection suture lines of the small bowel in the right hemiabdomen. Vascular/Lymphatic: Aortic atherosclerosis. No enlarged abdominal or pelvic lymph nodes. Reproductive: No mass or other significant abnormality. Other: No abdominal wall hernia or abnormality. No abdominopelvic ascites. Musculoskeletal: No acute or significant osseous findings. IMPRESSION: 1. Unchanged postoperative/post treatment appearance of the pelvis, status post  abdominoperineal resection with extensive soft tissue thickening throughout the pelvis and left lower quadrant end colostomy. 2. Multiple small bowel resection without evidence of obstruction or other acute findings at this time. 3. Mild bilateral hydronephrosis and hydroureter without obstructing etiology identified to the ureterovesicular junctions. Aortic Atherosclerosis (ICD10-I70.0). Electronically Signed   By: Delanna Ahmadi M.D.   On: 07/27/2021 13:17   US RENAL  Result Date: 07/27/2021 CLINICAL DATA:  Acute kidney injury EXAM: RENAL / URINARY TRACT ULTRASOUND COMPLETE COMPARISON:  CT 07/27/2021 FINDINGS: Right Kidney: Renal measurements: 10.1 x 4.9 x 3 cm = volume: 92.5 mL. Cortex is slightly echogenic. Moderate hydronephrosis. No mass. Left Kidney: Renal measurements: 8.6 x 5 4 x 4 cm = volume: 94.3 mL. Cortex appears slightly echogenic. Moderate hydronephrosis. No mass Bladder: Bladder appears slightly thick-walled. There is moderate debris in the bladder. Other: None. IMPRESSION: 1. Kidneys are slightly echogenic. There is moderate bilateral hydronephrosis. 2. Slightly thick-walled appearing urinary bladder with moderate debris Electronically Signed   By: Donavan Foil M.D.   On: 07/27/2021 19:11   DG Chest Port 1 View  Result Date: 07/27/2021 CLINICAL DATA:  dyspnea EXAM: PORTABLE CHEST 1 VIEW COMPARISON:  July 27, 2021. FINDINGS: Mild bibasilar predominant interstitial opacities, favored chronic. No confluent consolidation. No evidence of pleural effusions when correlating with same day CT abdomen/pelvis. Blunting of the right costophrenic sulcus, chronic and likely from scarring. No visible pneumothorax. Chronically prominent right paratracheal stripe, likely vascular. Cardiomediastinal silhouette is unchanged. Calcified breast implants. IMPRESSION: Mild bibasilar predominant interstitial opacities, favored chronic. No definite acute cardiopulmonary disease. Electronically Signed   By:  Margaretha Sheffield M.D.   On: 07/27/2021 18:04   DG Chest Port 1 View  Result Date: 07/27/2021 CLINICAL DATA:  Shortness of breath, dizziness, blurred vision for 2 days. EXAM: PORTABLE CHEST 1 VIEW COMPARISON:  CT chest 06/10/2020 FINDINGS: Stable elevation of the right hemidiaphragm. Heart is normal in size. Lungs are clear. No pleural effusion or pneumothorax. Biapical pleural thickening. No acute osseous abnormality. Breast implants. IMPRESSION: No acute cardiopulmonary abnormality. Electronically Signed   By: Ileana Roup M.D.   On: 07/27/2021 11:03    I independently reviewed the above imaging studies.  Impression/Recommendation: Mild bilateral hydronephrosis: Resolving. Seen on CT A/P 07/27/2021. Confirmed on renal ultrasound 07/27/2021 in the setting of a distended bladder.  Foley catheter placed late evening of 07/27/2021.  Repeat renal ultrasound 10/27 with resolved right sided hydronephrosis and improved left sided hydronephrosis. Incomplete bladder emptying: Seen on CT 10/26 and RBUS 10/26. History of recurrent cystitis: Urinalysis 07/27/2021 with negative nitrites, large leukocyte esterase.  Afebrile, no leukocytosis.  Urine culture pending Foley catheter initially with sediment and debris however it is now draining clear yellow without purulence. Acute renal failure on CKD: Creatinine above 8 from baseline of 2 upon initial admission and has down trended to 7.86 at time of consultation.  Presumed due to ATN after receiving ciprofloxacin and naproxen.  -Foley catheter was placed late last night after renal ultrasound was performed.  Repeat renal ultrasound 07/28/2021 around 5 PM demonstrated resolved right-sided hydronephrosis with improved left-sided hydronephrosis which is now mild.  Further, her creatinine is improving.  Now at 7.1 from 7.86. -I think that her hydronephrosis was transient, due to reflux and related to bladder distention. -No need for ureteral stenting presently. Leave  foley catheter to gravity and can void trial once creatinine nadirs -I suspect that her renal decline is due to ATN -F/u urine culture -She follows with [facility] and will need followup with Dr. Rosario Adie in [place]. Maree Ainley MD 07/28/2021, 3:13 PM  [facility]  Pager: [phone]   CC: Corliss Parish, MD

## 2021-07-28 NOTE — Progress Notes (Addendum)
Subjective:  BP has been low overnight-  labs have been showing that her potassium has been dropping-  at 2.1 this AM-  bicarb drip was stopped in favor of LR given her improved pH of 7.4-  had at least 725 of UOP-  crt has not really improved-  renal ultrasound confirms bilateral moderate hydronephrosis.  She is fatigued this AM-  nods to questions but not verbal   Objective Vital signs in last 24 hours: Vitals:   07/28/21 0445 07/28/21 0452 07/28/21 0500 07/28/21 0600  BP: (!) 95/56  (!) 87/43 (!) 95/44  Pulse: 84  83 80  Resp: 16  17 19   Temp:      TempSrc:      SpO2: 100%  100% 100%  Weight:  56.1 kg    Height:       Weight change:   Intake/Output Summary (Last 24 hours) at 07/28/2021 0659 Last data filed at 07/28/2021 0600 Gross per 24 hour  Intake 2312.52 ml  Output 725 ml  Net 1587.52 ml    Assessment/Plan: 75 year old WF with many medical issues-  she has A on CRF in the setting of cipro ? toxicity and NSAIDs, possibly some ATN as well  1.Renal- A on CRF in the setting of above-  baseline crt in the low 2's but last measurement I have is 8 mos ago-  followed as an OP by [facility] nephrology -  I think could have some ATN from low BP-- placed foley with very purulent looking urine-  culture pending -  renal imaging by ultrasound and CT both showing hydronephrosis and debris in bladder-  I would get urology opinion   ( I have sent message thru amion) because even though she is making urine her crt is not improving at all-  could she need more intervention ? No current indications for dialysis  2. Non anion gap metabolic acidosis- possibly due to renal failure -  or gi output loss from ostomy.  was on bicarb drip-  now on LR due to low K-  bicarb and pH improved 3. Hypertension/volume  - not wet-  I am Ok with hydration with LR-  will give another NS bolus today  4.  Hypokalemia-  only has received 20 Po-  getting 40 IV today-  have written for 40 BID PO and also on LR now-   monitor closely -  also giving 2 grams of magnesium-  calcium is low as well-  replete 4. Anemia  - not extreme initially -  now dropping with IVF-  likely to give blood today  5. ID-  purulent urine-  on cefepime-  blood cultures and urine culture pending-  low BP looking like septic picture    Louis Meckel    Labs: Basic Metabolic Panel: Recent Labs  Lab 07/27/21 1053 07/27/21 1104 07/27/21 1654 07/28/21 0046 07/28/21 0407  NA 134*   < > 140 138 139  K 4.0   < > 2.8* 2.2* 2.1*  CL 116*  --  117* 111  --   CO2 <7*  --  11* 14*  --   GLUCOSE 126*  --  134* 126*  --   BUN 86*  --  83* 81*  --   CREATININE 8.64*  --  7.89* 8.17*  --   CALCIUM 7.2*  --  6.5* 5.7*  --   PHOS  --   --   --  6.8*  --    < > =  values in this interval not displayed.   Liver Function Tests: Recent Labs  Lab 07/27/21 1053  AST 14*  ALT 13  ALKPHOS 91  BILITOT 0.8  PROT 6.6  ALBUMIN 3.2*   No results for input(s): LIPASE, AMYLASE in the last 168 hours. No results for input(s): AMMONIA in the last 168 hours. CBC: Recent Labs  Lab 07/27/21 1053 07/27/21 1104 07/28/21 0046 07/28/21 0407  WBC 10.6*  --  6.8  --   NEUTROABS 8.8*  --   --   --   HGB 9.6* 10.2* 7.7* 6.5*  HCT 28.6* 30.0* 21.9* 19.0*  MCV 98.6  --  94.0  --   PLT 381  --  302  --    Cardiac Enzymes: Recent Labs  Lab 07/27/21 1053  CKTOTAL 229   CBG: Recent Labs  Lab 07/27/21 1915 07/27/21 2315 07/28/21 0312  GLUCAP 142* 124* 126*    Iron Studies: No results for input(s): IRON, TIBC, TRANSFERRIN, FERRITIN in the last 72 hours. Studies/Results: CT ABDOMEN PELVIS WO CONTRAST  Result Date: 07/27/2021 CLINICAL DATA:  Shortness of breath, dizziness, blurry vision, bowel obstruction suspected EXAM: CT ABDOMEN AND PELVIS WITHOUT CONTRAST TECHNIQUE: Multidetector CT imaging of the abdomen and pelvis was performed following the standard protocol without IV contrast. COMPARISON:  12/28/2018 FINDINGS: Lower chest:  No acute abnormality. Hepatobiliary: No focal liver abnormality is seen. Status post cholecystectomy. No biliary dilatation. Pancreas: Unremarkable. No pancreatic ductal dilatation or surrounding inflammatory changes. Spleen: Normal in size without significant abnormality. Adrenals/Urinary Tract: Adrenal glands are unremarkable. Mild bilateral hydronephrosis and hydroureter without obstructing etiology identified to the ureterovesicular junctions. Stomach/Bowel: Stomach is within normal limits. Unchanged postoperative/post treatment appearance of the pelvis, status post abdominoperineal resection with extensive soft tissue thickening throughout the pelvis and left lower quadrant end colostomy. Multiple resection suture lines of the small bowel in the right hemiabdomen. Vascular/Lymphatic: Aortic atherosclerosis. No enlarged abdominal or pelvic lymph nodes. Reproductive: No mass or other significant abnormality. Other: No abdominal wall hernia or abnormality. No abdominopelvic ascites. Musculoskeletal: No acute or significant osseous findings. IMPRESSION: 1. Unchanged postoperative/post treatment appearance of the pelvis, status post abdominoperineal resection with extensive soft tissue thickening throughout the pelvis and left lower quadrant end colostomy. 2. Multiple small bowel resection without evidence of obstruction or other acute findings at this time. 3. Mild bilateral hydronephrosis and hydroureter without obstructing etiology identified to the ureterovesicular junctions. Aortic Atherosclerosis (ICD10-I70.0). Electronically Signed   By: Delanna Ahmadi M.D.   On: 07/27/2021 13:17   US RENAL  Result Date: 07/27/2021 CLINICAL DATA:  Acute kidney injury EXAM: RENAL / URINARY TRACT ULTRASOUND COMPLETE COMPARISON:  CT 07/27/2021 FINDINGS: Right Kidney: Renal measurements: 10.1 x 4.9 x 3 cm = volume: 92.5 mL. Cortex is slightly echogenic. Moderate hydronephrosis. No mass. Left Kidney: Renal measurements: 8.6 x  5 4 x 4 cm = volume: 94.3 mL. Cortex appears slightly echogenic. Moderate hydronephrosis. No mass Bladder: Bladder appears slightly thick-walled. There is moderate debris in the bladder. Other: None. IMPRESSION: 1. Kidneys are slightly echogenic. There is moderate bilateral hydronephrosis. 2. Slightly thick-walled appearing urinary bladder with moderate debris Electronically Signed   By: Donavan Foil M.D.   On: 07/27/2021 19:11   DG Chest Port 1 View  Result Date: 07/27/2021 CLINICAL DATA:  dyspnea EXAM: PORTABLE CHEST 1 VIEW COMPARISON:  July 27, 2021. FINDINGS: Mild bibasilar predominant interstitial opacities, favored chronic. No confluent consolidation. No evidence of pleural effusions when correlating with same day CT abdomen/pelvis. Blunting  of the right costophrenic sulcus, chronic and likely from scarring. No visible pneumothorax. Chronically prominent right paratracheal stripe, likely vascular. Cardiomediastinal silhouette is unchanged. Calcified breast implants. IMPRESSION: Mild bibasilar predominant interstitial opacities, favored chronic. No definite acute cardiopulmonary disease. Electronically Signed   By: Margaretha Sheffield M.D.   On: 07/27/2021 18:04   DG Chest Port 1 View  Result Date: 07/27/2021 CLINICAL DATA:  Shortness of breath, dizziness, blurred vision for 2 days. EXAM: PORTABLE CHEST 1 VIEW COMPARISON:  CT chest 06/10/2020 FINDINGS: Stable elevation of the right hemidiaphragm. Heart is normal in size. Lungs are clear. No pleural effusion or pneumothorax. Biapical pleural thickening. No acute osseous abnormality. Breast implants. IMPRESSION: No acute cardiopulmonary abnormality. Electronically Signed   By: Ileana Roup M.D.   On: 07/27/2021 11:03   Medications: Infusions:  lactated ringers 150 mL/hr at 07/28/21 0600   potassium chloride 10 mEq (07/28/21 8416)    Scheduled Medications:  Chlorhexidine Gluconate Cloth  6 each Topical Q0600   heparin  5,000 Units Subcutaneous  Q8H    have reviewed scheduled and prn medications.  Physical Exam: General: somnolent this AM-  but nods that she is OK Heart: RRR Lungs: mostly clear Abdomen: thin, soft, minimal tenderness Extremities: no edema   07/28/2021,6:59 AM  LOS: 1 day

## 2021-07-29 DIAGNOSIS — N179 Acute kidney failure, unspecified: Secondary | ICD-10-CM | POA: Diagnosis not present

## 2021-07-29 LAB — BASIC METABOLIC PANEL
Anion gap: 12 (ref 5–15)
Anion gap: 9 (ref 5–15)
Anion gap: 11 (ref 5–15)
BUN: 67 mg/dL — ABNORMAL HIGH (ref 8–23)
BUN: 62 mg/dL — ABNORMAL HIGH (ref 8–23)
BUN: 62 mg/dL — ABNORMAL HIGH (ref 8–23)
CO2: 13 mmol/L — ABNORMAL LOW (ref 22–32)
CO2: 15 mmol/L — ABNORMAL LOW (ref 22–32)
CO2: 11 mmol/L — ABNORMAL LOW (ref 22–32)
Calcium: 6.4 mg/dL — CL (ref 8.9–10.3)
Calcium: 7.4 mg/dL — ABNORMAL LOW (ref 8.9–10.3)
Calcium: 6.9 mg/dL — ABNORMAL LOW (ref 8.9–10.3)
Chloride: 113 mmol/L — ABNORMAL HIGH (ref 98–111)
Chloride: 113 mmol/L — ABNORMAL HIGH (ref 98–111)
Chloride: 109 mmol/L (ref 98–111)
Creatinine, Ser: 6.56 mg/dL — ABNORMAL HIGH (ref 0.44–1.00)
Creatinine, Ser: 6.4 mg/dL — ABNORMAL HIGH (ref 0.44–1.00)
Creatinine, Ser: 6.22 mg/dL — ABNORMAL HIGH (ref 0.44–1.00)
GFR, Estimated: 6 mL/min — ABNORMAL LOW (ref >60)
GFR, Estimated: 6 mL/min — ABNORMAL LOW (ref >60)
GFR, Estimated: 7 mL/min — ABNORMAL LOW (ref >60)
Glucose, Bld: 118 mg/dL — ABNORMAL HIGH (ref 70–99)
Glucose, Bld: 141 mg/dL — ABNORMAL HIGH (ref 70–99)
Glucose, Bld: 163 mg/dL — ABNORMAL HIGH (ref 70–99)
Potassium: 3.6 mmol/L (ref 3.5–5.1)
Potassium: 4.1 mmol/L (ref 3.5–5.1)
Potassium: 3.8 mmol/L (ref 3.5–5.1)
Sodium: 138 mmol/L (ref 135–145)
Sodium: 137 mmol/L (ref 135–145)
Sodium: 131 mmol/L — ABNORMAL LOW (ref 135–145)

## 2021-07-29 LAB — CBC
HCT: 20.8 % — ABNORMAL LOW (ref 36.0–46.0)
Hemoglobin: 7.1 g/dL — ABNORMAL LOW (ref 12.0–15.0)
MCH: 32.4 pg (ref 26.0–34.0)
MCHC: 34.1 g/dL (ref 30.0–36.0)
MCV: 95 fL (ref 80.0–100.0)
Platelets: 278 10*3/uL (ref 150–400)
RBC: 2.19 MIL/uL — ABNORMAL LOW (ref 3.87–5.11)
RDW: 14.2 % (ref 11.5–15.5)
WBC: 8.1 10*3/uL (ref 4.0–10.5)
nRBC: 0.2 % (ref 0.0–0.2)

## 2021-07-29 LAB — PHOSPHORUS: Phosphorus: 5.2 mg/dL — ABNORMAL HIGH (ref 2.5–4.6)

## 2021-07-29 LAB — MAGNESIUM: Magnesium: 2 mg/dL (ref 1.7–2.4)

## 2021-07-29 MED ORDER — POTASSIUM CHLORIDE CRYS ER 20 MEQ PO TBCR
40.0000 meq | EXTENDED_RELEASE_TABLET | Freq: Once | ORAL | Status: AC
  Administered 2021-07-29: 40 meq via ORAL
  Filled 2021-07-29: qty 2

## 2021-07-29 MED ORDER — CALCIUM GLUCONATE-NACL 2-0.675 GM/100ML-% IV SOLN
2.0000 g | Freq: Once | INTRAVENOUS | Status: AC
  Administered 2021-07-29: 2000 mg via INTRAVENOUS
  Filled 2021-07-29: qty 100

## 2021-07-29 MED ORDER — ONDANSETRON HCL 4 MG/2ML IJ SOLN
4.0000 mg | Freq: Once | INTRAMUSCULAR | Status: AC
  Administered 2021-07-29: 4 mg via INTRAVENOUS
  Filled 2021-07-29: qty 2

## 2021-07-29 MED ORDER — SODIUM BICARBONATE 650 MG PO TABS
1300.0000 mg | ORAL_TABLET | Freq: Three times a day (TID) | ORAL | Status: DC
  Administered 2021-07-29 – 2021-07-31 (×5): 1300 mg via ORAL
  Filled 2021-07-29 (×5): qty 2

## 2021-07-29 MED ORDER — ONDANSETRON HCL 4 MG/2ML IJ SOLN
4.0000 mg | Freq: Four times a day (QID) | INTRAMUSCULAR | Status: AC | PRN
  Administered 2021-07-29 – 2021-07-31 (×2): 4 mg via INTRAVENOUS
  Filled 2021-07-29 (×2): qty 2

## 2021-07-29 MED ORDER — NEPRO/CARBSTEADY PO LIQD
237.0000 mL | Freq: Two times a day (BID) | ORAL | Status: DC
  Administered 2021-07-29 – 2021-07-31 (×4): 237 mL via ORAL

## 2021-07-29 MED ORDER — SODIUM BICARBONATE 650 MG PO TABS
1300.0000 mg | ORAL_TABLET | Freq: Three times a day (TID) | ORAL | Status: DC
  Filled 2021-07-29: qty 2

## 2021-07-29 MED ORDER — SODIUM CHLORIDE 0.9 % IV SOLN: INTRAVENOUS | Status: DC

## 2021-07-29 MED ORDER — POTASSIUM CHLORIDE 10 MEQ/100ML IV SOLN: 10.0000 meq | INTRAVENOUS | Status: DC

## 2021-07-29 MED ORDER — STERILE WATER FOR INJECTION IV SOLN
INTRAVENOUS | Status: DC
  Filled 2021-07-29 (×3): qty 1000

## 2021-07-29 NOTE — Progress Notes (Signed)
[facility] Progress Note Patient Name: Desiree Adams DOB: [birth year] MRN: [id number]   Date of Service  07/29/2021  HPI/Events of Note  Patient with recurrence of Nausea and vomiting.  eICU Interventions  PRN Zofran ordered.        Kerry Kass Latif Nazareno 07/29/2021, 5:58 AM

## 2021-07-29 NOTE — Progress Notes (Signed)
Nutrition Follow-up  DOCUMENTATION CODES:   Severe malnutrition in context of chronic illness  INTERVENTION:  -d/c ensure  -Nepro Shake po BID, each supplement provides 425 kcal and 19 grams protein -liberalized to regular diet to encourage po intake -continue MVI with minerals daily  NUTRITION DIAGNOSIS:   Severe Malnutrition related to chronic illness (cancer) as evidenced by severe muscle depletion, severe fat depletion.  ongoing  GOAL:   Patient will meet greater than or equal to 90% of their needs  progressing  MONITOR:   PO intake, Supplement acceptance, Labs  REASON FOR ASSESSMENT:   Rounds (Poor intake per RN)    ASSESSMENT:   75 yo female admitted with bilateral hydronephrosis. PMH includes anxiety, malignant anal melanoma, diverting colostomy, melanoma LUE, stroke, ovarian cancer at age 89, RLL lung mass s/p RLL lobectomy, B-12 deficiency.  RN reached out to RD to discuss transitioning pt from Ensure to Nepro per MD request. Discussed increased nutrient needs for pt and liberalizing diet to regular to encourage po intake as pt has had minimal intake of renal restricted diet (pt reports that she hates the hospital food). Plan to monitor adequacy of po intake and adjust regimen as necessary. Given pt's intake has been poor, it is unlikely that food/supplement choices have been contributing to her electrolyte abnormalities. That being said, Nepro does provide more calories and protein than Ensure while also being more limited in electrolytes so RD agreeable to transition provided pt is accepting of supplement. If pt refuses Nepro, will transition back to Ensure as it is more important that pt adequately meet her kcal/protein needs. Additionally, discussed use of binders with MD to allow for more liberalized diet while also working towards normalizing electrolyte levels. MD in agreement with plan.   No PO intake documented.   UOP: 1374ml x24 hours Colostomy output:  89ml x24 hours I/O: +4071ml since admit  Medications: mvi with minerals, sodium bicarbonate, IV abx Labs: Na 131 (L), BUN 62 (H), Cr 6.22 (H)  Diet Order:   Diet Order             Diet regular Room service appropriate? Yes; Fluid consistency: Thin  Diet effective now                   EDUCATION NEEDS:   Education needs have been addressed  Skin:  Skin Assessment: Reviewed RN Assessment  Last BM:  10/27 colostomy  Height:   Ht Readings from Last 1 Encounters:  07/27/21 5\' 4"  (1.626 m)    Weight:   Wt Readings from Last 1 Encounters:  07/29/21 51.5 kg     BMI:  Body mass index is 19.49 kg/m.  Estimated Nutritional Needs:   Kcal:  1700-1900  Protein:  80-90 gm  Fluid:  >/= 1.5 L    Desiree Ina, MS, RD, LDN (she/her/hers) RD pager number and weekend/on-call pager number located in [place].

## 2021-07-29 NOTE — Progress Notes (Signed)
Subjective:   UOP 1.3L yesterday, urology managing with foley - no stent needed.   She reports no issues this morning but is quietly resting in bed and not speaking much.   Objective Vital signs in last 24 hours: Vitals:   07/29/21 0330 07/29/21 0400 07/29/21 0430 07/29/21 0500  BP: 120/61 123/70 115/63 126/64  Pulse: 77 81 79 78  Resp: 16 18 18 19   Temp:      TempSrc:      SpO2: 100% 100% 99% 99%  Weight:    51.5 kg  Height:       Weight change: 4.779 kg  Intake/Output Summary (Last 24 hours) at 07/29/2021 0651 Last data filed at 07/29/2021 0427 Gross per 24 hour  Intake 4704.37 ml  Output 2172 ml  Net 2532.37 ml     Assessment/Plan: 75 year old WF with many medical issues-  she has A on CRF in the setting of cipro ? toxicity and NSAIDs, ATN in setting of sepsis 1.AKI on CKD 4 -  baseline Cr ~2.3 now with severe AKI in setting of NSAIDs, possible ATN, obstruction now s/p foley.  Follows with [facility] nephrology outpt.  Urology consulted and agree with foley decompression and rec outpt urology f/u - no stent or further intervention needed. Cr slowly improving to 6.6 today from max 8.6 on admission.  She's nonoliguric and no current indications for dialysis but will follow closely.  2. Non anion gap metabolic acidosis- AKI + GI losses.  Persistent, start po bicarb 1300 TID.  Certainly could resume bicarb gtt in the short term if she needs to remain on IV fluids through today.  3. Hypertension/volume  - Currently on MIVF with 0.9% NS with no e/o volume overload and improving UOP.  BPs now normal. 4.  Hypokalemia-  improved to 3.6 today, continue oral supplement PRN.  4. Anemia  - Hb trended down, being transfused per Primary. 5. ID-  purulent urine initially - looks normal in bag now - was on cefepime and now CTX-  blood cultures NGTD and urine culture with multiple species present-  antibiotics per primary 6. Hypocalcemia: being repleted.    Justin Mend    Labs: Basic Metabolic  Panel: Recent Labs  Lab 07/28/21 0046 07/28/21 0407 07/28/21 0727 07/28/21 1606 07/29/21 0013  NA 138   < > 136 137 138  K 2.2*   < > 2.4* 3.3* 3.6  CL 111  --  108 110 113*  CO2 14*  --  15* 16* 13*  GLUCOSE 126*  --  110* 118* 118*  BUN 81*  --  79* 70* 67*  CREATININE 8.17*  --  7.86* 7.10* 6.56*  CALCIUM 5.7*  --  5.4* 6.0* 6.4*  PHOS 6.8*  --   --   --  5.2*   < > = values in this interval not displayed.    Liver Function Tests: Recent Labs  Lab 07/27/21 1053  AST 14*  ALT 13  ALKPHOS 91  BILITOT 0.8  PROT 6.6  ALBUMIN 3.2*    No results for input(s): LIPASE, AMYLASE in the last 168 hours. No results for input(s): AMMONIA in the last 168 hours. CBC: Recent Labs  Lab 07/27/21 1053 07/27/21 1104 07/28/21 0046 07/28/21 0407 07/28/21 1606 07/29/21 0013  WBC 10.6*  --  6.8  --  8.6 8.1  NEUTROABS 8.8*  --   --   --   --   --   HGB 9.6*   < >  7.7* 6.5* 7.1* 7.1*  HCT 28.6*   < > 21.9* 19.0* 20.8* 20.8*  MCV 98.6  --  94.0  --  95.0 95.0  PLT 381  --  302  --  279 278   < > = values in this interval not displayed.    Cardiac Enzymes: Recent Labs  Lab 07/27/21 1053  CKTOTAL 229    CBG: Recent Labs  Lab 07/28/21 0725 07/28/21 1119 07/28/21 1511 07/28/21 1914 07/28/21 2346  GLUCAP 99 119* 105* 107* 123*     Iron Studies: No results for input(s): IRON, TIBC, TRANSFERRIN, FERRITIN in the last 72 hours. Studies/Results: CT ABDOMEN PELVIS WO CONTRAST  Result Date: 07/27/2021 CLINICAL DATA:  Shortness of breath, dizziness, blurry vision, bowel obstruction suspected EXAM: CT ABDOMEN AND PELVIS WITHOUT CONTRAST TECHNIQUE: Multidetector CT imaging of the abdomen and pelvis was performed following the standard protocol without IV contrast. COMPARISON:  12/28/2018 FINDINGS: Lower chest: No acute abnormality. Hepatobiliary: No focal liver abnormality is seen. Status post cholecystectomy. No biliary dilatation. Pancreas: Unremarkable. No pancreatic ductal  dilatation or surrounding inflammatory changes. Spleen: Normal in size without significant abnormality. Adrenals/Urinary Tract: Adrenal glands are unremarkable. Mild bilateral hydronephrosis and hydroureter without obstructing etiology identified to the ureterovesicular junctions. Stomach/Bowel: Stomach is within normal limits. Unchanged postoperative/post treatment appearance of the pelvis, status post abdominoperineal resection with extensive soft tissue thickening throughout the pelvis and left lower quadrant end colostomy. Multiple resection suture lines of the small bowel in the right hemiabdomen. Vascular/Lymphatic: Aortic atherosclerosis. No enlarged abdominal or pelvic lymph nodes. Reproductive: No mass or other significant abnormality. Other: No abdominal wall hernia or abnormality. No abdominopelvic ascites. Musculoskeletal: No acute or significant osseous findings. IMPRESSION: 1. Unchanged postoperative/post treatment appearance of the pelvis, status post abdominoperineal resection with extensive soft tissue thickening throughout the pelvis and left lower quadrant end colostomy. 2. Multiple small bowel resection without evidence of obstruction or other acute findings at this time. 3. Mild bilateral hydronephrosis and hydroureter without obstructing etiology identified to the ureterovesicular junctions. Aortic Atherosclerosis (ICD10-I70.0). Electronically Signed   By: Delanna Ahmadi M.D.   On: 07/27/2021 13:17   US RENAL  Result Date: 07/28/2021 CLINICAL DATA:  Follow-up hydronephrosis EXAM: RENAL / URINARY TRACT ULTRASOUND COMPLETE COMPARISON:  07/27/2021 FINDINGS: Right Kidney: Renal measurements: 10.0 x 3.7 x 4.1 cm = volume: 80 mL. Increased renal parenchymal echogenicity. 1.0 cm upper pole cyst. No shadowing stone or hydronephrosis. Left Kidney: Renal measurements: 9.7 x 5.2 x 4.8 cm = volume: 127 mL. Increased renal parenchymal echogenicity. Mild hydronephrosis. No shadowing stone. No solid or  cystic mass identified. Bladder: Decompressed by Foley catheter. Other: Small amount of free fluid is present. IMPRESSION: 1. Resolved right-sided hydronephrosis. Improved left-sided hydronephrosis, now mild. 2. Increased renal parenchymal echogenicity suggesting medical renal disease. 3. Small volume ascites. Electronically Signed   By: Davina Poke D.O.   On: 07/28/2021 17:07   US RENAL  Result Date: 07/27/2021 CLINICAL DATA:  Acute kidney injury EXAM: RENAL / URINARY TRACT ULTRASOUND COMPLETE COMPARISON:  CT 07/27/2021 FINDINGS: Right Kidney: Renal measurements: 10.1 x 4.9 x 3 cm = volume: 92.5 mL. Cortex is slightly echogenic. Moderate hydronephrosis. No mass. Left Kidney: Renal measurements: 8.6 x 5 4 x 4 cm = volume: 94.3 mL. Cortex appears slightly echogenic. Moderate hydronephrosis. No mass Bladder: Bladder appears slightly thick-walled. There is moderate debris in the bladder. Other: None. IMPRESSION: 1. Kidneys are slightly echogenic. There is moderate bilateral hydronephrosis. 2. Slightly thick-walled appearing urinary bladder with  moderate debris Electronically Signed   By: Donavan Foil M.D.   On: 07/27/2021 19:11   DG Chest Port 1 View  Result Date: 07/27/2021 CLINICAL DATA:  dyspnea EXAM: PORTABLE CHEST 1 VIEW COMPARISON:  July 27, 2021. FINDINGS: Mild bibasilar predominant interstitial opacities, favored chronic. No confluent consolidation. No evidence of pleural effusions when correlating with same day CT abdomen/pelvis. Blunting of the right costophrenic sulcus, chronic and likely from scarring. No visible pneumothorax. Chronically prominent right paratracheal stripe, likely vascular. Cardiomediastinal silhouette is unchanged. Calcified breast implants. IMPRESSION: Mild bibasilar predominant interstitial opacities, favored chronic. No definite acute cardiopulmonary disease. Electronically Signed   By: Margaretha Sheffield M.D.   On: 07/27/2021 18:04   DG Chest Port 1 View  Result  Date: 07/27/2021 CLINICAL DATA:  Shortness of breath, dizziness, blurred vision for 2 days. EXAM: PORTABLE CHEST 1 VIEW COMPARISON:  CT chest 06/10/2020 FINDINGS: Stable elevation of the right hemidiaphragm. Heart is normal in size. Lungs are clear. No pleural effusion or pneumothorax. Biapical pleural thickening. No acute osseous abnormality. Breast implants. IMPRESSION: No acute cardiopulmonary abnormality. Electronically Signed   By: Ileana Roup M.D.   On: 07/27/2021 11:03   Medications: Infusions:  sodium chloride Stopped (07/29/21 0421)   cefTRIAXone (ROCEPHIN)  IV Stopped (07/28/21 1141)    Scheduled Medications:  Chlorhexidine Gluconate Cloth  6 each Topical Q0600   feeding supplement  237 mL Oral TID BM   heparin  5,000 Units Subcutaneous Q8H   multivitamin with minerals  1 tablet Oral Daily    have reviewed scheduled and prn medications.  Physical Exam: General: sleepy but appears comfortable Heart: RRR Lungs: mostly clear Abdomen: thin, soft, minimal tenderness Extremities: no edema   07/29/2021,6:51 AM  LOS: 2 days

## 2021-07-29 NOTE — Progress Notes (Signed)
Patient has had two vomiting episodes since last recorded one. One at [place] and 0650. No further concerns.

## 2021-07-29 NOTE — Progress Notes (Signed)
R PIV infiltrated sodium bicarbonate. IV removed. Site is red and swollen, patient has no complaints of pain or tenderness. Dr. Silas Flood made aware.

## 2021-07-29 NOTE — Progress Notes (Signed)
NAME:  Desiree Adams, MRN:  [id number], DOB:  [birth year], LOS: 2 ADMISSION DATE:  07/27/2021, CONSULTATION DATE:  10/26 REFERRING MD:  Armandina Gemma, CHIEF COMPLAINT:  acute renal failure    History of Present Illness:  This is a 75 year old female w/ complex medical hx as outlined below. Presented to the ER 10/26 w/ cc: approx 2 d hx of: shortness of breath, dizziness, blurred vision, decreased appetite, decreased oral intake. This was approximately 3 days into treatment for cellulitis of the left hand for which she was treated w/ Cipro and naproxen. In er initial eval: CO2 < 7, BUN 86, cr 8.64, ph 6.9, Nephrology consulted in ER. Recommened Bicarb replacement w/ IVFs. CT abd ordered PCCM asked to admit.   CT A/P with bilateral hydronephrosis. Renal US with moderate bilateral hydro. Urology Consulted with plans for stent placement 10/27.    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 new foci of increased uptake, predominantly in the transverse and ascending colon.  Mildly FDG avid soft tissue surrounding the right breast implant.  Had Colonoscopy 10/3 healthy appearing mucosa.   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   Significant Hospital Events: Including procedures, antibiotic start and stop dates in addition to other pertinent events   10/26: Admitted w/ cc: weakness, dizziness, decreased appetite. initial eval: CO2 < 7, BUN 86, cr 8.64, ph 6.9, Nephrology consulted 10/27: urology  consulted for bilateral hydro: No intervention with improving hydro after foley placement  Interim History / Subjective:  Repeat renal and bladder ultrasound with improving hydronephrosis and therefore no intervention with Urology.    Objective   Blood pressure 126/64, pulse 78, temperature 98.5 F (36.9 C), temperature source Oral, resp. rate 19, height 5\' 4"  (1.626 m), weight 51.5 kg, SpO2 99 %.        Intake/Output Summary (Last 24 hours) at 07/29/2021 0752 Last data filed at 07/29/2021 0427 Gross per 24 hour  Intake 4490.58 ml  Output 2172 ml  Net 2318.58 ml    Filed Weights   07/27/21 1551 07/28/21 0452 07/29/21 0500  Weight: 49.2 kg 56.1 kg 51.5 kg    Examination: General: elderly female, resting in bed, no distress   HENT: Dry MM  Lungs: clear breath sounds, no use of accessory muscles, symmetric expansion Cardiovascular: RRR, no edema, warm and well perfused Abdomen: soft non tender, non distended, ostomy to LLQ Extremities: warm and dry  Neuro: alert, oriented, follows commands  GU: foley in place   Resolved Hospital Problem list    Assessment & Plan:   Acute on chronic renal failure w/ new bilateral hydronephrosis & hydroureter, history of recurrent UTI Metabolic acidosis (NAGMA) in setting of renal failure  H/O CKD stage IV  -Baseline Crt 2.3 -Etiology: NSAIDS, possible ATN, obstruction now with foley Plan -Nephrology Following  -Urology Consulted 10/27: repeat RBUS with resolved right hydronephrosis and improving left hydronephrosis, no intervention -Continue foley -BUN/Creatinine improving with adequate UOP, continue to monitor  -PO bicarb, missed 2 doses in last 24  hours due to nausea.  Repeat BMP this afternoon and consider restarting bicarb drip if CO2 trending down or patient continues to miss PO bicarb  Hypokalemia Hypomagnesemia Hypocalcemia Plan -Replace per provider as needed  UTI H/O Recurrent UTI, cultures 05/2016 and 01/2018 with  Klebsiella  Plan -s/p Received Cefepime x 1 10/26 -Rocephin 12/27-current   Anemia of chronic disease -Hemoglobin baseline 11-12 currently 7, believe secondary to acute renal failure Plan  -No active bleeding on exam -Transfuse for goal hgb > 7  H/o cancer: melanoma (anal requiring diverting colostomy  and LUExt), ovarian cancer s/p TAH-BSO Plan F/u w/ onc as out-pt   Best Practice (right click and "Reselect all SmartList Selections" daily)   Diet/type: Regular consistency (see orders) DVT prophylaxis: prophylactic heparin  GI prophylaxis: N/A Lines: N/A Foley:  Yes, and it is still needed Code Status:  full code Patient updated this am. Transfer out of ICU.  Repeat BMP at 1200, will follow up  Labs   CBC: Recent Labs  Lab 07/27/21 1053 07/27/21 1104 07/28/21 0046 07/28/21 0407 07/28/21 1606 07/29/21 0013  WBC 10.6*  --  6.8  --  8.6 8.1  NEUTROABS 8.8*  --   --   --   --   --   HGB 9.6* 10.2* 7.7* 6.5* 7.1* 7.1*  HCT 28.6* 30.0* 21.9* 19.0* 20.8* 20.8*  MCV 98.6  --  94.0  --  95.0 95.0  PLT 381  --  302  --  279 278     Basic Metabolic Panel: Recent Labs  Lab 07/27/21 1654 07/28/21 0046 07/28/21 0407 07/28/21 0727 07/28/21 1606 07/29/21 0013  NA 140 138 139 136 137 138  K 2.8* 2.2* 2.1* 2.4* 3.3* 3.6  CL 117* 111  --  108 110 113*  CO2 11* 14*  --  15* 16* 13*  GLUCOSE 134* 126*  --  110* 118* 118*  BUN 83* 81*  --  79* 70* 67*  CREATININE 7.89* 8.17*  --  7.86* 7.10* 6.56*  CALCIUM 6.5* 5.7*  --  5.4* 6.0* 6.4*  MG  --  1.0*  --   --  2.1 2.0  PHOS  --  6.8*  --   --   --  5.2*    GFR: Estimated Creatinine Clearance: 6.1 mL/min (A) (by C-G formula based on SCr of 6.56 mg/dL (H)). Recent Labs  Lab 07/27/21 1053 07/27/21 1240 07/27/21 1654 07/28/21 0046 07/28/21 1606 07/29/21 0013  PROCALCITON  --   --  0.24  --   --   --   WBC 10.6*  --   --  6.8 8.6 8.1  LATICACIDVEN  --  0.7  --   --   --   --      Liver Function Tests: Recent Labs   Lab 07/27/21 1053  AST 14*  ALT 13  ALKPHOS 91  BILITOT 0.8  PROT 6.6  ALBUMIN 3.2*    No results for input(s): LIPASE, AMYLASE in the last 168 hours. No results for input(s): AMMONIA in the last 168 hours.  ABG    Component Value Date/Time   PHART 7.422 07/28/2021 0407   PCO2ART 23.9 (L) 07/28/2021 0407   PO2ART 100 07/28/2021 0407   HCO3 15.6 (L) 07/28/2021 0407   TCO2 16 (L) 07/28/2021 0407   ACIDBASEDEF 8.0 (H) 07/28/2021 0407   O2SAT 98.0 07/28/2021 0407      Coagulation Profile: Recent Labs  Lab 07/27/21 1053  INR 1.2  Cardiac Enzymes: Recent Labs  Lab 07/27/21 1053  CKTOTAL 229     HbA1C: Hgb A1c MFr Bld  Date/Time Value Ref Range Status  10/27/2017 04:36 AM 5.2 4.8 - 5.6 % Final    Comment:    (NOTE) Pre diabetes:          5.7%-6.4% Diabetes:              >6.4% Glycemic control for   <7.0% adults with diabetes     CBG: Recent Labs  Lab 07/28/21 0725 07/28/21 1119 07/28/21 1511 07/28/21 1914 07/28/21 2346  GLUCAP 99 119* 105* 107* 123*      Critical care time: 45 min    Paulita Fujita, AGACNP-BC [facility]  PCCM Pgr: [phone]

## 2021-07-29 NOTE — Progress Notes (Signed)
Patient had vomiting episode at 23:56 and critical lab of 6.4 calcium at 01:17. E-link notified of both events. Will continue to monitor for any changes.

## 2021-07-29 NOTE — Progress Notes (Signed)
[facility] Progress Note Patient Name: Desiree Adams DOB: [birth year] MRN: [id number]   Date of Service  07/29/2021  HPI/Events of Note  K+ 3.6, Ca++ 6.4, creatinine 7.1.  eICU Interventions  KCL 40 meq po x 1, Calcium gluconate 2 gm iv x 1, LR infusion at 150 ml/ hour discontinued, NS infusion ordered at 100  ml / hour x 1 liter ordered.        Kerry Kass [name] 07/29/2021, 3:06 AM

## 2021-07-29 NOTE — Progress Notes (Signed)
[facility] Progress Note Patient Name: Desiree Adams DOB: [birth year] MRN: [id number]   Date of Service  07/29/2021  HPI/Events of Note  Patient with nausea and vomiting x 1, QTc 448.  eICU Interventions  Zofran 4 mg iv x 1 ordered.        Kerry Kass Khalessi Blough 07/29/2021, 12:25 AM

## 2021-07-30 DIAGNOSIS — N189 Chronic kidney disease, unspecified: Secondary | ICD-10-CM | POA: Diagnosis not present

## 2021-07-30 DIAGNOSIS — N179 Acute kidney failure, unspecified: Secondary | ICD-10-CM | POA: Diagnosis not present

## 2021-07-30 DIAGNOSIS — E43 Unspecified severe protein-calorie malnutrition: Secondary | ICD-10-CM

## 2021-07-30 LAB — BASIC METABOLIC PANEL
Anion gap: 12 (ref 5–15)
BUN: 56 mg/dL — ABNORMAL HIGH (ref 8–23)
CO2: 18 mmol/L — ABNORMAL LOW (ref 22–32)
Calcium: 7.1 mg/dL — ABNORMAL LOW (ref 8.9–10.3)
Chloride: 105 mmol/L (ref 98–111)
Creatinine, Ser: 5.67 mg/dL — ABNORMAL HIGH (ref 0.44–1.00)
GFR, Estimated: 7 mL/min — ABNORMAL LOW (ref >60)
Glucose, Bld: 149 mg/dL — ABNORMAL HIGH (ref 70–99)
Potassium: 3.4 mmol/L — ABNORMAL LOW (ref 3.5–5.1)
Sodium: 135 mmol/L (ref 135–145)

## 2021-07-30 LAB — CBC
HCT: 22.2 % — ABNORMAL LOW (ref 36.0–46.0)
Hemoglobin: 7.5 g/dL — ABNORMAL LOW (ref 12.0–15.0)
MCH: 32.3 pg (ref 26.0–34.0)
MCHC: 33.8 g/dL (ref 30.0–36.0)
MCV: 95.7 fL (ref 80.0–100.0)
Platelets: 299 10*3/uL (ref 150–400)
RBC: 2.32 MIL/uL — ABNORMAL LOW (ref 3.87–5.11)
RDW: 14.4 % (ref 11.5–15.5)
WBC: 7.9 10*3/uL (ref 4.0–10.5)
nRBC: 0.3 % — ABNORMAL HIGH (ref 0.0–0.2)

## 2021-07-30 LAB — PHOSPHORUS
Phosphorus: 4.3 mg/dL (ref 2.5–4.6)
Phosphorus: 4.8 mg/dL — ABNORMAL HIGH (ref 2.5–4.6)

## 2021-07-30 LAB — HEMOGLOBIN AND HEMATOCRIT, BLOOD
HCT: 22.8 % — ABNORMAL LOW (ref 36.0–46.0)
Hemoglobin: 7.7 g/dL — ABNORMAL LOW (ref 12.0–15.0)

## 2021-07-30 LAB — CALCIUM, IONIZED: Calcium, Ionized, Serum: 3.4 mg/dL — ABNORMAL LOW (ref 4.5–5.6)

## 2021-07-30 LAB — MAGNESIUM: Magnesium: 1.9 mg/dL (ref 1.7–2.4)

## 2021-07-30 MED ORDER — DARBEPOETIN ALFA 60 MCG/0.3ML IJ SOSY
60.0000 ug | PREFILLED_SYRINGE | Freq: Once | INTRAMUSCULAR | Status: AC
  Administered 2021-07-30: 60 ug via SUBCUTANEOUS
  Filled 2021-07-30: qty 0.3

## 2021-07-30 MED ORDER — CALCIUM CARBONATE ANTACID 500 MG PO CHEW
2.0000 | CHEWABLE_TABLET | Freq: Two times a day (BID) | ORAL | Status: DC
  Administered 2021-07-30 – 2021-07-31 (×2): 400 mg via ORAL
  Filled 2021-07-30 (×4): qty 2

## 2021-07-30 MED ORDER — MELATONIN 3 MG PO TABS
3.0000 mg | ORAL_TABLET | Freq: Every evening | ORAL | Status: DC | PRN
  Administered 2021-07-30: 3 mg via ORAL
  Filled 2021-07-30: qty 1

## 2021-07-30 MED ORDER — POTASSIUM CHLORIDE CRYS ER 20 MEQ PO TBCR
40.0000 meq | EXTENDED_RELEASE_TABLET | Freq: Once | ORAL | Status: AC
  Administered 2021-07-30: 40 meq via ORAL
  Filled 2021-07-30: qty 2

## 2021-07-30 NOTE — Progress Notes (Signed)
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 This was approximately 3 days into treatment for cellulitis of the left hand for which she was treated w/ Cipro and naproxen. In er initial eval: CO2 < 7, BUN 86, cr 8.64, ph 6.9, Nephrology consulted in ER. Recommened Bicarb replacement w/ IVFs. CT abd ordered which showed bilateral hydronephrosis, urology was consulted and nephrology was consulted as well and patient was admitted under PCCM and was subsequently transferred to [facility] on 07/30/2021.      Assessment & Plan:   Active Problems:   Acute renal failure (ARF) (HCC)   Acute on chronic renal failure (HCC)   Protein-calorie malnutrition, severe  Acute on chronic renal failure. H/o CKD stage IV w/ new Bilateral hydronephrosis & hydroureter/metabolic acidosis. Bl cr 2.3 range, presented with creatinine of 8.64. Suspect acute injury mixed picture from cipro and naproxen, as well as volume depletion. Not sure of the significance of the mild bilateral hydro.  Creatinine has a started improved, currently 5.6.  Good diuresis.  No plans for any cystoscopy or stent by urology.  Patient remains on bicarb drip.  Nephrology following.  Repeat ultrasound kidney shows improved hydronephrosis.   UTI: Urine culture growing multiple organisms.  Continue Rocephin.  Hyponatremia: Resolved.  Hypokalemia: 3.4.  Will replace.  Anemia of chronic disease: Hemoglobin is stable.  7.7 today.  Transfuse if less than 7.  Monitor.  H/o cancer: melanoma (anal  requiring diverting colostomy  and LUExt), ovarian cancer s/p TAH-BSO Plan F/u w/ onc as out-pt   DVT prophylaxis: heparin injection 5,000 Units Start: 07/27/21 1400   Code Status: Full Code  Family Communication:  None present at bedside.  Plan of care discussed with patient in length and he verbalized understanding and agreed with it.  Status is: Inpatient  Remains inpatient appropriate because: Renal function is still far from baseline and metabolic acidosis needs management  Estimated body mass index is 19.75 kg/m as calculated from the following:   Height as of this encounter: 5\' 4"  (1.626 m).   Weight as of this encounter: 52.2 kg.     Nutritional Assessment: Body mass index is 19.75 kg/m.Marland Kitchen Seen by dietician.  I agree with the assessment and plan as outlined below: Nutrition Status: Nutrition Problem: Severe Malnutrition Etiology: chronic illness (cancer) Signs/Symptoms: severe muscle depletion, severe fat depletion Interventions: Ensure Enlive (each supplement provides 350kcal and 20 grams of protein), MVI  .  Skin Assessment: I have examined the patient's skin and I agree with the wound assessment as performed by the wound care RN as outlined below:    Consultants:  Urology Nephrology  Procedures:  None  Antimicrobials:  Anti-infectives (From admission, onward)    Start     Dose/Rate Route Frequency Ordered Stop   07/28/21 1300  ceFEPIme (MAXIPIME) 1 g in sodium chloride 0.9 % 100 mL IVPB  Status:  Discontinued        1 g 200 mL/hr over 30 Minutes Intravenous Every 24 hours 07/27/21 1246 07/27/21 1639  07/28/21 1000  cefTRIAXone (ROCEPHIN) 1 g in sodium chloride 0.9 % 100 mL IVPB        1 g 200 mL/hr over 30 Minutes Intravenous Every 24 hours 07/28/21 0858 08/03/21 2359   07/27/21 1245  ceFEPIme (MAXIPIME) 2 g in sodium chloride 0.9 % 100 mL IVPB        2 g 200 mL/hr over 30 Minutes Intravenous  Once 07/27/21 1239 07/27/21 1550           Subjective: Patient seen and examined.  She is doing well.  She has no complaints but she is literally begging to discharge her since she does not like the bed, cannot sleep and "food is terrible as well".  Objective: Vitals:   07/30/21 0600 07/30/21 0630 07/30/21 0700 07/30/21 0757  BP: (!) 160/56     Pulse: 77 73 66   Resp: 20 17 14    Temp:    98.2 F (36.8 C)  TempSrc:    Oral  SpO2: 99% 100% 100%   Weight:      Height:        Intake/Output Summary (Last 24 hours) at 07/30/2021 1313 Last data filed at 07/30/2021 0900 Gross per 24 hour  Intake 1960.26 ml  Output 3050 ml  Net -1089.74 ml   Filed Weights   07/28/21 0452 07/29/21 0500 07/30/21 0457  Weight: 56.1 kg 51.5 kg 52.2 kg    Examination:  General exam: Appears calm and comfortable  Respiratory system: Clear to auscultation. Respiratory effort normal. Cardiovascular system: S1 & S2 heard, RRR. No JVD, murmurs, rubs, gallops or clicks. No pedal edema. Gastrointestinal system: Abdomen is nondistended, soft and nontender. No organomegaly or masses felt. Normal bowel sounds heard. Central nervous system: Alert and oriented. No focal neurological deficits. Extremities: Symmetric 5 x 5 power. Skin: No rashes, lesions or ulcers Psychiatry: Judgement and insight appear normal. Mood & affect appropriate.    Data Reviewed: I have personally reviewed following labs and imaging studies  CBC: Recent Labs  Lab 07/27/21 1053 07/27/21 1104 07/28/21 0046 07/28/21 0407 07/28/21 1606 07/29/21 0013 07/30/21 0817 07/30/21 1153  WBC 10.6*  --  6.8  --  8.6 8.1 7.9  --   NEUTROABS 8.8*  --   --   --   --   --   --   --   HGB 9.6*   < > 7.7* 6.5* 7.1* 7.1* 7.5* 7.7*  HCT 28.6*   < > 21.9* 19.0* 20.8* 20.8* 22.2* 22.8*  MCV 98.6  --  94.0  --  95.0 95.0 95.7  --   PLT 381  --  302  --  279 278 299  --    < > = values in this interval not displayed.   Basic Metabolic Panel: Recent Labs  Lab 07/28/21 0046  07/28/21 0407 07/28/21 1606 07/29/21 0013 07/29/21 0902 07/29/21 1244 07/30/21 0337 07/30/21 0817  NA 138   < > 137 138 137 131*  --  135  K 2.2*   < > 3.3* 3.6 4.1 3.8  --  3.4*  CL 111   < > 110 113* 113* 109  --  105  CO2 14*   < > 16* 13* 15* 11*  --  18*  GLUCOSE 126*   < > 118* 118* 141* 163*  --  149*  BUN 81*   < > 70* 67* 62* 62*  --  56*  CREATININE 8.17*   < > 7.10* 6.56* 6.40* 6.22*  --  5.67*  CALCIUM 5.7*   < > 6.0* 6.4* 7.4* 6.9*  --  7.1*  MG 1.0*  --  2.1 2.0  --   --  1.9  --   PHOS 6.8*  --   --  5.2*  --   --  4.3  --    < > = values in this interval not displayed.   GFR: Estimated Creatinine Clearance: 7.2 mL/min (A) (by C-G formula based on SCr of 5.67 mg/dL (H)). Liver Function Tests: Recent Labs  Lab 07/27/21 1053  AST 14*  ALT 13  ALKPHOS 91  BILITOT 0.8  PROT 6.6  ALBUMIN 3.2*   No results for input(s): LIPASE, AMYLASE in the last 168 hours. No results for input(s): AMMONIA in the last 168 hours. Coagulation Profile: Recent Labs  Lab 07/27/21 1053  INR 1.2   Cardiac Enzymes: Recent Labs  Lab 07/27/21 1053  CKTOTAL 229   BNP (last 3 results) No results for input(s): PROBNP in the last 8760 hours. HbA1C: No results for input(s): HGBA1C in the last 72 hours. CBG: Recent Labs  Lab 07/28/21 0725 07/28/21 1119 07/28/21 1511 07/28/21 1914 07/28/21 2346  GLUCAP 99 119* 105* 107* 123*   Lipid Profile: No results for input(s): CHOL, HDL, LDLCALC, TRIG, CHOLHDL, LDLDIRECT in the last 72 hours. Thyroid Function Tests: No results for input(s): TSH, T4TOTAL, FREET4, T3FREE, THYROIDAB in the last 72 hours. Anemia Panel: No results for input(s): VITAMINB12, FOLATE, FERRITIN, TIBC, IRON, RETICCTPCT in the last 72 hours. Sepsis Labs: Recent Labs  Lab 07/27/21 1240 07/27/21 1654  PROCALCITON  --  0.24  LATICACIDVEN 0.7  --     Recent Results (from the past 240 hour(s))  Resp Panel by RT-PCR (Flu A&B, Covid) Nasopharyngeal Swab      Status: None   Collection Time: 07/27/21 12:40 PM   Specimen: Nasopharyngeal Swab; Nasopharyngeal(NP) swabs in vial transport medium  Result Value Ref Range Status   SARS Coronavirus 2 by RT PCR NEGATIVE NEGATIVE Final    Comment: (NOTE) SARS-CoV-2 target nucleic acids are NOT DETECTED.  The SARS-CoV-2 RNA is generally detectable in upper respiratory specimens during the acute phase of infection. The lowest concentration of SARS-CoV-2 viral copies this assay can detect is 138 copies/mL. A negative result does not preclude SARS-Cov-2 infection and should not be used as the sole basis for treatment or other patient management decisions. A negative result may occur with  improper specimen collection/handling, submission of specimen other than nasopharyngeal swab, presence of viral mutation(s) within the areas targeted by this assay, and inadequate number of viral copies(<138 copies/mL). A negative result must be combined with clinical observations, patient history, and epidemiological information. The expected result is Negative.  Fact Sheet for Patients:  EntrepreneurPulse.com.au  Fact Sheet for Healthcare Providers:  IncredibleEmployment.be  This test is no t yet approved or cleared by the [place] FDA and  has been authorized for detection and/or diagnosis of SARS-CoV-2 by FDA under an Emergency Use Authorization (EUA). This EUA will remain  in effect (meaning this test can be used) for the duration of the COVID-19 declaration under Section 564(b)(1) of the Act, 21 U.S.C.section 360bbb-3(b)(1), unless the authorization is terminated  or revoked sooner.       Influenza A by PCR NEGATIVE NEGATIVE Final   Influenza B by PCR NEGATIVE NEGATIVE Final    Comment: (NOTE) The Xpert Xpress SARS-CoV-2/FLU/RSV plus assay is intended as an aid in the diagnosis of influenza from Nasopharyngeal swab specimens and  should not be used as a sole basis  for treatment. Nasal washings and aspirates are unacceptable for Xpert Xpress SARS-CoV-2/FLU/RSV testing.  Fact Sheet for Patients: EntrepreneurPulse.com.au  Fact Sheet for Healthcare Providers: IncredibleEmployment.be  This test is not yet approved or cleared by the [place] FDA and has been authorized for detection and/or diagnosis of SARS-CoV-2 by FDA under an Emergency Use Authorization (EUA). This EUA will remain in effect (meaning this test can be used) for the duration of the COVID-19 declaration under Section 564(b)(1) of the Act, 21 U.S.C. section 360bbb-3(b)(1), unless the authorization is terminated or revoked.  Performed at [facility], [address] [place], [place] [address]   Blood Culture (routine x 2)     Status: None (Preliminary result)   Collection Time: 07/27/21 12:40 PM   Specimen: BLOOD LEFT WRIST  Result Value Ref Range Status   Specimen Description   Final    BLOOD LEFT WRIST Performed at [facility], [address] [place], [place] [address]    Special Requests   Final    BOTTLES DRAWN AEROBIC ONLY Blood Culture adequate volume Performed at [facility], [address] [place], [place] [address]    Culture   Final    NO GROWTH 3 DAYS Performed at [facility], [place] [address], [place], [place] [address]    Report Status PENDING  Incomplete  MRSA Next Gen by PCR, Nasal     Status: None   Collection Time: 07/27/21  4:03 PM   Specimen: Nasal Mucosa; Nasal Swab  Result Value Ref Range Status   MRSA by PCR Next Gen NOT DETECTED NOT DETECTED Final    Comment: (NOTE) The GeneXpert MRSA Assay (FDA approved for NASAL specimens only), is one component of a comprehensive MRSA colonization surveillance program. It is not intended to diagnose MRSA infection nor to guide or monitor treatment for MRSA infections. Test performance is not FDA approved in patients less than 58  years old. Performed at [facility], [place] [address] [place], [place] [address]   Urine Culture     Status: Abnormal   Collection Time: 07/27/21 10:28 PM   Specimen: Urine, Clean Catch  Result Value Ref Range Status   Specimen Description URINE, CLEAN CATCH  Final   Special Requests   Final    Normal Performed at [facility], [place] [address], [place],  [address]    Culture MULTIPLE SPECIES PRESENT, SUGGEST RECOLLECTION (A)  Final   Report Status 07/28/2021 FINAL  Final      Radiology Studies: US RENAL  Result Date: 07/28/2021 CLINICAL DATA:  Follow-up hydronephrosis EXAM: RENAL / URINARY TRACT ULTRASOUND COMPLETE COMPARISON:  07/27/2021 FINDINGS: Right Kidney: Renal measurements: 10.0 x 3.7 x 4.1 cm = volume: 80 mL. Increased renal parenchymal echogenicity. 1.0 cm upper pole cyst. No shadowing stone or hydronephrosis. Left Kidney: Renal measurements: 9.7 x 5.2 x 4.8 cm = volume: 127 mL. Increased renal parenchymal echogenicity. Mild hydronephrosis. No shadowing stone. No solid or cystic mass identified. Bladder: Decompressed by Foley catheter. Other: Small amount of free fluid is present. IMPRESSION: 1. Resolved right-sided hydronephrosis. Improved left-sided hydronephrosis, now mild. 2. Increased renal parenchymal echogenicity suggesting medical renal disease. 3. Small volume ascites. Electronically Signed   By: Davina Poke D.O.   On: 07/28/2021 17:07    Scheduled Meds:  calcium carbonate  2 tablet Oral BID   Chlorhexidine Gluconate Cloth  6  each Topical Q0600   feeding supplement (NEPRO CARB STEADY)  237 mL Oral BID BM   heparin  5,000 Units Subcutaneous Q8H   multivitamin with minerals  1 tablet Oral Daily   sodium bicarbonate  1,300 mg Oral TID   Continuous Infusions:  cefTRIAXone (ROCEPHIN)  IV 1 g (07/30/21 0949)     LOS: 3 days   Time spent: 35 minutes   Darliss Cheney, MD [facility]  07/30/2021, 1:13 PM  Please page via [place] and do  not message via secure chat for anything urgent. Secure chat can be used for anything non urgent.  How to contact the [facility] Attending or Consulting provider [place] or covering provider during after hours [place], for this patient?  Check the care team in [facility] and look for a) attending/consulting [facility] provider listed and b) the [facility] team listed. Page or secure chat [phone]. Log into [email or web address] and use [place]'s universal password to access. If you do not have the password, please contact the hospital operator. Locate the [facility] provider you are looking for under [facility] and page to a number that you can be directly reached. If you still have difficulty reaching the provider, please page the [facility] (Director on Call) for the Hospitalists listed on amion for assistance.

## 2021-07-30 NOTE — Progress Notes (Signed)
[place] Progress Note Patient Name: Desiree Adams DOB: 04/02/1946 MRN: [id number]   Date of Service  07/30/2021  HPI/Events of Note  Patient requesting Melatonin as a sleep aid.  eICU Interventions  Melatonin 3 mg po Q HS PRN insomnia x 3 doses ordered.        Kerry Kass Jordyn Doane 07/30/2021, 2:29 AM

## 2021-07-30 NOTE — Progress Notes (Addendum)
Subjective:   UOP 1.9L yesterday.  Resumed bicarb gtt yesterday afternoon due to serum bicarb 11 and not tolerating all of the oral bicarb.  In for transfer out of ICU.  Labs this AM pending.  Says appetite good now and she's feeling much better.  Hospital food is 'horrible' and she's begging for discharge ASAP.    Objective Vital signs in last 24 hours: Vitals:   07/30/21 0600 07/30/21 0630 07/30/21 0700 07/30/21 0757  BP: (!) 160/56     Pulse: 77 73 66   Resp: 20 17 14    Temp:    98.2 F (36.8 C)  TempSrc:    Oral  SpO2: 99% 100% 100%   Weight:      Height:       Weight change: 0.7 kg  Intake/Output Summary (Last 24 hours) at 07/30/2021 0820 Last data filed at 07/30/2021 0600 Gross per 24 hour  Intake 2197.26 ml  Output 4020 ml  Net -1822.74 ml     Assessment/Plan: 75 year old WF with many medical issues-  she has A on CRF in the setting of cipro ? toxicity and NSAIDs, ATN in setting of sepsis 1.AKI on CKD 4 -  baseline Cr ~2.3 now with severe AKI in setting of NSAIDs, possible ATN, obstruction now s/p foley.  Follows with [facility] nephrology outpt.  Urology consulted and agree with foley decompression and rec outpt urology f/u - no stent or further intervention needed. Cr slowly improving to 6.2 today from 6.6 yesterday AM and max 8.6 on admission.  She's nonoliguric and no current indications for dialysis.  Maintain foley I'd expect until outpt urology follow up.  2. Non anion gap metabolic acidosis- Acute on chronic issue.  AKI + GI losses.  Bicarb gtt resumed yesterday for serum bicarb 11 and not tolerating all po bicarb.  F/u this AMs labs and d/c bicarb gtt if acceptable to see if she can maintain on oral bicarb.  She says the tablets are not causing nausea now.   3. Hypertension/volume  - Currently on bicarb gtt with no e/o volume overload and great UOP.  BPs now normal to slightly high.  Once bicarb issue improved with can probably stop MIVF altogether. 4.  Hypokalemia- pending K  this AM; continue oral supplement PRN.  4. Anemia  - Hb trended down, s/p transfusion per Primary.  Will dose with ESA.  5. ID-  purulent urine initially - looks normal in bag now - was on cefepime and now CTX-  blood cultures NGTD and urine culture with multiple species present-  antibiotics per primary 6. Hypocalcemia: being repleted.   Dispo - would like to go home ASAP - out of ICU today, I think if renal function continues to improve and her bicarb improves she could likely d/c home soon with very close outpt follow up.    Justin Mend    Labs: Basic Metabolic Panel: Recent Labs  Lab 07/28/21 0046 07/28/21 0407 07/29/21 0013 07/29/21 0902 07/29/21 1244 07/30/21 0337  NA 138   < > 138 137 131*  --   K 2.2*   < > 3.6 4.1 3.8  --   CL 111   < > 113* 113* 109  --   CO2 14*   < > 13* 15* 11*  --   GLUCOSE 126*   < > 118* 141* 163*  --   BUN 81*   < > 67* 62* 62*  --   CREATININE 8.17*   < >  6.56* 6.40* 6.22*  --   CALCIUM 5.7*   < > 6.4* 7.4* 6.9*  --   PHOS 6.8*  --  5.2*  --   --  4.3   < > = values in this interval not displayed.    Liver Function Tests: Recent Labs  Lab 07/27/21 1053  AST 14*  ALT 13  ALKPHOS 91  BILITOT 0.8  PROT 6.6  ALBUMIN 3.2*    No results for input(s): LIPASE, AMYLASE in the last 168 hours. No results for input(s): AMMONIA in the last 168 hours. CBC: Recent Labs  Lab 07/27/21 1053 07/27/21 1104 07/28/21 0046 07/28/21 0407 07/28/21 1606 07/29/21 0013  WBC 10.6*  --  6.8  --  8.6 8.1  NEUTROABS 8.8*  --   --   --   --   --   HGB 9.6*   < > 7.7* 6.5* 7.1* 7.1*  HCT 28.6*   < > 21.9* 19.0* 20.8* 20.8*  MCV 98.6  --  94.0  --  95.0 95.0  PLT 381  --  302  --  279 278   < > = values in this interval not displayed.    Cardiac Enzymes: Recent Labs  Lab 07/27/21 1053  CKTOTAL 229    CBG: Recent Labs  Lab 07/28/21 0725 07/28/21 1119 07/28/21 1511 07/28/21 1914 07/28/21 2346  GLUCAP 99 119* 105* 107* 123*      Iron Studies: No results for input(s): IRON, TIBC, TRANSFERRIN, FERRITIN in the last 72 hours. Studies/Results: US RENAL  Result Date: 07/28/2021 CLINICAL DATA:  Follow-up hydronephrosis EXAM: RENAL / URINARY TRACT ULTRASOUND COMPLETE COMPARISON:  07/27/2021 FINDINGS: Right Kidney: Renal measurements: 10.0 x 3.7 x 4.1 cm = volume: 80 mL. Increased renal parenchymal echogenicity. 1.0 cm upper pole cyst. No shadowing stone or hydronephrosis. Left Kidney: Renal measurements: 9.7 x 5.2 x 4.8 cm = volume: 127 mL. Increased renal parenchymal echogenicity. Mild hydronephrosis. No shadowing stone. No solid or cystic mass identified. Bladder: Decompressed by Foley catheter. Other: Small amount of free fluid is present. IMPRESSION: 1. Resolved right-sided hydronephrosis. Improved left-sided hydronephrosis, now mild. 2. Increased renal parenchymal echogenicity suggesting medical renal disease. 3. Small volume ascites. Electronically Signed   By: Davina Poke D.O.   On: 07/28/2021 17:07   Medications: Infusions:  cefTRIAXone (ROCEPHIN)  IV Stopped (07/29/21 1045)    sodium bicarbonate (isotonic) infusion in sterile water 100 mL/hr at 07/30/21 0600    Scheduled Medications:  Chlorhexidine Gluconate Cloth  6 each Topical Q0600   feeding supplement (NEPRO CARB STEADY)  237 mL Oral BID BM   heparin  5,000 Units Subcutaneous Q8H   multivitamin with minerals  1 tablet Oral Daily   sodium bicarbonate  1,300 mg Oral TID    have reviewed scheduled and prn medications.  Physical Exam: General: sleepy but appears comfortable Heart: RRR Lungs: mostly clear Abdomen: thin, soft, minimal tenderness Extremities: no edema   07/30/2021,8:20 AM  LOS: 3 days   Addendum:  labs this AM showing serum bicarb 18, K 3.4 - stopping bicarb gtt and giving po KCl; she is aware of need to take na bicarb tablets

## 2021-07-31 DIAGNOSIS — N184 Chronic kidney disease, stage 4 (severe): Secondary | ICD-10-CM

## 2021-07-31 DIAGNOSIS — N390 Urinary tract infection, site not specified: Secondary | ICD-10-CM | POA: Diagnosis not present

## 2021-07-31 DIAGNOSIS — N320 Bladder-neck obstruction: Secondary | ICD-10-CM

## 2021-07-31 DIAGNOSIS — N179 Acute kidney failure, unspecified: Secondary | ICD-10-CM | POA: Diagnosis not present

## 2021-07-31 DIAGNOSIS — N1330 Unspecified hydronephrosis: Secondary | ICD-10-CM | POA: Diagnosis not present

## 2021-07-31 LAB — RENAL FUNCTION PANEL
Albumin: 2 g/dL — ABNORMAL LOW (ref 3.5–5.0)
Anion gap: 8 (ref 5–15)
BUN: 54 mg/dL — ABNORMAL HIGH (ref 8–23)
CO2: 19 mmol/L — ABNORMAL LOW (ref 22–32)
Calcium: 7.3 mg/dL — ABNORMAL LOW (ref 8.9–10.3)
Chloride: 111 mmol/L (ref 98–111)
Creatinine, Ser: 4.85 mg/dL — ABNORMAL HIGH (ref 0.44–1.00)
GFR, Estimated: 9 mL/min — ABNORMAL LOW (ref >60)
Glucose, Bld: 105 mg/dL — ABNORMAL HIGH (ref 70–99)
Phosphorus: 5 mg/dL — ABNORMAL HIGH (ref 2.5–4.6)
Potassium: 3.7 mmol/L (ref 3.5–5.1)
Sodium: 138 mmol/L (ref 135–145)

## 2021-07-31 LAB — PHOSPHORUS: Phosphorus: 4.9 mg/dL — ABNORMAL HIGH (ref 2.5–4.6)

## 2021-07-31 LAB — MAGNESIUM: Magnesium: 1.6 mg/dL — ABNORMAL LOW (ref 1.7–2.4)

## 2021-07-31 MED ORDER — HYDRALAZINE HCL 20 MG/ML IJ SOLN
5.0000 mg | INTRAMUSCULAR | Status: DC | PRN
  Administered 2021-07-31: 10 mg via INTRAVENOUS
  Filled 2021-07-31: qty 1

## 2021-07-31 MED ORDER — MAGNESIUM SULFATE 2 GM/50ML IV SOLN
2.0000 g | Freq: Once | INTRAVENOUS | Status: AC
  Administered 2021-07-31: 2 g via INTRAVENOUS
  Filled 2021-07-31: qty 50

## 2021-07-31 MED ORDER — CEPHALEXIN 500 MG PO CAPS: 500.0000 mg | ORAL_CAPSULE | Freq: Three times a day (TID) | ORAL | 0 refills | Status: AC

## 2021-07-31 NOTE — Progress Notes (Signed)
Patient is ready for discharge. I have reviewed all discharge instructions with patient and answered all questions regarding her discharge. I have given patient instructions on her foley care and how to empty foley as she will be leaving the hospital with her foley catheter per Dr. Doristine Bosworth verbal order. Patient IV has been removed without complications. Patient will leave the unit via wheelchair and will be transported home by her sister who will meet her at the front entrance of the hospital.

## 2021-07-31 NOTE — Progress Notes (Signed)
Subjective:   UOP 2.2L yesterday, stool volume (ostomy) measured 2.8L - says outpt is typical for her.  Labs this AM showing bicarb 19 off bicarb gtt since yest AM and Cr improved from 5.7 to 4.8 (off IVF).  Only complaint is desires discharge.   Objective Vital signs in last 24 hours: Vitals:   07/31/21 0400 07/31/21 0405 07/31/21 0500 07/31/21 0600  BP: (!) 172/69  (!) 182/78 (!) 168/69  Pulse: 70  67 84  Resp: (!) 21  (!) 25 (!) 24  Temp:  98.3 F (36.8 C)    TempSrc:  Axillary    SpO2: 96%  93% 90%  Weight:   53.1 kg   Height:       Weight change: 0.9 kg  Intake/Output Summary (Last 24 hours) at 07/31/2021 7628 Last data filed at 07/31/2021 0600 Gross per 24 hour  Intake 500 ml  Output 5100 ml  Net -4600 ml     Assessment/Plan: 75 year old WF with many medical issues-  she has A on CRF in the setting of cipro ? toxicity and NSAIDs, ATN in setting of sepsis 1.AKI on CKD 4 -  baseline Cr ~2.3 now with severe AKI in setting of NSAIDs, possible ATN, obstruction now s/p foley.  Follows with [facility] nephrology outpt.  Urology consulted and agree with foley decompression and rec outpt urology f/u - no stent or further intervention needed. Cr cont slowly improving to 4.8 today.  She's nonoliguric and no current indications for dialysis.  Maintain foley I'd expect until outpt urology follow up.  2. Non anion gap metabolic acidosis- Acute on chronic issue.  AKI + GI losses.  Doing fine on po bicarb now  3. Hypertension/volume  - Has been HTN in the past day but no underlying HTN.  Suspect this is related to volume resuscitation.  Has PRN hydralazine. 4.  Hypokalemia- K ok this AM.  4. Anemia  - Hb trended down, s/p transfusion per Primary.  S/p dose with ESA.  5. ID-  purulent urine initially - looks normal in bag now - was on cefepime and now CTX-  blood cultures NGTD and urine culture with multiple species present-  antibiotics per primary 6. Hypocalcemia: corrects to low normal.   Dispo:  Ok to d/c from nephrology perspective.  I think her renal function will continue to further improve but she needs close f/u with her nephrologist this week.  She is aware of this and should call Monday AM to schedule.  She will also need close urology follow up after discharge due to the urinary retention and need for foley.  I will sign off, please call if additional assistance needed.    [place]: Basic Metabolic Panel: Recent Labs  Lab 07/29/21 1244 07/30/21 0337 07/30/21 0817 07/30/21 1153 07/31/21 0211  NA 131*  --  135  --  138  K 3.8  --  3.4*  --  3.7  CL 109  --  105  --  111  CO2 11*  --  18*  --  19*  GLUCOSE 163*  --  149*  --  105*  BUN 62*  --  56*  --  54*  CREATININE 6.22*  --  5.67*  --  4.85*  CALCIUM 6.9*  --  7.1*  --  7.3*  PHOS  --  4.3  --  4.8* 4.9*  5.0*    Liver Function Tests: Recent Labs  Lab 07/27/21 1053  07/31/21 0211  AST 14*  --   ALT 13  --   ALKPHOS 91  --   BILITOT 0.8  --   PROT 6.6  --   ALBUMIN 3.2* 2.0*    No results for input(s): LIPASE, AMYLASE in the last 168 hours. No results for input(s): AMMONIA in the last 168 hours. CBC: Recent Labs  Lab 07/27/21 1053 07/27/21 1104 07/28/21 0046 07/28/21 0407 07/28/21 1606 07/29/21 0013 07/30/21 0817 07/30/21 1153  WBC 10.6*  --  6.8  --  8.6 8.1 7.9  --   NEUTROABS 8.8*  --   --   --   --   --   --   --   HGB 9.6*   < > 7.7*   < > 7.1* 7.1* 7.5* 7.7*  HCT 28.6*   < > 21.9*   < > 20.8* 20.8* 22.2* 22.8*  MCV 98.6  --  94.0  --  95.0 95.0 95.7  --   PLT 381  --  302  --  279 278 299  --    < > = values in this interval not displayed.    Cardiac Enzymes: Recent Labs  Lab 07/27/21 1053  CKTOTAL 229    CBG: Recent Labs  Lab 07/28/21 0725 07/28/21 1119 07/28/21 1511 07/28/21 1914 07/28/21 2346  GLUCAP 99 119* 105* 107* 123*     Iron Studies: No results for input(s): IRON, TIBC, TRANSFERRIN, FERRITIN in the last 72 hours. Studies/Results: No  results found. Medications: Infusions:  cefTRIAXone (ROCEPHIN)  IV 1 g (07/30/21 0949)    Scheduled Medications:  calcium carbonate  2 tablet Oral BID   Chlorhexidine Gluconate Cloth  6 each Topical Q0600   feeding supplement (NEPRO CARB STEADY)  237 mL Oral BID BM   heparin  5,000 Units Subcutaneous Q8H   multivitamin with minerals  1 tablet Oral Daily   sodium bicarbonate  1,300 mg Oral TID    have reviewed scheduled and prn medications.  Physical Exam: General: sleepy but appears comfortable Heart: RRR Lungs: mostly clear Abdomen: thin, soft, minimal tenderness Extremities: no edema   07/31/2021,7:21 AM  LOS: 4 days   Addendum:  labs this AM showing serum bicarb 18, K 3.4 - stopping bicarb gtt and giving po KCl; she is aware of need to take na bicarb tablets

## 2021-07-31 NOTE — Discharge Summary (Signed)
Physician Discharge Summary  Desiree Adams SNK:[id number] DOB: December 08, 1945 DOA: 07/27/2021  PCP: Libby Maw, MD  Admit date: 07/27/2021 Discharge date: 07/31/2021 30 Day Unplanned Readmission Risk Score    Flowsheet Row ED to Hosp-Admission (Current) from 07/27/2021 in [place] ICU  30 Day Unplanned Readmission Risk Score (%) 20.99 Filed at 07/31/2021 0801       This score is the patient's risk of an unplanned readmission within 30 days of being discharged (0 -100%). The score is based on dignosis, age, lab data, medications, orders, and past utilization.   Low:  0-14.9   Medium: 15-21.9   High: 22-29.9   Extreme: 30 and above          Admitted From: Home Disposition: Home  Recommendations for Outpatient Follow-up:  Follow up with PCP in 1-2 weeks Please obtain BMP/CBC in one week Follow-up with your primary urologist and nephrologist within the next few days. Please follow up with your PCP on the following pending results: Unresulted Labs (From admission, onward)     Start     Ordered   07/30/21 0500  Magnesium  Daily,   R     Question:  Specimen collection method  Answer:  Lab=Lab collect   07/29/21 1030   07/30/21 0500  Phosphorus  Daily,   R     Question:  Specimen collection method  Answer:  Lab=Lab collect   07/29/21 1030              Home Health: None Equipment/Devices: None  Discharge Condition: Stable CODE STATUS: Full code Diet recommendation: Cardiac  Subjective: Patient seen and examined.  She has no complaints but she is demanding her discharge.  Nephrology has cleared her as well as urology.  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 This was approximately 3 days into treatment for cellulitis of the left hand for which she was treated w/ Cipro and naproxen. In er initial eval: CO2 < 7, BUN 86, cr 8.64, ph 6.9, Nephrology consulted in ER. Recommened Bicarb replacement w/ IVFs. CT abd ordered which showed bilateral hydronephrosis, urology was consulted and nephrology was consulted as well and patient was admitted under PCCM and was subsequently transferred to [facility] on 07/30/2021.  Foley catheter was inserted.  Her baseline creatinine is 2.3, as mentioned above, she presented with 8.64.  After Foley catheter, she had significant urine output, her creatinine improved and currently around 4.5.  Repeat ultrasound kidney also showed improved hydronephrosis so urology canceled the plan for any intervention or cystoscopy that they were thinking prior to this however they recommended that patient should be discharged with Foley catheter and follow-up with urology in few days.  Although patient's creatinine is still higher than her baseline but since patient has been demanding to discharge her so nephrology has cleared her with instructions to follow-up with nephrology as soon as possible as well.  Her electrolytes are stable.   UTI: Urine culture growing multiple organisms.  She was on Rocephin, received 5 days.  She is being discharged on 5 more days of oral Keflex.   Hyponatremia: Resolved.  Hypokalemia: Resolved.   Anemia of chronic disease: Hemoglobin is stable.    H/o cancer: melanoma (anal requiring diverting colostomy  and LUExt), ovarian cancer s/p TAH-BSO Plan F/u w/ onc as out-pt   Discharge Diagnoses:  Active Problems:   Acute renal failure superimposed on stage 4 chronic kidney disease (HCC)   Protein-calorie malnutrition, severe   Acute lower UTI   Bilateral hydronephrosis   Bladder outlet obstruction    Discharge Instructions   Allergies  as of 07/31/2021       Reactions   Other Hives   Reaction to hospital sheets ( pt is allergic to TIDE and [place])        Medication List     STOP taking these medications    ciprofloxacin 500 MG tablet Commonly known as: CIPRO       TAKE these medications    cephALEXin 500 MG capsule Commonly known as: KEFLEX Take 1 capsule (500 mg total) by mouth 3 (three) times daily for 5 days.   cholestyramine 4 g packet Commonly known as: QUESTRAN Take 1 packet by mouth 3 (three) times daily.   famotidine 20 MG tablet Commonly known as: PEPCID Take 20 mg by mouth daily.   MULTI-VITAMIN PO Take 5 mLs by mouth See admin instructions. Mix 5 mls multivitamin vegetable powder in water and drink   ondansetron 4 MG disintegrating tablet Commonly known as: Zofran ODT 4mg  ODT q4 hours prn nausea/vomit What changed:  how much to take how to take this when to take this reasons to take this additional instructions   polyvinyl alcohol 1.4 % ophthalmic solution Commonly known as: LIQUIFILM TEARS Place 1 drop into both eyes at bedtime as needed for dry eyes.        Follow-up Information     Libby Maw, MD Follow up in 1 week(s).   Specialty: Family Medicine Contact information: [place] [address] [phone]         Ulyses Southward., MD. Call.   Specialty: Urology Contact information: [address] [place] [address] [phone]                Allergies  Allergen Reactions   Other Hives    Reaction to hospital sheets ( pt is allergic to TIDE and [facility])    Consultations: Urology and nephrology   Procedures/Studies: CT ABDOMEN PELVIS WO CONTRAST  Result Date: 07/27/2021 CLINICAL DATA:  Shortness of breath, dizziness, blurry vision, bowel obstruction suspected EXAM: CT ABDOMEN AND PELVIS WITHOUT CONTRAST TECHNIQUE: Multidetector CT imaging of the abdomen and pelvis was performed following the standard  protocol without IV contrast. COMPARISON:  12/28/2018 FINDINGS: Lower chest: No acute abnormality. Hepatobiliary: No focal liver abnormality is seen. Status post cholecystectomy. No biliary dilatation. Pancreas: Unremarkable. No pancreatic ductal dilatation or surrounding inflammatory changes. Spleen: Normal in size without significant abnormality. Adrenals/Urinary Tract: Adrenal glands are unremarkable. Mild bilateral hydronephrosis and hydroureter without obstructing etiology identified to the ureterovesicular junctions. Stomach/Bowel: Stomach is within normal limits. Unchanged postoperative/post treatment appearance of the pelvis, status post abdominoperineal resection with extensive soft tissue thickening throughout the pelvis and left lower quadrant end colostomy. Multiple resection suture lines of the small bowel in the right hemiabdomen. Vascular/Lymphatic: Aortic atherosclerosis. No enlarged abdominal or pelvic lymph nodes. Reproductive: No mass or other significant abnormality. Other: No abdominal wall hernia or abnormality. No abdominopelvic ascites. Musculoskeletal: No acute or significant osseous findings. IMPRESSION: 1. Unchanged postoperative/post treatment appearance of the pelvis, status post  abdominoperineal resection with extensive soft tissue thickening throughout the pelvis and left lower quadrant end colostomy. 2. Multiple small bowel resection without evidence of obstruction or other acute findings at this time. 3. Mild bilateral hydronephrosis and hydroureter without obstructing etiology identified to the ureterovesicular junctions. Aortic Atherosclerosis (ICD10-I70.0). Electronically Signed   By: Delanna Ahmadi M.D.   On: 07/27/2021 13:17   US RENAL  Result Date: 07/28/2021 CLINICAL DATA:  Follow-up hydronephrosis EXAM: RENAL / URINARY TRACT ULTRASOUND COMPLETE COMPARISON:  07/27/2021 FINDINGS: Right Kidney: Renal measurements: 10.0 x 3.7 x 4.1 cm = volume: 80 mL. Increased renal  parenchymal echogenicity. 1.0 cm upper pole cyst. No shadowing stone or hydronephrosis. Left Kidney: Renal measurements: 9.7 x 5.2 x 4.8 cm = volume: 127 mL. Increased renal parenchymal echogenicity. Mild hydronephrosis. No shadowing stone. No solid or cystic mass identified. Bladder: Decompressed by Foley catheter. Other: Small amount of free fluid is present. IMPRESSION: 1. Resolved right-sided hydronephrosis. Improved left-sided hydronephrosis, now mild. 2. Increased renal parenchymal echogenicity suggesting medical renal disease. 3. Small volume ascites. Electronically Signed   By: Davina Poke D.O.   On: 07/28/2021 17:07   US RENAL  Result Date: 07/27/2021 CLINICAL DATA:  Acute kidney injury EXAM: RENAL / URINARY TRACT ULTRASOUND COMPLETE COMPARISON:  CT 07/27/2021 FINDINGS: Right Kidney: Renal measurements: 10.1 x 4.9 x 3 cm = volume: 92.5 mL. Cortex is slightly echogenic. Moderate hydronephrosis. No mass. Left Kidney: Renal measurements: 8.6 x 5 4 x 4 cm = volume: 94.3 mL. Cortex appears slightly echogenic. Moderate hydronephrosis. No mass Bladder: Bladder appears slightly thick-walled. There is moderate debris in the bladder. Other: None. IMPRESSION: 1. Kidneys are slightly echogenic. There is moderate bilateral hydronephrosis. 2. Slightly thick-walled appearing urinary bladder with moderate debris Electronically Signed   By: Donavan Foil M.D.   On: 07/27/2021 19:11   DG Chest Port 1 View  Result Date: 07/27/2021 CLINICAL DATA:  dyspnea EXAM: PORTABLE CHEST 1 VIEW COMPARISON:  July 27, 2021. FINDINGS: Mild bibasilar predominant interstitial opacities, favored chronic. No confluent consolidation. No evidence of pleural effusions when correlating with same day CT abdomen/pelvis. Blunting of the right costophrenic sulcus, chronic and likely from scarring. No visible pneumothorax. Chronically prominent right paratracheal stripe, likely vascular. Cardiomediastinal silhouette is unchanged.  Calcified breast implants. IMPRESSION: Mild bibasilar predominant interstitial opacities, favored chronic. No definite acute cardiopulmonary disease. Electronically Signed   By: Margaretha Sheffield M.D.   On: 07/27/2021 18:04   DG Chest Port 1 View  Result Date: 07/27/2021 CLINICAL DATA:  Shortness of breath, dizziness, blurred vision for 2 days. EXAM: PORTABLE CHEST 1 VIEW COMPARISON:  CT chest 06/10/2020 FINDINGS: Stable elevation of the right hemidiaphragm. Heart is normal in size. Lungs are clear. No pleural effusion or pneumothorax. Biapical pleural thickening. No acute osseous abnormality. Breast implants. IMPRESSION: No acute cardiopulmonary abnormality. Electronically Signed   By: Ileana Roup M.D.   On: 07/27/2021 11:03     Discharge Exam: Vitals:   07/31/21 1000 07/31/21 1135  BP:    Pulse:    Resp: 18   Temp:  98.2 F (36.8 C)  SpO2:     Vitals:   07/31/21 0800 07/31/21 0900 07/31/21 1000 07/31/21 1135  BP:      Pulse:      Resp: 18 (!) 22 18   Temp:    98.2 F (36.8 C)  TempSrc:    Oral  SpO2:      Weight:      Height:  General: Pt is alert, awake, not in acute distress Cardiovascular: RRR, S1/S2 +, no rubs, no gallops Respiratory: CTA bilaterally, no wheezing, no rhonchi Abdominal: Soft, NT, ND, bowel sounds + Extremities: no edema, no cyanosis    The results of significant diagnostics from this hospitalization (including imaging, microbiology, ancillary and laboratory) are listed below for reference.     Microbiology: Recent Results (from the past 240 hour(s))  Resp Panel by RT-PCR (Flu A&B, Covid) Nasopharyngeal Swab     Status: None   Collection Time: 07/27/21 12:40 PM   Specimen: Nasopharyngeal Swab; Nasopharyngeal(NP) swabs in vial transport medium  Result Value Ref Range Status   SARS Coronavirus 2 by RT PCR NEGATIVE NEGATIVE Final    Comment: (NOTE) SARS-CoV-2 target nucleic acids are NOT DETECTED.  The SARS-CoV-2 RNA is generally detectable  in upper respiratory specimens during the acute phase of infection. The lowest concentration of SARS-CoV-2 viral copies this assay can detect is 138 copies/mL. A negative result does not preclude SARS-Cov-2 infection and should not be used as the sole basis for treatment or other patient management decisions. A negative result may occur with  improper specimen collection/handling, submission of specimen other than nasopharyngeal swab, presence of viral mutation(s) within the areas targeted by this assay, and inadequate number of viral copies(<138 copies/mL). A negative result must be combined with clinical observations, patient history, and epidemiological information. The expected result is Negative.  Fact Sheet for Patients:  EntrepreneurPulse.com.au  Fact Sheet for Healthcare Providers:  IncredibleEmployment.be  This test is no t yet approved or cleared by the [place] FDA and  has been authorized for detection and/or diagnosis of SARS-CoV-2 by FDA under an Emergency Use Authorization (EUA). This EUA will remain  in effect (meaning this test can be used) for the duration of the COVID-19 declaration under Section 564(b)(1) of the Act, 21 U.S.C.section 360bbb-3(b)(1), unless the authorization is terminated  or revoked sooner.       Influenza A by PCR NEGATIVE NEGATIVE Final   Influenza B by PCR NEGATIVE NEGATIVE Final    Comment: (NOTE) The Xpert Xpress SARS-CoV-2/FLU/RSV plus assay is intended as an aid in the diagnosis of influenza from Nasopharyngeal swab specimens and should not be used as a sole basis for treatment. Nasal washings and aspirates are unacceptable for Xpert Xpress SARS-CoV-2/FLU/RSV testing.  Fact Sheet for Patients: EntrepreneurPulse.com.au  Fact Sheet for Healthcare Providers: IncredibleEmployment.be  This test is not yet approved or cleared by the [place] FDA and has  been authorized for detection and/or diagnosis of SARS-CoV-2 by FDA under an Emergency Use Authorization (EUA). This EUA will remain in effect (meaning this test can be used) for the duration of the COVID-19 declaration under Section 564(b)(1) of the Act, 21 U.S.C. section 360bbb-3(b)(1), unless the authorization is terminated or revoked.  Performed at [facility], [address] [place], [place] [address]   Blood Culture (routine x 2)     Status: None (Preliminary result)   Collection Time: 07/27/21 12:40 PM   Specimen: BLOOD LEFT WRIST  Result Value Ref Range Status   Specimen Description   Final    BLOOD LEFT WRIST Performed at [facility], [address] [place], [place] [address]    Special Requests   Final    BOTTLES DRAWN AEROBIC ONLY Blood Culture adequate volume Performed at [facility], [address] [place], [place] [address]    Culture   Final  NO GROWTH 4 DAYS Performed at [facility], [place] [address], [place], [place] [address]    Report Status PENDING  Incomplete  MRSA Next Gen by PCR, Nasal     Status: None   Collection Time: 07/27/21  4:03 PM   Specimen: Nasal Mucosa; Nasal Swab  Result Value Ref Range Status   MRSA by PCR Next Gen NOT DETECTED NOT DETECTED Final    Comment: (NOTE) The GeneXpert MRSA Assay (FDA approved for NASAL specimens only), is one component of a comprehensive MRSA colonization surveillance program. It is not intended to diagnose MRSA infection nor to guide or monitor treatment for MRSA infections. Test performance is not FDA approved in patients less than 59 years old. Performed at [facility], [place] [address] [place], [place] [address]   Urine Culture     Status: Abnormal   Collection Time: 07/27/21 10:28 PM   Specimen: Urine, Clean Catch  Result Value Ref Range Status   Specimen Description URINE, CLEAN CATCH  Final   Special Requests   Final    Normal Performed at [facility], [place] [address], [place], [place] [address]    Culture MULTIPLE SPECIES PRESENT, SUGGEST RECOLLECTION (A)  Final   Report Status 07/28/2021 FINAL  Final     Labs: BNP (last 3 results) Recent Labs    07/27/21 1053  BNP 169.6*   Basic Metabolic Panel: Recent Labs  Lab 07/28/21 0046 07/28/21 0407 07/28/21 1606 07/29/21 0013 07/29/21 0902 07/29/21 1244 07/30/21 0337 07/30/21 0817 07/30/21 1153 07/31/21 0211  NA 138   < > 137 138 137 131*  --  135  --  138  K 2.2*   < > 3.3* 3.6 4.1 3.8  --  3.4*  --  3.7  CL 111   < > 110 113* 113* 109  --  105  --  111  CO2 14*   < > 16* 13* 15* 11*  --  18*  --  19*  GLUCOSE 126*   < > 118* 118* 141* 163*  --  149*  --  105*  BUN 81*   < > 70* 67* 62* 62*  --  56*  --  54*  CREATININE 8.17*   < > 7.10* 6.56* 6.40* 6.22*  --  5.67*  --  4.85*  CALCIUM 5.7*   < > 6.0* 6.4* 7.4* 6.9*  --  7.1*  --  7.3*  MG 1.0*  --  2.1 2.0  --   --  1.9  --   --  1.6*  PHOS 6.8*  --   --  5.2*  --   --  4.3  --  4.8* 4.9*  5.0*   < > = values in this interval not displayed.   Liver Function Tests: Recent Labs  Lab 07/27/21 1053 07/31/21 0211  AST 14*  --   ALT 13  --   ALKPHOS 91  --   BILITOT 0.8  --   PROT 6.6  --   ALBUMIN 3.2* 2.0*   No results for input(s): LIPASE, AMYLASE in the last 168 hours. No results for input(s): AMMONIA in the last 168 hours. CBC: Recent Labs  Lab 07/27/21 1053 07/27/21 1104 07/28/21 0046 07/28/21 0407 07/28/21 1606 07/29/21 0013 07/30/21 0817 07/30/21 1153  WBC 10.6*  --  6.8  --  8.6 8.1 7.9  --   NEUTROABS 8.8*  --   --   --   --   --   --   --  HGB 9.6*   < > 7.7* 6.5* 7.1* 7.1* 7.5* 7.7*  HCT 28.6*   < > 21.9* 19.0* 20.8* 20.8* 22.2* 22.8*  MCV 98.6  --  94.0  --  95.0 95.0 95.7  --   PLT 381  --  302  --  279 278 299  --    < > = values in this interval not displayed.   Cardiac Enzymes: Recent Labs  Lab 07/27/21 1053  CKTOTAL 229   BNP: Invalid input(s): POCBNP CBG: Recent  Labs  Lab 07/28/21 0725 07/28/21 1119 07/28/21 1511 07/28/21 1914 07/28/21 2346  GLUCAP 99 119* 105* 107* 123*   D-Dimer No results for input(s): DDIMER in the last 72 hours. Hgb A1c No results for input(s): HGBA1C in the last 72 hours. Lipid Profile No results for input(s): CHOL, HDL, LDLCALC, TRIG, CHOLHDL, LDLDIRECT in the last 72 hours. Thyroid function studies No results for input(s): TSH, T4TOTAL, T3FREE, THYROIDAB in the last 72 hours.  Invalid input(s): FREET3 Anemia work up No results for input(s): VITAMINB12, FOLATE, FERRITIN, TIBC, IRON, RETICCTPCT in the last 72 hours. Urinalysis    Component Value Date/Time   COLORURINE YELLOW 07/27/2021 2228   APPEARANCEUR TURBID (A) 07/27/2021 2228   LABSPEC 1.009 07/27/2021 2228   PHURINE 5.0 07/27/2021 2228   GLUCOSEU NEGATIVE 07/27/2021 2228   HGBUR MODERATE (A) 07/27/2021 2228   BILIRUBINUR NEGATIVE 07/27/2021 2228   KETONESUR NEGATIVE 07/27/2021 2228   PROTEINUR 100 (A) 07/27/2021 2228   UROBILINOGEN 0.2 04/24/2012 1345   NITRITE NEGATIVE 07/27/2021 2228   LEUKOCYTESUR LARGE (A) 07/27/2021 2228   Sepsis Labs Invalid input(s): PROCALCITONIN,  WBC,  LACTICIDVEN Microbiology Recent Results (from the past 240 hour(s))  Resp Panel by RT-PCR (Flu A&B, Covid) Nasopharyngeal Swab     Status: None   Collection Time: 07/27/21 12:40 PM   Specimen: Nasopharyngeal Swab; Nasopharyngeal(NP) swabs in vial transport medium  Result Value Ref Range Status   SARS Coronavirus 2 by RT PCR NEGATIVE NEGATIVE Final    Comment: (NOTE) SARS-CoV-2 target nucleic acids are NOT DETECTED.  The SARS-CoV-2 RNA is generally detectable in upper respiratory specimens during the acute phase of infection. The lowest concentration of SARS-CoV-2 viral copies this assay can detect is 138 copies/mL. A negative result does not preclude SARS-Cov-2 infection and should not be used as the sole basis for treatment or other patient management decisions. A  negative result may occur with  improper specimen collection/handling, submission of specimen other than nasopharyngeal swab, presence of viral mutation(s) within the areas targeted by this assay, and inadequate number of viral copies(<138 copies/mL). A negative result must be combined with clinical observations, patient history, and epidemiological information. The expected result is Negative.  Fact Sheet for Patients:  EntrepreneurPulse.com.au  Fact Sheet for Healthcare Providers:  IncredibleEmployment.be  This test is no t yet approved or cleared by the [place] FDA and  has been authorized for detection and/or diagnosis of SARS-CoV-2 by FDA under an Emergency Use Authorization (EUA). This EUA will remain  in effect (meaning this test can be used) for the duration of the COVID-19 declaration under Section 564(b)(1) of the Act, 21 U.S.C.section 360bbb-3(b)(1), unless the authorization is terminated  or revoked sooner.       Influenza A by PCR NEGATIVE NEGATIVE Final   Influenza B by PCR NEGATIVE NEGATIVE Final    Comment: (NOTE) The Xpert Xpress SARS-CoV-2/FLU/RSV plus assay is intended as an aid in the diagnosis of influenza from Nasopharyngeal swab specimens and  should not be used as a sole basis for treatment. Nasal washings and aspirates are unacceptable for Xpert Xpress SARS-CoV-2/FLU/RSV testing.  Fact Sheet for Patients: EntrepreneurPulse.com.au  Fact Sheet for Healthcare Providers: IncredibleEmployment.be  This test is not yet approved or cleared by the [place] FDA and has been authorized for detection and/or diagnosis of SARS-CoV-2 by FDA under an Emergency Use Authorization (EUA). This EUA will remain in effect (meaning this test can be used) for the duration of the COVID-19 declaration under Section 564(b)(1) of the Act, 21 U.S.C. section 360bbb-3(b)(1), unless the authorization  is terminated or revoked.  Performed at [facility], [address] [place], [place] [address]   Blood Culture (routine x 2)     Status: None (Preliminary result)   Collection Time: 07/27/21 12:40 PM   Specimen: BLOOD LEFT WRIST  Result Value Ref Range Status   Specimen Description   Final    BLOOD LEFT WRIST Performed at [facility], [address] [place], [place] [address]    Special Requests   Final    BOTTLES DRAWN AEROBIC ONLY Blood Culture adequate volume Performed at [facility], [address] [place], [place] [address]    Culture   Final    NO GROWTH 4 DAYS Performed at [facility], [place] [address], [place], [place] [address]    Report Status PENDING  Incomplete  MRSA Next Gen by PCR, Nasal     Status: None   Collection Time: 07/27/21  4:03 PM   Specimen: Nasal Mucosa; Nasal Swab  Result Value Ref Range Status   MRSA by PCR Next Gen NOT DETECTED NOT DETECTED Final    Comment: (NOTE) The GeneXpert MRSA Assay (FDA approved for NASAL specimens only), is one component of a comprehensive MRSA colonization surveillance program. It is not intended to diagnose MRSA infection nor to guide or monitor treatment for MRSA infections. Test performance is not FDA approved in patients less than 73 years old. Performed at [facility], [place] [address], [place], [place] [address]   Urine Culture     Status: Abnormal   Collection Time: 07/27/21 10:28 PM   Specimen: Urine, Clean Catch  Result Value Ref Range Status   Specimen Description URINE, CLEAN CATCH  Final   Special Requests   Final    Normal Performed at [facility], [place] [address], [place], [place] [address]    Culture MULTIPLE SPECIES PRESENT, SUGGEST RECOLLECTION (A)  Final   Report Status 07/28/2021 FINAL  Final     Time coordinating discharge: Over 30 minutes  SIGNED:   Darliss Cheney, MD  [facility] 07/31/2021, 11:53 AM  If 7PM-7AM, please  contact night-coverage [email or web address]

## 2021-08-01 LAB — CULTURE, BLOOD (ROUTINE X 2)
Culture: NO GROWTH
Special Requests: ADEQUATE

## 2021-10-27 ENCOUNTER — Ambulatory Visit: Payer: Medicare HMO | Admitting: Physician Assistant

## 2021-11-21 ENCOUNTER — Encounter (HOSPITAL_BASED_OUTPATIENT_CLINIC_OR_DEPARTMENT_OTHER): Payer: Self-pay | Admitting: *Deleted

## 2021-11-21 ENCOUNTER — Emergency Department (HOSPITAL_BASED_OUTPATIENT_CLINIC_OR_DEPARTMENT_OTHER): Payer: Medicare HMO

## 2021-11-21 ENCOUNTER — Emergency Department (HOSPITAL_BASED_OUTPATIENT_CLINIC_OR_DEPARTMENT_OTHER)
Admission: EM | Admit: 2021-11-21 | Discharge: 2021-11-21 | Disposition: A | Discharge status: Home / Self Care | Payer: Medicare HMO | Attending: Emergency Medicine | Admitting: Emergency Medicine

## 2021-11-21 ENCOUNTER — Other Ambulatory Visit: Payer: Self-pay

## 2021-11-21 DIAGNOSIS — Z85118 Personal history of other malignant neoplasm of bronchus and lung: Secondary | ICD-10-CM | POA: Insufficient documentation

## 2021-11-21 DIAGNOSIS — E871 Hypo-osmolality and hyponatremia: Secondary | ICD-10-CM | POA: Diagnosis not present

## 2021-11-21 DIAGNOSIS — J45909 Unspecified asthma, uncomplicated: Secondary | ICD-10-CM | POA: Diagnosis not present

## 2021-11-21 DIAGNOSIS — Z20822 Contact with and (suspected) exposure to covid-19: Secondary | ICD-10-CM | POA: Insufficient documentation

## 2021-11-21 DIAGNOSIS — E875 Hyperkalemia: Secondary | ICD-10-CM

## 2021-11-21 DIAGNOSIS — Z992 Dependence on renal dialysis: Secondary | ICD-10-CM

## 2021-11-21 DIAGNOSIS — Z8543 Personal history of malignant neoplasm of ovary: Secondary | ICD-10-CM | POA: Diagnosis not present

## 2021-11-21 DIAGNOSIS — R112 Nausea with vomiting, unspecified: Secondary | ICD-10-CM

## 2021-11-21 DIAGNOSIS — N186 End stage renal disease: Secondary | ICD-10-CM | POA: Insufficient documentation

## 2021-11-21 DIAGNOSIS — N3090 Cystitis, unspecified without hematuria: Secondary | ICD-10-CM

## 2021-11-21 HISTORY — DX: Dependence on renal dialysis: Z99.2

## 2021-11-21 LAB — COMPREHENSIVE METABOLIC PANEL
ALT: 29 U/L (ref 0–44)
AST: 26 U/L (ref 15–41)
Albumin: 3.5 g/dL (ref 3.5–5.0)
Alkaline Phosphatase: 105 U/L (ref 38–126)
Anion gap: 19 — ABNORMAL HIGH (ref 5–15)
BUN: 45 mg/dL — ABNORMAL HIGH (ref 8–23)
CO2: 17 mmol/L — ABNORMAL LOW (ref 22–32)
Calcium: 7.6 mg/dL — ABNORMAL LOW (ref 8.9–10.3)
Chloride: 93 mmol/L — ABNORMAL LOW (ref 98–111)
Creatinine, Ser: 6.82 mg/dL — ABNORMAL HIGH (ref 0.44–1.00)
GFR, Estimated: 6 mL/min — ABNORMAL LOW (ref >60)
Glucose, Bld: 129 mg/dL — ABNORMAL HIGH (ref 70–99)
Potassium: 5.3 mmol/L — ABNORMAL HIGH (ref 3.5–5.1)
Sodium: 129 mmol/L — ABNORMAL LOW (ref 135–145)
Total Bilirubin: 0.8 mg/dL (ref 0.3–1.2)
Total Protein: 7.2 g/dL (ref 6.5–8.1)

## 2021-11-21 LAB — RESP PANEL BY RT-PCR (FLU A&B, COVID) ARPGX2
Influenza A by PCR: NEGATIVE
Influenza B by PCR: NEGATIVE
SARS Coronavirus 2 by RT PCR: NEGATIVE

## 2021-11-21 LAB — LIPASE, BLOOD: Lipase: 66 U/L — ABNORMAL HIGH (ref 11–51)

## 2021-11-21 LAB — URINALYSIS, ROUTINE W REFLEX MICROSCOPIC
Bilirubin Urine: NEGATIVE
Glucose, UA: NEGATIVE mg/dL
Ketones, ur: NEGATIVE mg/dL
Nitrite: NEGATIVE
Protein, ur: 100 mg/dL — AB
Specific Gravity, Urine: 1.02 (ref 1.005–1.030)
pH: 6 (ref 5.0–8.0)

## 2021-11-21 LAB — URINALYSIS, MICROSCOPIC (REFLEX)

## 2021-11-21 LAB — CBC
HCT: 24 % — ABNORMAL LOW (ref 36.0–46.0)
Hemoglobin: 8.1 g/dL — ABNORMAL LOW (ref 12.0–15.0)
MCH: 31.9 pg (ref 26.0–34.0)
MCHC: 33.8 g/dL (ref 30.0–36.0)
MCV: 94.5 fL (ref 80.0–100.0)
Platelets: 349 10*3/uL (ref 150–400)
RBC: 2.54 MIL/uL — ABNORMAL LOW (ref 3.87–5.11)
RDW: 15.3 % (ref 11.5–15.5)
WBC: 12.7 10*3/uL — ABNORMAL HIGH (ref 4.0–10.5)
nRBC: 0 % (ref 0.0–0.2)

## 2021-11-21 MED ORDER — LACTATED RINGERS IV BOLUS
1000.0000 mL | Freq: Once | INTRAVENOUS | Status: AC
Start: 2021-11-21 — End: 2021-11-21
  Administered 2021-11-21: 1000 mL via INTRAVENOUS

## 2021-11-21 MED ORDER — ONDANSETRON HCL 4 MG/2ML IJ SOLN
4.0000 mg | Freq: Once | INTRAMUSCULAR | Status: AC
  Administered 2021-11-21: 4 mg via INTRAVENOUS
  Filled 2021-11-21: qty 2

## 2021-11-21 MED ORDER — SODIUM ZIRCONIUM CYCLOSILICATE 5 G PO PACK
5.0000 g | PACK | Freq: Once | ORAL | Status: AC
Start: 2021-11-21 — End: 2021-11-21
  Administered 2021-11-21: 5 g via ORAL
  Filled 2021-11-21: qty 1

## 2021-11-21 MED ORDER — SUCRALFATE 1 G PO TABS: 1.0000 g | ORAL_TABLET | Freq: Three times a day (TID) | ORAL | 0 refills | Status: AC

## 2021-11-21 MED ORDER — CEPHALEXIN 500 MG PO CAPS
500.0000 mg | ORAL_CAPSULE | Freq: Two times a day (BID) | ORAL | 0 refills | Status: AC
Start: 2021-11-21 — End: 2021-11-28

## 2021-11-21 NOTE — Discharge Instructions (Addendum)
It was a pleasure caring for you today in the emergency department. ° °Please return to the emergency department for any worsening or worrisome symptoms. ° ° °

## 2021-11-21 NOTE — ED Provider Notes (Signed)
Care of the patient assumed at the change of shift. Here with abdominal pain, thought she may have bowel obstruction. Pending CT.  Physical Exam  BP 134/67    Pulse 76    Temp (!) 97.4 F (36.3 C) (Oral)    Resp 16    Ht 5\' 4"  (1.626 m)    Wt 53.1 kg    SpO2 100%    BMI 20.09 kg/m   Physical Exam  Procedures  Procedures  ED Course / MDM   Clinical Course as of 11/21/21 1638  Mon Nov 21, 2021  1634 UA is concerning for infection. She still makes urine daily despite ESRD. She has had some lower abdominal discomfort. No dysuria. Will send culture, begin Keflex. She is scheduled for dialysis tomorrow. IVF stopped after 500cc.  I personally viewed the images from radiology studies and agree with radiologist interpretation: no bowel obstruction. She is feeling better and reports stool is in her ostomy. She is ready to go home. Outpatient follow up with dialysis and Gen Surg.   [CS]  4193 Given presenting complaint, I considered that admission might be necessary. After review of results from ED lab and/or imaging studies, admission to the hospital is not indicated at this time.   [CS]    Clinical Course User Index [CS] Truddie Hidden, MD   Medical Decision Making Problems Addressed: Cystitis: complicated acute illness or injury Hyperkalemia: acute illness or injury that poses a threat to life or bodily functions  Amount and/or Complexity of Data Reviewed Labs: ordered. Decision-making details documented in ED Course. Radiology: ordered and independent interpretation performed. Decision-making details documented in ED Course.  Risk Prescription drug management. Decision regarding hospitalization.          Truddie Hidden, MD 11/21/21 [phone]

## 2021-11-21 NOTE — ED Provider Notes (Signed)
[facility] EMERGENCY DEPARTMENT Provider Note   CSN: [id number] Arrival date & time: 11/21/21  1155     History  Chief Complaint  Patient presents with   Abdominal Pain   Emesis    Desiree Adams is a 76 y.o. female.  This is a 76 y.o. female with significant medical history as below, including colostomy, CVA, ESRD on HD Tuesday Thursday Saturday with last dialysis Saturday who presents to the ED with complaint of abdominal discomfort, nausea and vomiting.  Patient is concerned that potentially she has another small bowel obstruction.  She had reduced output to her ostomy bag over the past 2 days.  Nausea vomiting /  reduced oral intake.  Generalized abdominal discomfort.  Patient does report while in the lobby her ostomy spontaneously filled with stool and she felt much better.  Follows with [facility].  Last dialysis was 2 days ago, full session.  No further nausea.  No fevers, chills, chest pain or dyspnea.  No recent travel or sick contacts.  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, bronchus, lung, malignant (HCC) No date: Dialysis patient [facility]) No date: Difficult intubation No date: GERD (gastroesophageal reflux disease) No date: History of Clostridium difficile infection 01/2001: Other and unspecified noninfectious gastroenteritis and  colitis(558.9)     Comment:  nonspecific colitis most likely from radiation No date: Ovarian cancer ([place]) No date: PONV (postoperative nausea and vomiting)     Comment:  always nausea and  vomiting No date: Renal disorder No date: Right lower lobe lung mass No date: Small bowel obstruction (HCC) No date: Stroke ([place]) No date: Urinary tract infection No date: Vitamin B12 deficiency  Past Surgical History: No date: APPENDECTOMY No date: BREAST ENHANCEMENT SURGERY 12/25/2011: COLONOSCOPY     Comment:   Procedure: COLONOSCOPY;  Surgeon: Lafayette Dragon, MD;                Location: [facility] ENDOSCOPY;  Service: Endoscopy;  Laterality:              N/A; 2013: COLOSTOMY No date: EXPLORATORY LAPAROTOMY W/ BOWEL RESECTION 06/04/2013: HEMORRHOID SURGERY; N/A     Comment:  Procedure: single coulum HEMORRHOIDECTOMY;  Surgeon:               Odis Hollingshead, MD;  Location: [facility] ORS;  Service:               General;  Laterality: N/A; No date: LAPAROSCOPIC CHOLECYSTECTOMY No date: LUNG REMOVAL, PARTIAL No date: rectum removal No date: TOTAL ABDOMINAL HYSTERECTOMY W/ BILATERAL SALPINGOOPHORECTOMY 04/25/2012: VIDEO ASSISTED THORACOSCOPY     Comment:  right    The history is provided by the patient. No language interpreter was used.  Abdominal Pain Associated symptoms: nausea and vomiting   Associated symptoms: no chest pain, no cough, no dysuria, no fever and no shortness of breath   Emesis Associated symptoms: abdominal pain   Associated symptoms: no cough, no fever and no headaches       Home Medications Prior to Admission medications   Medication Sig Start Date End Date Taking? Authorizing Provider  sucralfate (CARAFATE) 1 g tablet Take 1 tablet (1 g total) by mouth 4 (four) times daily -  with meals and at bedtime for 7 days. 11/21/21 11/28/21 Yes Jeanell Sparrow, DO  cholestyramine Lucrezia Starch) 4 g packet Take 1 packet by  mouth 3 (three) times daily. 06/23/21   [provider]  famotidine (PEPCID) 20 MG tablet Take 20 mg by mouth daily.    [provider]  Multiple Vitamin (MULTI-VITAMIN PO) Take 5 mLs by mouth See admin instructions. Mix 5 mls multivitamin vegetable powder in water and drink    [provider]  ondansetron (ZOFRAN ODT) 4 MG disintegrating tablet 4mg  ODT q4 hours prn nausea/vomit Patient taking differently: Take 4 mg by mouth every 4 (four) hours as needed for nausea or vomiting. 03/05/19   Mesner, Corene Cornea, MD  polyvinyl alcohol (LIQUIFILM TEARS) 1.4 %  ophthalmic solution Place 1 drop into both eyes at bedtime as needed for dry eyes.    [provider]      Allergies    Other    Review of Systems   Review of Systems  Constitutional:  Positive for appetite change. Negative for activity change and fever.  HENT:  Negative for facial swelling and trouble swallowing.   Eyes:  Negative for discharge and redness.  Respiratory:  Negative for cough and shortness of breath.   Cardiovascular:  Negative for chest pain and palpitations.  Gastrointestinal:  Positive for abdominal pain, nausea and vomiting.  Genitourinary:  Negative for dysuria and flank pain.  Musculoskeletal:  Negative for back pain and gait problem.  Skin:  Negative for pallor and rash.  Neurological:  Negative for syncope and headaches.   Physical Exam Updated Vital Signs BP 134/67    Pulse 76    Temp (!) 97.4 F (36.3 C) (Oral)    Resp 16    Ht 5\' 4"  (1.626 m)    Wt 53.1 kg    SpO2 100%    BMI 20.09 kg/m  Physical Exam Vitals and nursing note reviewed.  Constitutional:      General: She is not in acute distress.    Appearance: Normal appearance. She is well-developed. She is not ill-appearing, toxic-appearing or diaphoretic.  HENT:     Head: Normocephalic and atraumatic.     Right Ear: External ear normal.     Left Ear: External ear normal.     Nose: Nose normal.     Mouth/Throat:     Mouth: Mucous membranes are moist.  Eyes:     General: No scleral icterus.       Right eye: No discharge.        Left eye: No discharge.  Cardiovascular:     Rate and Rhythm: Normal rate and regular rhythm.     Pulses: Normal pulses.     Heart sounds: Normal heart sounds.  Pulmonary:     Effort: Pulmonary effort is normal. No respiratory distress.     Breath sounds: Normal breath sounds.  Chest:       Comments: Dressing is clean, dry, intact to dialysis access.  No cellulitic changes.  Nontender to palpation. Abdominal:     General: Abdomen is flat.     Tenderness:  There is no abdominal tenderness.     Comments: Ostomy bag with large amount of brown stool  Musculoskeletal:        General: Normal range of motion.     Cervical back: Normal range of motion.     Right lower leg: No edema.     Left lower leg: No edema.  Skin:    General: Skin is warm and dry.     Capillary Refill: Capillary refill takes less than 2 seconds.  Neurological:     Mental Status:  She is alert.  Psychiatric:        Mood and Affect: Mood normal.        Behavior: Behavior normal.    ED Results / Procedures / Treatments   Labs (all labs ordered are listed, but only abnormal results are displayed) Labs Reviewed  LIPASE, BLOOD - Abnormal; Notable for the following components:      Result Value   Lipase 66 (*)    All other components within normal limits  COMPREHENSIVE METABOLIC PANEL - Abnormal; Notable for the following components:   Sodium 129 (*)    Potassium 5.3 (*)    Chloride 93 (*)    CO2 17 (*)    Glucose, Bld 129 (*)    BUN 45 (*)    Creatinine, Ser 6.82 (*)    Calcium 7.6 (*)    GFR, Estimated 6 (*)    Anion gap 19 (*)    All other components within normal limits  CBC - Abnormal; Notable for the following components:   WBC 12.7 (*)    RBC 2.54 (*)    Hemoglobin 8.1 (*)    HCT 24.0 (*)    All other components within normal limits  RESP PANEL BY RT-PCR (FLU A&B, COVID) [id number]  URINALYSIS, ROUTINE W REFLEX MICROSCOPIC    EKG EKG Interpretation  Date/Time:  Monday November 21 2021 14:06:50 EST Ventricular Rate:  90 PR Interval:  164 QRS Duration: 80 QT Interval:  357 QTC Calculation: 437 R Axis:   20 Text Interpretation: Sinus rhythm no stemi peaked t waves similar to prior 10/22 similar to prior Confirmed by Wynona Dove ([id number]) on 11/21/2021 2:08:49 PM  Radiology No results found.  Procedures Procedures    Medications Ordered in ED Medications  lactated ringers bolus 1,000 mL (1,000 mLs Intravenous New Bag/Given 11/21/21 1427)   ondansetron (ZOFRAN) injection 4 mg (4 mg Intravenous Given 11/21/21 1425)  sodium zirconium cyclosilicate (LOKELMA) packet 5 g (5 g Oral Given 11/21/21 1535)    ED Course/ Medical Decision Making/ A&P                           Medical Decision Making Amount and/or Complexity of Data Reviewed External Data Reviewed: labs, radiology and notes. Labs: ordered. Decision-making details documented in ED Course. Radiology: ordered. ECG/medicine tests: ordered and independent interpretation performed. Decision-making details documented in ED Course.  Risk Prescription drug management.    CC: Abdominal pain, nausea, vomiting  This patient presents to the Emergency Department for the above complaint. This involves an extensive number of treatment options and is a complaint that carries with it a high risk of complications and morbidity. Vital signs were reviewed. Serious etiologies considered.  Record review:  Previous records obtained and reviewed   Medical and surgical history as noted above.   Work up as above, notable for:  Labs & imaging results that were available during my care of the patient were reviewed by me and considered in my medical decision making.   I ordered imaging studies which included CT abdomen pelvis without contrast and is pending at time of handoff.  Cardiac monitoring reviewed and interpreted personally which shows normal sinus rhythm  Social determinants of health include - N/a  Labs reviewed, hemoglobin is at her approximate baseline.  Mild hyponatremia 129, mild hyperkalemia at 5.3.  Creatinine today is at her approximate baseline.   Management: Given IV fluids, Zofran, Lokelma  Reassessment:  Pt reports she  is feeling much better after relief to her colostomy bag. No further n/v. She is pending CT at this time. She may have intermittent obstruction that was relieved in the lobby. She follows with gen surg in [place]. If CT negative and able to tolerate PO  would recommend DC and o/p f/u w/ her surgeon. She is HDS currently.        This chart was dictated using voice recognition software.  Despite best efforts to proofread,  errors can occur which can change the documentation meaning.         Final Clinical Impression(s) / ED Diagnoses Final diagnoses:  Hemodialysis patient ([place])  Nausea and vomiting, unspecified vomiting type  Hyperkalemia    Rx / DC Orders ED Discharge Orders          Ordered    sucralfate (CARAFATE) 1 g tablet  3 times daily with meals & bedtime        11/21/21 1555              Jeanell Sparrow, DO 11/21/21 1556

## 2021-11-21 NOTE — ED Notes (Signed)
EKG given to Dr. Pearline Cables

## 2021-11-21 NOTE — ED Triage Notes (Signed)
Abdominal pain 3 days. States she feels she has a SBO. Vomiting. She feels dehydrated.

## 2023-09-02 DEATH — deceased
